# Patient Record
Sex: Female | Born: 1955
Health system: Southern US, Community
[De-identification: ages and names within clinical notes are randomized; demographics above are authoritative.]

## PROBLEM LIST (undated history)

## (undated) DIAGNOSIS — E039 Hypothyroidism, unspecified: Secondary | ICD-10-CM

## (undated) DIAGNOSIS — M1990 Unspecified osteoarthritis, unspecified site: Secondary | ICD-10-CM

## (undated) DIAGNOSIS — N6091 Unspecified benign mammary dysplasia of right breast: Principal | ICD-10-CM

## (undated) DIAGNOSIS — R112 Nausea with vomiting, unspecified: Secondary | ICD-10-CM

## (undated) DIAGNOSIS — K219 Gastro-esophageal reflux disease without esophagitis: Secondary | ICD-10-CM

## (undated) DIAGNOSIS — IMO0002 Reserved for concepts with insufficient information to code with codable children: Secondary | ICD-10-CM

## (undated) DIAGNOSIS — I10 Essential (primary) hypertension: Secondary | ICD-10-CM

## (undated) DIAGNOSIS — D649 Anemia, unspecified: Secondary | ICD-10-CM

## (undated) DIAGNOSIS — G4730 Sleep apnea, unspecified: Secondary | ICD-10-CM

## (undated) DIAGNOSIS — R519 Headache, unspecified: Secondary | ICD-10-CM

## (undated) DIAGNOSIS — K224 Dyskinesia of esophagus: Secondary | ICD-10-CM

## (undated) DIAGNOSIS — T7840XA Allergy, unspecified, initial encounter: Secondary | ICD-10-CM

## (undated) DIAGNOSIS — Z9889 Other specified postprocedural states: Secondary | ICD-10-CM

## (undated) HISTORY — PX: OTHER SURGICAL HISTORY: SHX169

## (undated) HISTORY — DX: Hypothyroidism, unspecified: E03.9

## (undated) HISTORY — DX: Anemia, unspecified: D64.9

## (undated) HISTORY — PX: KNEE ARTHROSCOPY: SHX127

## (undated) HISTORY — PX: COSMETIC SURGERY: SHX468

## (undated) HISTORY — PX: JOINT REPLACEMENT: SHX530

## (undated) HISTORY — PX: WISDOM TOOTH EXTRACTION: SHX21

## (undated) HISTORY — DX: Unspecified benign mammary dysplasia of right breast: N60.91

## (undated) HISTORY — DX: Reserved for concepts with insufficient information to code with codable children: IMO0002

## (undated) HISTORY — DX: Allergy, unspecified, initial encounter: T78.40XA

---

## 1982-12-16 HISTORY — PX: CHOLECYSTECTOMY: SHX55

## 2002-12-16 HISTORY — PX: GASTRIC BYPASS: SHX52

## 2010-04-30 ENCOUNTER — Emergency Department: Payer: Self-pay | Admitting: Emergency Medicine

## 2010-05-04 ENCOUNTER — Ambulatory Visit: Payer: Self-pay | Admitting: Orthopedic Surgery

## 2010-05-08 ENCOUNTER — Inpatient Hospital Stay: Payer: Self-pay | Admitting: Orthopedic Surgery

## 2010-06-21 ENCOUNTER — Ambulatory Visit: Payer: Self-pay | Admitting: Orthopedic Surgery

## 2011-01-03 ENCOUNTER — Ambulatory Visit: Payer: Self-pay | Admitting: Orthopedic Surgery

## 2011-01-10 ENCOUNTER — Inpatient Hospital Stay: Payer: Self-pay | Admitting: Orthopedic Surgery

## 2011-01-15 LAB — PATHOLOGY REPORT

## 2011-12-17 DIAGNOSIS — C50919 Malignant neoplasm of unspecified site of unspecified female breast: Secondary | ICD-10-CM

## 2011-12-17 HISTORY — DX: Malignant neoplasm of unspecified site of unspecified female breast: C50.919

## 2012-10-30 DIAGNOSIS — R920 Mammographic microcalcification found on diagnostic imaging of breast: Secondary | ICD-10-CM | POA: Insufficient documentation

## 2012-11-15 HISTORY — PX: MM BREAST STEREO BX*L*R/S: HXRAD495

## 2012-11-28 ENCOUNTER — Encounter: Payer: Self-pay | Admitting: General Surgery

## 2012-11-28 DIAGNOSIS — E559 Vitamin D deficiency, unspecified: Secondary | ICD-10-CM | POA: Insufficient documentation

## 2012-11-28 DIAGNOSIS — R920 Mammographic microcalcification found on diagnostic imaging of breast: Secondary | ICD-10-CM

## 2012-11-28 DIAGNOSIS — E039 Hypothyroidism, unspecified: Secondary | ICD-10-CM

## 2012-12-07 ENCOUNTER — Ambulatory Visit: Payer: Self-pay | Admitting: General Surgery

## 2012-12-07 HISTORY — PX: BREAST SURGERY: SHX581

## 2012-12-07 HISTORY — PX: BREAST BIOPSY: SHX20

## 2012-12-08 LAB — PATHOLOGY REPORT

## 2012-12-16 DIAGNOSIS — N6091 Unspecified benign mammary dysplasia of right breast: Secondary | ICD-10-CM

## 2012-12-16 HISTORY — DX: Unspecified benign mammary dysplasia of right breast: N60.91

## 2012-12-16 HISTORY — PX: BREAST MASS EXCISION: SHX1267

## 2013-01-01 ENCOUNTER — Ambulatory Visit: Payer: Self-pay | Admitting: General Surgery

## 2013-01-01 HISTORY — PX: BREAST LUMPECTOMY: SHX2

## 2013-01-04 LAB — PATHOLOGY REPORT

## 2013-06-10 ENCOUNTER — Ambulatory Visit: Payer: Self-pay | Admitting: General Surgery

## 2013-06-14 ENCOUNTER — Encounter: Payer: Self-pay | Admitting: General Surgery

## 2013-06-22 ENCOUNTER — Encounter: Payer: Self-pay | Admitting: General Surgery

## 2013-06-22 ENCOUNTER — Ambulatory Visit (INDEPENDENT_AMBULATORY_CARE_PROVIDER_SITE_OTHER): Payer: 59 | Admitting: General Surgery

## 2013-06-22 VITALS — BP 144/92 | HR 70 | Resp 14 | Ht 67.0 in | Wt 237.0 lb

## 2013-06-22 DIAGNOSIS — N6099 Unspecified benign mammary dysplasia of unspecified breast: Secondary | ICD-10-CM | POA: Insufficient documentation

## 2013-06-22 DIAGNOSIS — N6089 Other benign mammary dysplasias of unspecified breast: Secondary | ICD-10-CM

## 2013-06-22 DIAGNOSIS — R920 Mammographic microcalcification found on diagnostic imaging of breast: Secondary | ICD-10-CM

## 2013-06-22 NOTE — Patient Instructions (Addendum)
Continue self breast exams. Call office for any new breast issues or concerns. Dec with bilateral mammogram and office visit

## 2013-06-22 NOTE — Progress Notes (Signed)
Patient ID: Kathy Mcintosh, female   DOB: Sep 18, 1956, 57 y.o.   MRN: [id number]  Chief Complaint  Patient presents with  . Other    mammogram    HPI FREDRICK DRAY is a 57 y.o. female here today following up from an right breast mammogram done on 06/10/13 cat 3. Patient dose perform self breast checks and get regular mammograms. No new breast issues. Continues to take her Tamoxifen. The patient has gained 7 pounds from her last visit. 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 HPI  Past Medical History  Diagnosis Date  . Hypothyroidism     Past Surgical History  Procedure Laterality Date  . Cholecystectomy  1984  . Knee arthroscopy  Right 2011, Left 2012  . Gastric bypass  2004  . Mm breast stereo bx*l*r/s  Dec 2013  . Breast mass excision Right Jan 2014    Family History  Problem Relation Age of Onset  . Lung cancer Mother   . Cervical cancer Mother   . Cervical cancer Sister   . Breast cancer Paternal Aunt 60  . Breast cancer Sister     Social History History  Substance Use Topics  . Smoking status: Never Smoker   . Smokeless tobacco: Never Used  . Alcohol Use: Yes    Allergies  Allergen Reactions  . Sulfa Antibiotics Nausea And Vomiting    Current Outpatient Prescriptions  Medication Sig Dispense Refill  . aspirin 81 MG tablet Take 81 mg by mouth daily.      Marland Kitchen b complex vitamins tablet Take 1 tablet by mouth daily.      . cholecalciferol (VITAMIN D) 1000 UNITS tablet Take 1,000 Units by mouth daily.      Marland Kitchen levothyroxine (SYNTHROID, LEVOTHROID) 100 MCG tablet Take 100 mcg by mouth daily.      . Multiple Vitamin (MULTIVITAMIN) LIQD Take 5 mLs by mouth daily.      . tamoxifen (NOLVADEX) 20 MG tablet Take 1 tablet by mouth daily.       No current facility-administered medications for this visit.    Review of Systems Review of Systems  Constitutional: Negative.   Respiratory: Negative.   Cardiovascular:  Negative.     Blood pressure 144/92, pulse 70, resp. rate 14, height 5\' 7"  (1.702 m), weight 237 lb (107.502 kg), last menstrual period 11/28/1990.  Physical Exam Physical Exam  Constitutional: She is oriented to person, place, and time. She appears well-developed and well-nourished.  Cardiovascular: Normal rate and regular rhythm.   Pulmonary/Chest: Effort normal and breath sounds normal. Right breast exhibits no inverted nipple, no mass, no nipple discharge, no skin change and no tenderness. Left breast exhibits no inverted nipple, no mass, no nipple discharge, no skin change and no tenderness.  Lymphadenopathy:    She has no cervical adenopathy.    She has no axillary adenopathy.  Neurological: She is alert and oriented to person, place, and time.  Skin: Skin is warm and dry.  Right breast well healed incision from 8-12 o'clock around the edge of the areola. Data Reviewed Right breast mammogram dated June 10, 2012 was reviewed. Postbiopsy changes identified.  Assessment    Benign exam status post reexcision for ADH.     Plan    All in all the patient is tolerating tamoxifen well. She was advised that not all weight gain is medication related.  We'll plan for bilateral diagnostic mammograms and office visit  in 6 months.        Earline Mayotte 06/22/2013, 8:59 PM

## 2013-07-06 ENCOUNTER — Encounter: Payer: Self-pay | Admitting: General Surgery

## 2013-07-23 DIAGNOSIS — N814 Uterovaginal prolapse, unspecified: Secondary | ICD-10-CM | POA: Insufficient documentation

## 2013-09-15 HISTORY — PX: ABDOMINAL HYSTERECTOMY: SHX81

## 2013-09-21 DIAGNOSIS — I10 Essential (primary) hypertension: Secondary | ICD-10-CM | POA: Insufficient documentation

## 2013-09-21 DIAGNOSIS — E669 Obesity, unspecified: Secondary | ICD-10-CM | POA: Insufficient documentation

## 2013-09-21 DIAGNOSIS — K219 Gastro-esophageal reflux disease without esophagitis: Secondary | ICD-10-CM | POA: Insufficient documentation

## 2013-09-21 DIAGNOSIS — E039 Hypothyroidism, unspecified: Secondary | ICD-10-CM | POA: Insufficient documentation

## 2013-12-13 ENCOUNTER — Ambulatory Visit: Payer: Self-pay | Admitting: General Surgery

## 2013-12-15 ENCOUNTER — Ambulatory Visit: Payer: Self-pay | Admitting: General Surgery

## 2013-12-30 ENCOUNTER — Ambulatory Visit: Payer: Self-pay | Admitting: General Surgery

## 2014-01-10 ENCOUNTER — Other Ambulatory Visit: Payer: Self-pay | Admitting: General Surgery

## 2014-02-03 ENCOUNTER — Ambulatory Visit: Payer: Self-pay | Admitting: General Surgery

## 2014-02-16 ENCOUNTER — Ambulatory Visit: Payer: Self-pay | Admitting: General Surgery

## 2014-03-10 ENCOUNTER — Encounter: Payer: Self-pay | Admitting: *Deleted

## 2014-03-28 ENCOUNTER — Encounter: Payer: Self-pay | Admitting: General Surgery

## 2014-03-31 ENCOUNTER — Ambulatory Visit (INDEPENDENT_AMBULATORY_CARE_PROVIDER_SITE_OTHER): Payer: 59 | Admitting: General Surgery

## 2014-03-31 ENCOUNTER — Encounter: Payer: Self-pay | Admitting: General Surgery

## 2014-03-31 VITALS — BP 140/72 | HR 76 | Resp 14 | Ht 67.0 in | Wt 222.0 lb

## 2014-03-31 DIAGNOSIS — N6089 Other benign mammary dysplasias of unspecified breast: Secondary | ICD-10-CM

## 2014-03-31 DIAGNOSIS — N6099 Unspecified benign mammary dysplasia of unspecified breast: Secondary | ICD-10-CM

## 2014-03-31 NOTE — Patient Instructions (Signed)
Patient to return in one year bilateral diagnotic mammogram.  

## 2014-03-31 NOTE — Progress Notes (Addendum)
Patient ID: Kathy Mcintosh, female   DOB: Feb 04, 1956, 58 y.o.   MRN: [id number]  Chief Complaint  Patient presents with  . Follow-up    mammogram    HPI Kathy Mcintosh is a 58 y.o. female who presents for a breast evaluation. The most recent mammogram was done on 03/25/14.Patient does perform regular self breast checks and gets regular mammograms done.  The patient reports tolerating her tamoxifen without ill effect.   HPI  Past Medical History  Diagnosis Date  . Hypothyroidism     Past Surgical History  Procedure Laterality Date  . Cholecystectomy  1984  . Knee arthroscopy  Right 2011, Left 2012  . Gastric bypass  2004  . Mm breast stereo bx*l*r/s  Dec 2013  . Breast mass excision Right Jan 2014  . Abdominal hysterectomy  10/14  . 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 Negative margins.l    Family History  Problem Relation Age of Onset  . Lung cancer Mother   . Cervical cancer Mother   . Cervical cancer Sister   . Breast cancer Paternal Aunt 15  . Breast cancer Sister     Social History History  Substance Use Topics  . Smoking status: Never Smoker   . Smokeless tobacco: Never Used  . Alcohol Use: Yes    Allergies  Allergen Reactions  . Sulfa Antibiotics Nausea And Vomiting    Current Outpatient Prescriptions  Medication Sig Dispense Refill  . aspirin 81 MG tablet Take 81 mg by mouth daily.      Marland Kitchen b complex vitamins tablet Take 1 tablet by mouth daily.      . cholecalciferol (VITAMIN D) 1000 UNITS tablet Take 1,000 Units by mouth daily.      Marland Kitchen levothyroxine (SYNTHROID, LEVOTHROID) 100 MCG tablet Take 100 mcg by mouth daily.      . Multiple Vitamin (MULTIVITAMIN) LIQD Take 5 mLs by mouth daily.      Marland Kitchen oxybutynin (DITROPAN) 5 MG tablet Take 5 mg by mouth daily.      [name] (STOOL SOFTENER & LAXATIVE PO) Take 1 capsule by mouth daily.      . tamoxifen (NOLVADEX) 20 MG tablet TAKE 1 TABLET  BY MOUTH EVERY DAY  30 tablet  8   No current facility-administered medications for this visit.    Review of Systems Review of Systems  Constitutional: Negative.   Respiratory: Negative.   Cardiovascular: Negative.     Blood pressure 140/72, pulse 76, resp. rate 14, height 5\' 7"  (1.702 m), weight 222 lb (100.699 kg), last menstrual period 11/28/1990.  Physical Exam Physical Exam  Constitutional: She is oriented to person, place, and time. She appears well-developed and well-nourished.  Eyes: Conjunctivae are normal.  Neck: Neck supple.  Cardiovascular: Normal rate, regular rhythm and normal heart sounds.   Pulmonary/Chest: Effort normal and breath sounds normal. Right breast exhibits no inverted nipple, no mass, no nipple discharge, no skin change and no tenderness. Left breast exhibits no inverted nipple, no mass, no nipple discharge, no skin change and no tenderness.  Well healed scar from 8 -11 around alveolar right breast   Lymphadenopathy:    She has no cervical adenopathy.    She has no axillary adenopathy.  Neurological: She is alert and oriented to person, place, and time.  Skin: Skin is warm and dry.    Data Reviewed [facility] mammogram dated March 25, 2014 showed no interval  change. BI-RAD-2. These films were reviewed and compared to pre-biopsy films. Previously identified microcalcifications are no longer evident.  Assessment    Stable breast exam, passed biopsy showing small foci of ADH located within a noncentral papilloma.     Plan    The indication for ongoing tamoxifen therapy for chemotherapy prevention was reviewed. The patient is amenable to continuing therapy. We'll plan for follow up examination with bilateral diagnostic mammogram in one year.     PCP: Mitchel Honour Alie Moudy 04/01/2014, 5:48 AM

## 2014-04-01 ENCOUNTER — Encounter: Payer: Self-pay | Admitting: General Surgery

## 2014-08-29 LAB — TSH: TSH: 3.14 u[IU]/mL (ref 0.41–5.90)

## 2014-08-29 LAB — CBC AND DIFFERENTIAL
HEMATOCRIT: 37 % (ref 36–46)
Hemoglobin: 12.5 g/dL (ref 12.0–16.0)
NEUTROS ABS: 4 /uL
Platelets: 222 10*3/uL (ref 150–399)
WBC: 6.8 10^3/mL

## 2014-08-29 LAB — BASIC METABOLIC PANEL
BUN: 13 mg/dL (ref 4–21)
Creatinine: 0.6 mg/dL (ref 0.5–1.1)
GLUCOSE: 91 mg/dL
POTASSIUM: 4.3 mmol/L (ref 3.4–5.3)
SODIUM: 143 mmol/L (ref 137–147)

## 2014-08-29 LAB — LIPID PANEL
Cholesterol: 143 mg/dL (ref 0–200)
HDL: 55 mg/dL (ref 35–70)
LDL CALC: 71 mg/dL
LDL/HDL RATIO: 1.3
Triglycerides: 85 mg/dL (ref 40–160)

## 2014-08-29 LAB — HEPATIC FUNCTION PANEL
ALT: 10 U/L (ref 7–35)
AST: 14 U/L (ref 13–35)
Alkaline Phosphatase: 78 U/L (ref 25–125)
BILIRUBIN, TOTAL: 0.4 mg/dL

## 2014-10-17 ENCOUNTER — Encounter: Payer: Self-pay | Admitting: General Surgery

## 2014-12-13 ENCOUNTER — Other Ambulatory Visit: Payer: Self-pay | Admitting: General Surgery

## 2015-03-28 ENCOUNTER — Encounter: Payer: Self-pay | Admitting: General Surgery

## 2015-04-04 ENCOUNTER — Encounter: Payer: Self-pay | Admitting: General Surgery

## 2015-04-04 ENCOUNTER — Ambulatory Visit (INDEPENDENT_AMBULATORY_CARE_PROVIDER_SITE_OTHER): Payer: 59 | Admitting: General Surgery

## 2015-04-04 VITALS — BP 132/78 | HR 77 | Resp 13 | Ht 67.0 in | Wt 252.0 lb

## 2015-04-04 DIAGNOSIS — N6310 Unspecified lump in the right breast, unspecified quadrant: Secondary | ICD-10-CM | POA: Insufficient documentation

## 2015-04-04 DIAGNOSIS — N62 Hypertrophy of breast: Secondary | ICD-10-CM | POA: Diagnosis not present

## 2015-04-04 DIAGNOSIS — N6001 Solitary cyst of right breast: Secondary | ICD-10-CM

## 2015-04-04 DIAGNOSIS — N6009 Solitary cyst of unspecified breast: Secondary | ICD-10-CM | POA: Insufficient documentation

## 2015-04-04 DIAGNOSIS — N6099 Unspecified benign mammary dysplasia of unspecified breast: Secondary | ICD-10-CM

## 2015-04-04 NOTE — Patient Instructions (Signed)
Continue self breast exams. Call office for any new breast issues or concerns. 

## 2015-04-04 NOTE — Progress Notes (Signed)
Patient ID: Kathy Mcintosh, female   DOB: 1956-10-21, 59 y.o.   MRN: [id number]  Chief Complaint  Patient presents with  . Follow-up    mammogram     HPI Kathy Mcintosh is a 59 y.o. female who presents for a breast evaluation. The most recent mammogram was done on 03/28/15. Patient does perform regular self breast checks and gets regular mammograms done. She doesn't report any breast problems since last seen. She does report that she was bit on her right breast by her horse in July 2015. It healed up but did take a while.  The patient reports that the skin was not broken, but she had extensive ecchymosis covering the entire breast that took several weeks to resolve. The bite occurred in the upper inner quadrant.  HPI  Past Medical History  Diagnosis Date  . Hypothyroidism     Past Surgical History  Procedure Laterality Date  . Cholecystectomy  1984  . Knee arthroscopy  Right 2011, Left 2012  . Gastric bypass  2004  . Mm breast stereo bx*l*r/s  Dec 2013  . Breast mass excision Right Jan 2014  . Abdominal hysterectomy  10/14  . 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 Negative margins.l    Family History  Problem Relation Age of Onset  . Lung cancer Mother   . Cervical cancer Mother   . Cervical cancer Sister   . Breast cancer Paternal Aunt 36  . Breast cancer Sister     Social History History  Substance Use Topics  . Smoking status: Never Smoker   . Smokeless tobacco: Never Used  . Alcohol Use: Yes    Allergies  Allergen Reactions  . Sulfa Antibiotics Nausea And Vomiting    Current Outpatient Prescriptions  Medication Sig Dispense Refill  . aspirin 81 MG tablet Take 81 mg by mouth daily.    Marland Kitchen b complex vitamins tablet Take 1 tablet by mouth daily.    . cholecalciferol (VITAMIN D) 1000 UNITS tablet Take 1,000 Units by mouth daily.    Marland Kitchen levothyroxine (SYNTHROID, LEVOTHROID) 100 MCG tablet Take 100 mcg by mouth daily.     . Multiple Vitamin (MULTIVITAMIN) LIQD Take 5 mLs by mouth daily.    . nitrofurantoin, macrocrystal-monohydrate, (MACROBID) 100 MG capsule Take 50 mg by mouth daily.    Marland Kitchen oxybutynin (DITROPAN) 5 MG tablet Take 5 mg by mouth daily.    [name] (STOOL SOFTENER & LAXATIVE PO) Take 1 capsule by mouth daily.    . tamoxifen (NOLVADEX) 20 MG tablet TAKE 1 TABLET BY MOUTH EVERY DAY 30 tablet 8   No current facility-administered medications for this visit.    Review of Systems Review of Systems  Constitutional: Negative.   Respiratory: Negative.   Cardiovascular: Negative.     Blood pressure 132/78, pulse 77, resp. rate 13, height 5\' 7"  (1.702 m), weight 252 lb (114.306 kg), last menstrual period 11/28/1990.  Physical Exam Physical Exam  Constitutional: She appears well-developed and well-nourished.  Eyes: Conjunctivae are normal. No scleral icterus.  Neck: Neck supple.  Cardiovascular: Normal rate, regular rhythm and normal heart sounds.   Pulmonary/Chest: Effort normal and breath sounds normal. Right breast exhibits no inverted nipple, no mass, no nipple discharge, no skin change and no tenderness. Left breast exhibits no inverted nipple, no mass, no nipple discharge, no skin change and no tenderness.    Right breast well healed circumareolar incision from  7-11 o'clk.   Lymphadenopathy:    She has no cervical adenopathy.    She has no axillary adenopathy.    Data Reviewed Bilateral mammograms dated 03/28/2015 completed at [facility] were reviewed. No interval change. BI-RADS-2.  Repeat review after clinical exam suggested a focal density in the superior aspect of the right breast corresponding to the area of palpable thickening on exam.  Ultrasound examination of the upper inner quadrant of the right breast was completed. A multilobulated complex cystic lesion with areas of focal acoustic enhancement and shadowing were identified. This measured in maximum  diameter at 0.76 x 1.18 x 1.51 cm. BI-RADS-3.   The patient was amenable to aspiration. This was completed using 1 mL of 1% plain Xylocaine. The area completely resolved on aspiration. The fluid was discarded. The procedure was well tolerated.  Assessment    Traumatic breast cyst.  History ADH, good tolerance of tamoxifen therapy.    Plan    We will plan for a follow-up examination with bilateral diagnostic mammograms in one year.    PCP: Philemon Kingdom 04/04/2015, 8:32 PM

## 2015-04-07 ENCOUNTER — Other Ambulatory Visit: Payer: 59

## 2015-04-07 ENCOUNTER — Other Ambulatory Visit: Payer: Self-pay | Admitting: General Surgery

## 2015-04-07 DIAGNOSIS — N6001 Solitary cyst of right breast: Secondary | ICD-10-CM

## 2015-04-07 NOTE — Op Note (Signed)
PATIENT NAME:  Kathy Mcintosh, PINKNEY MR#:  [id number] DATE OF BIRTH:  [birth year]  DATE OF PROCEDURE:  01/01/2013  PREOPERATIVE DIAGNOSIS: Atypical ductal hyperplasia, right breast.   POSTOPERATIVE DIAGNOSIS: Atypical ductal hyperplasia, right breast.   OPERATIVE PROCEDURE: Wire localization and biopsy of the right breast.   OPERATING SURGEON: Robert Bellow, MD.  ANESTHESIA: General under Dr. Kayleen Memos, Marcaine 0.5% with 1:200,000 units epinephrine, 30 mL local infiltration.   ESTIMATED BLOOD LOSS: Minimal.   CLINICAL NOTE: This 59 year old woman had previously underwent a stereotactic biopsy for microcalcifications. A foci of atypical ductal hyperplasia was identified. She was admitted for wide local excision.   OPERATIVE NOTE: With the patient under adequate general anesthesia, the breast was prepped with ChloraPrep and draped. The area of concern was identified on ultrasound and transfixed with a Kopan's wire. A circumareolar incision was made after instillation of local anesthesia. The circumareolar incision was completed from the 7 to 11 o'clock position. The skin and subcutaneous tissue was divided sharply and hemostasis achieved with electrocautery. The localizing wire was identified. A 3 x 3 x 4 cm block of tissue was excised encompassing the wire and the adjacent tissue. This was orientated and the specimen radiograph confirmed the previously placed clip was included. Margins were assessed grossly by Pathology and the closest was 5 mm from the biopsy cavity. The adipose layer was approximated with interrupted 2-0          Vicryl figure-of-eight sutures in multiple layers. The skin was closed with a running 4-0 Vicryl subcuticular suture. Benzoin, Steri-Strips, Telfa, and Tegaderm dressing applied.   The patient tolerated the procedure well and was taken to the recovery room in stable condition.   ____________________________ Robert Bellow, MD jwb:jm D: 01/01/2013 21:55:29  ET T: 01/02/2013 11:54:22 ET JOB#: [id number]  cc: Robert Bellow, MD, <Dictator> Richard L. Rosanna Randy, MD Quinnley Colasurdo Amedeo Kinsman MD ELECTRONICALLY SIGNED 01/04/2013 20:58

## 2015-08-19 ENCOUNTER — Other Ambulatory Visit: Payer: Self-pay | Admitting: Family Medicine

## 2015-10-08 ENCOUNTER — Other Ambulatory Visit: Payer: Self-pay | Admitting: Orthopedic Surgery

## 2015-10-18 ENCOUNTER — Other Ambulatory Visit: Payer: Self-pay | Admitting: General Surgery

## 2015-10-25 DIAGNOSIS — G43909 Migraine, unspecified, not intractable, without status migrainosus: Secondary | ICD-10-CM | POA: Insufficient documentation

## 2015-10-25 DIAGNOSIS — N3281 Overactive bladder: Secondary | ICD-10-CM | POA: Insufficient documentation

## 2015-10-25 DIAGNOSIS — E538 Deficiency of other specified B group vitamins: Secondary | ICD-10-CM | POA: Insufficient documentation

## 2015-10-25 DIAGNOSIS — E7800 Pure hypercholesterolemia, unspecified: Secondary | ICD-10-CM | POA: Insufficient documentation

## 2015-10-25 DIAGNOSIS — E559 Vitamin D deficiency, unspecified: Secondary | ICD-10-CM | POA: Insufficient documentation

## 2015-10-25 DIAGNOSIS — R413 Other amnesia: Secondary | ICD-10-CM | POA: Insufficient documentation

## 2015-10-25 DIAGNOSIS — M1990 Unspecified osteoarthritis, unspecified site: Secondary | ICD-10-CM | POA: Insufficient documentation

## 2015-10-25 DIAGNOSIS — K219 Gastro-esophageal reflux disease without esophagitis: Secondary | ICD-10-CM | POA: Insufficient documentation

## 2015-10-25 DIAGNOSIS — IMO0002 Reserved for concepts with insufficient information to code with codable children: Secondary | ICD-10-CM | POA: Insufficient documentation

## 2015-10-25 DIAGNOSIS — G4700 Insomnia, unspecified: Secondary | ICD-10-CM | POA: Insufficient documentation

## 2015-10-25 DIAGNOSIS — I10 Essential (primary) hypertension: Secondary | ICD-10-CM | POA: Insufficient documentation

## 2015-10-30 ENCOUNTER — Ambulatory Visit (INDEPENDENT_AMBULATORY_CARE_PROVIDER_SITE_OTHER): Payer: 59 | Admitting: Family Medicine

## 2015-10-30 ENCOUNTER — Encounter: Payer: Self-pay | Admitting: Family Medicine

## 2015-10-30 VITALS — BP 122/80 | HR 68 | Temp 98.8°F | Resp 16 | Ht 66.0 in | Wt 256.0 lb

## 2015-10-30 DIAGNOSIS — R319 Hematuria, unspecified: Secondary | ICD-10-CM | POA: Diagnosis not present

## 2015-10-30 DIAGNOSIS — N3281 Overactive bladder: Secondary | ICD-10-CM

## 2015-10-30 DIAGNOSIS — Z0000 Encounter for general adult medical examination without abnormal findings: Secondary | ICD-10-CM

## 2015-10-30 DIAGNOSIS — N390 Urinary tract infection, site not specified: Secondary | ICD-10-CM | POA: Diagnosis not present

## 2015-10-30 DIAGNOSIS — Z23 Encounter for immunization: Secondary | ICD-10-CM | POA: Diagnosis not present

## 2015-10-30 LAB — POCT URINALYSIS DIPSTICK
BILIRUBIN UA: NEGATIVE
Glucose, UA: NEGATIVE
Ketones, UA: NEGATIVE
NITRITE UA: NEGATIVE
PH UA: 5
Protein, UA: NEGATIVE
SPEC GRAV UA: 1.015
Urobilinogen, UA: 0.2

## 2015-10-30 NOTE — Progress Notes (Signed)
Patient ID: Kathy Mcintosh, female   DOB: 08-Sep-1956, 59 y.o.   MRN: [id number]       Patient: Kathy Mcintosh, Female    DOB: 25-Feb-1956, 59 y.o.   MRN: [id number] Visit Date: 10/30/2015  Today's Provider: Wilhemena Durie, MD   Chief Complaint  Patient presents with  . Annual Exam   Subjective:    Annual physical exam Kathy Mcintosh is a 59 y.o. female who presents today for health maintenance and complete physical. She feels well. She reports exercising twice a week. She reports she is sleeping well.  ----------------------------------------------------------------- Mammogram- 03/28/15 EKG 01/06/12 Pap- Per pt that she had one at [facility] last year Colonoscopy- never  Review of Systems  Constitutional: Negative.   HENT: Negative.   Eyes: Negative.   Respiratory: Negative.   Cardiovascular: Negative.   Gastrointestinal: Negative.   Endocrine: Negative.   Genitourinary: Negative.   Musculoskeletal: Negative.   Skin: Negative.   Allergic/Immunologic: Negative.   Neurological: Negative.   Hematological: Negative.   Psychiatric/Behavioral: Negative.     Social History She  reports that she has never smoked. She has never used smokeless tobacco. She reports that she drinks alcohol. She reports that she does not use illicit drugs. Social History   Social History  . Marital Status: Married    Spouse Name: N/A  . Number of Children: N/A  . Years of Education: N/A   Social History Main Topics  . Smoking status: Never Smoker   . Smokeless tobacco: Never Used  . Alcohol Use: Yes  . Drug Use: No  . Sexual Activity: Not Asked   Other Topics Concern  . None   Social History Narrative    Patient Active Problem List   Diagnosis Date Noted  . Arthritis 10/25/2015  . Age-related memory disorder 10/25/2015  . Bladder cystocele 10/25/2015  . Acid reflux 10/25/2015  . Hypercholesteremia 10/25/2015  . BP (high blood pressure) 10/25/2015  . Cannot sleep 10/25/2015    . Headache, migraine 10/25/2015  . 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 B12 deficiency 10/25/2015  . Avitaminosis D 10/25/2015  . Breast mass, right 04/04/2015  . Breast cyst 04/04/2015  . Adiposity 09/21/2013  . Adult hypothyroidism 09/21/2013  . Atypical ductal hyperplasia, breast 06/22/2013  . Hypothyroidism 11/28/2012    Class: Chronic  . Vitamin D deficiency disease 11/28/2012  . Abnormal mammogram with microcalcification 10/30/2012    Past Surgical History  Procedure Laterality Date  . Cholecystectomy  1984  . Knee arthroscopy  Right 2011, Left 2012  . Gastric bypass  2004  . Mm breast stereo bx*l*r/s  Dec 2013  . Breast mass excision Right Jan 2014  . Abdominal hysterectomy  10/14  . 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 Negative margins.l    Family History  Family Status  Relation Status Death Age  . Mother Deceased   . Sister Alive   . Sister Alive   . Father Deceased 29  . Daughter Alive   . Son Alive    Her family history includes Asthma in her daughter and mother; Breast cancer in her sister; Breast cancer (age of onset: 48) in her paternal aunt; COPD in her father and mother; Cervical cancer in her mother and sister; Dementia in her mother; Heart disease in her father; Heart failure in her father; Hypertension in her mother; Lung cancer in her mother.    Allergies  Allergen Reactions  .  Sulfa Antibiotics Nausea And Vomiting    Previous Medications   ASPIRIN 81 MG TABLET    Take by mouth.   B COMPLEX VITAMINS PO    Take by mouth.   B COMPLEX VITAMINS TABLET    Take 1 tablet by mouth daily.   CHOLECALCIFEROL (VITAMIN D) 1000 UNITS TABLET    Take 1,000 Units by mouth daily.   CYANOCOBALAMIN (,VITAMIN B-12,) 1000 MCG/ML INJECTION    CYANOCOBALAMIN, 1000MCG/ML (Injection Solution) - Historical Medication  1 ml SQ once a month (1000 MCG/ML) Active   LEVOTHYROXINE (SYNTHROID, LEVOTHROID) 100 MCG TABLET     Take 100 mcg by mouth daily.   MIRABEGRON ER (MYRBETRIQ) 25 MG TB24 TABLET    Take by mouth.   MULTIPLE VITAMIN (MULTIVITAMIN) LIQD    Take 5 mLs by mouth daily.   NAPROXEN (NAPROSYN) 500 MG TABLET    Take by mouth.   NITROFURANTOIN (MACRODANTIN) 50 MG CAPSULE    TAKE ONE CAPSULE BY MOUTH EVERY DAY   NITROFURANTOIN, MACROCRYSTAL-MONOHYDRATE, (MACROBID) 100 MG CAPSULE    Take 50 mg by mouth daily.   OXYBUTYNIN (DITROPAN) 5 MG TABLET    Take 5 mg by mouth daily.   SENNOSIDES-DOCUSATE SODIUM (STOOL SOFTENER & LAXATIVE PO)    Take 1 capsule by mouth daily.   SOLIFENACIN (VESICARE) 5 MG TABLET    Take by mouth.   TAMOXIFEN (NOLVADEX) 20 MG TABLET    TAKE 1 TABLET BY MOUTH EVERY DAY    Patient Care Team: Jerrol Banana., MD as PCP - General (Family Medicine) Robert Bellow, MD (General Surgery)     Objective:   Vitals: LMP 11/28/1990   Physical Exam  Constitutional: She is oriented to person, place, and time. She appears well-developed and well-nourished.  Obese white female in no acute distress.  HENT:  Head: Normocephalic and atraumatic.  Right Ear: External ear normal.  Left Ear: External ear normal.  Nose: Nose normal.  Mouth/Throat: Oropharynx is clear and moist.  Dentures in place.  Eyes: Conjunctivae and EOM are normal. Pupils are equal, round, and reactive to light.  Neck: Neck supple.  Cardiovascular: Normal rate, regular rhythm, normal heart sounds and intact distal pulses.   Pulmonary/Chest: Effort normal.  Abdominal: Soft.  Musculoskeletal: Normal range of motion.  Neurological: She is alert and oriented to person, place, and time.  Skin: Skin is warm and dry.  Psychiatric: She has a normal mood and affect. Her behavior is normal. Judgment and thought content normal.     Depression Screen No flowsheet data found.    Assessment & Plan:     Routine Health Maintenance and Physical Exam  Exercise Activities and Dietary recommendations Goals    None       Immunization History  Administered Date(s) Administered  . Td 09/25/2004    Health Maintenance  Topic Date Due  . Hepatitis C Screening  [birth year]  . HIV Screening  02/15/1970  . PAP SMEAR  02/16/1976  . COLONOSCOPY  02/15/2005  . TETANUS/TDAP  09/25/2014  . ZOSTAVAX  02/16/2015  . INFLUENZA VACCINE  07/17/2015  . MAMMOGRAM  03/27/2017      Discussed health benefits of physical activity, and encouraged her to engage in regular exercise appropriate for her age and condition.   UTI Being treated. Overactive bladder Myrbetriq works for patient but is not covered by insurance. She has absolutely failed and got no relief from generic oxybutynin. At this time we'll try Vesicare 5 mg daily, if  this does not work we'll go to 10 mg after 3-4 weeks. She is given samples this morning. Morbid obesity/possible OSA Patient snores but has no documented apnea. --------------------------------------------------------------------

## 2015-10-31 LAB — URINE CULTURE: ORGANISM ID, BACTERIA: NO GROWTH

## 2015-11-02 ENCOUNTER — Telehealth: Payer: Self-pay | Admitting: Family Medicine

## 2015-11-02 NOTE — Telephone Encounter (Signed)
Pt stated she received a call from our office and thinks it might be about results. Pt request a nurse to return her call. Thanks TNP

## 2015-11-02 NOTE — Telephone Encounter (Signed)
Pt advised-aa 

## 2015-11-09 LAB — CBC WITH DIFFERENTIAL/PLATELET
BASOS: 0 %
Basophils Absolute: 0 10*3/uL (ref 0.0–0.2)
EOS (ABSOLUTE): 0.1 10*3/uL (ref 0.0–0.4)
EOS: 2 %
HEMOGLOBIN: 13.2 g/dL (ref 11.1–15.9)
Hematocrit: 38.4 % (ref 34.0–46.6)
IMMATURE GRANS (ABS): 0 10*3/uL (ref 0.0–0.1)
IMMATURE GRANULOCYTES: 0 %
Lymphocytes Absolute: 2.1 10*3/uL (ref 0.7–3.1)
Lymphs: 26 %
MCH: 30.7 pg (ref 26.6–33.0)
MCHC: 34.4 g/dL (ref 31.5–35.7)
MCV: 89 fL (ref 79–97)
Monocytes Absolute: 0.6 10*3/uL (ref 0.1–0.9)
Monocytes: 7 %
Neutrophils Absolute: 5.4 10*3/uL (ref 1.4–7.0)
Neutrophils: 65 %
PLATELETS: 247 10*3/uL (ref 150–379)
RBC: 4.3 x10E6/uL (ref 3.77–5.28)
RDW: 13.4 % (ref 12.3–15.4)
WBC: 8.3 10*3/uL (ref 3.4–10.8)

## 2015-11-09 LAB — COMPREHENSIVE METABOLIC PANEL
A/G RATIO: 1.5 (ref 1.1–2.5)
ALBUMIN: 3.8 g/dL (ref 3.6–4.8)
ALT: 10 IU/L (ref 0–32)
AST: 16 IU/L (ref 0–40)
Alkaline Phosphatase: 92 IU/L (ref 39–117)
BILIRUBIN TOTAL: 0.3 mg/dL (ref 0.0–1.2)
BUN / CREAT RATIO: 15 (ref 11–26)
BUN: 10 mg/dL (ref 8–27)
CO2: 24 mmol/L (ref 18–29)
CREATININE: 0.65 mg/dL (ref 0.57–1.00)
Calcium: 9.3 mg/dL (ref 8.7–10.3)
Chloride: 107 mmol/L — ABNORMAL HIGH (ref 97–106)
GFR calc Af Amer: 112 mL/min/{1.73_m2} (ref >59)
GFR calc non Af Amer: 97 mL/min/{1.73_m2} (ref >59)
Globulin, Total: 2.6 g/dL (ref 1.5–4.5)
Glucose: 94 mg/dL (ref 65–99)
POTASSIUM: 4.9 mmol/L (ref 3.5–5.2)
SODIUM: 147 mmol/L — AB (ref 136–144)
TOTAL PROTEIN: 6.4 g/dL (ref 6.0–8.5)

## 2015-11-09 LAB — LIPID PANEL WITH LDL/HDL RATIO
CHOLESTEROL TOTAL: 141 mg/dL (ref 100–199)
HDL: 52 mg/dL (ref >39)
LDL CALC: 64 mg/dL (ref 0–99)
LDl/HDL Ratio: 1.2 ratio units (ref 0.0–3.2)
Triglycerides: 124 mg/dL (ref 0–149)
VLDL CHOLESTEROL CAL: 25 mg/dL (ref 5–40)

## 2015-11-09 LAB — TSH: TSH: 4.02 u[IU]/mL (ref 0.450–4.500)

## 2015-11-10 NOTE — Progress Notes (Signed)
Advised  ED 

## 2015-11-20 ENCOUNTER — Encounter (HOSPITAL_BASED_OUTPATIENT_CLINIC_OR_DEPARTMENT_OTHER): Payer: Self-pay | Admitting: *Deleted

## 2015-11-24 ENCOUNTER — Ambulatory Visit (HOSPITAL_BASED_OUTPATIENT_CLINIC_OR_DEPARTMENT_OTHER): Payer: 59 | Admitting: Anesthesiology

## 2015-11-24 ENCOUNTER — Encounter (HOSPITAL_BASED_OUTPATIENT_CLINIC_OR_DEPARTMENT_OTHER): Payer: Self-pay | Admitting: *Deleted

## 2015-11-24 ENCOUNTER — Ambulatory Visit (HOSPITAL_BASED_OUTPATIENT_CLINIC_OR_DEPARTMENT_OTHER)
Admission: RE | Admit: 2015-11-24 | Discharge: 2015-11-24 | Disposition: A | Discharge status: Home / Self Care | Payer: 59 | Source: Ambulatory Visit | Attending: Orthopedic Surgery | Admitting: Orthopedic Surgery

## 2015-11-24 ENCOUNTER — Encounter (HOSPITAL_BASED_OUTPATIENT_CLINIC_OR_DEPARTMENT_OTHER)
Admission: RE | Disposition: A | Discharge status: Home / Self Care | Payer: Self-pay | Source: Ambulatory Visit | Attending: Orthopedic Surgery

## 2015-11-24 DIAGNOSIS — E039 Hypothyroidism, unspecified: Secondary | ICD-10-CM | POA: Insufficient documentation

## 2015-11-24 DIAGNOSIS — Z803 Family history of malignant neoplasm of breast: Secondary | ICD-10-CM | POA: Insufficient documentation

## 2015-11-24 DIAGNOSIS — Z90710 Acquired absence of both cervix and uterus: Secondary | ICD-10-CM | POA: Diagnosis not present

## 2015-11-24 DIAGNOSIS — Z79810 Long term (current) use of selective estrogen receptor modulators (SERMs): Secondary | ICD-10-CM | POA: Insufficient documentation

## 2015-11-24 DIAGNOSIS — D3610 Benign neoplasm of peripheral nerves and autonomic nervous system, unspecified: Secondary | ICD-10-CM | POA: Insufficient documentation

## 2015-11-24 DIAGNOSIS — Z7982 Long term (current) use of aspirin: Secondary | ICD-10-CM | POA: Diagnosis not present

## 2015-11-24 DIAGNOSIS — C50911 Malignant neoplasm of unspecified site of right female breast: Secondary | ICD-10-CM | POA: Diagnosis not present

## 2015-11-24 DIAGNOSIS — Z882 Allergy status to sulfonamides status: Secondary | ICD-10-CM | POA: Insufficient documentation

## 2015-11-24 DIAGNOSIS — Z9884 Bariatric surgery status: Secondary | ICD-10-CM | POA: Diagnosis not present

## 2015-11-24 DIAGNOSIS — R2232 Localized swelling, mass and lump, left upper limb: Secondary | ICD-10-CM | POA: Diagnosis present

## 2015-11-24 HISTORY — DX: Dyskinesia of esophagus: K22.4

## 2015-11-24 HISTORY — PX: MASS EXCISION: SHX2000

## 2015-11-24 SURGERY — EXCISION MASS
Anesthesia: General | Site: Finger | Laterality: Bilateral

## 2015-11-24 MED ORDER — MIDAZOLAM HCL 2 MG/2ML IJ SOLN
INTRAMUSCULAR | Status: AC
  Filled 2015-11-24: qty 2

## 2015-11-24 MED ORDER — FENTANYL CITRATE (PF) 100 MCG/2ML IJ SOLN
50.0000 ug | INTRAMUSCULAR | Status: DC | PRN
  Administered 2015-11-24: 50 ug via INTRAVENOUS
  Administered 2015-11-24: 100 ug via INTRAVENOUS

## 2015-11-24 MED ORDER — CHLORHEXIDINE GLUCONATE 4 % EX LIQD: 60.0000 mL | Freq: Once | CUTANEOUS | Status: DC

## 2015-11-24 MED ORDER — FENTANYL CITRATE (PF) 100 MCG/2ML IJ SOLN
INTRAMUSCULAR | Status: AC
  Filled 2015-11-24: qty 2

## 2015-11-24 MED ORDER — MIDAZOLAM HCL 2 MG/2ML IJ SOLN
1.0000 mg | INTRAMUSCULAR | Status: DC | PRN
  Administered 2015-11-24: 2 mg via INTRAVENOUS

## 2015-11-24 MED ORDER — PROPOFOL 10 MG/ML IV BOLUS
INTRAVENOUS | Status: AC
  Filled 2015-11-24: qty 20

## 2015-11-24 MED ORDER — OXYCODONE HCL 5 MG PO TABS
ORAL_TABLET | ORAL | Status: AC
  Filled 2015-11-24: qty 1

## 2015-11-24 MED ORDER — BUPIVACAINE HCL (PF) 0.25 % IJ SOLN
INTRAMUSCULAR | Status: DC | PRN
  Administered 2015-11-24: 7.5 mL
  Administered 2015-11-24: 10 mL

## 2015-11-24 MED ORDER — ONDANSETRON HCL 4 MG/2ML IJ SOLN
INTRAMUSCULAR | Status: DC | PRN
  Administered 2015-11-24: 4 mg via INTRAVENOUS

## 2015-11-24 MED ORDER — ONDANSETRON HCL 4 MG/2ML IJ SOLN: 4.0000 mg | Freq: Once | INTRAMUSCULAR | Status: DC | PRN

## 2015-11-24 MED ORDER — CEPHALEXIN 500 MG PO CAPS: 500.0000 mg | ORAL_CAPSULE | Freq: Four times a day (QID) | ORAL | Status: DC

## 2015-11-24 MED ORDER — DEXAMETHASONE SODIUM PHOSPHATE 10 MG/ML IJ SOLN
INTRAMUSCULAR | Status: DC | PRN
  Administered 2015-11-24: 10 mg via INTRAVENOUS

## 2015-11-24 MED ORDER — SCOPOLAMINE 1 MG/3DAYS TD PT72: 1.0000 | MEDICATED_PATCH | Freq: Once | TRANSDERMAL | Status: DC

## 2015-11-24 MED ORDER — CEFAZOLIN SODIUM-DEXTROSE 2-3 GM-% IV SOLR
INTRAVENOUS | Status: AC
  Filled 2015-11-24: qty 50

## 2015-11-24 MED ORDER — ONDANSETRON HCL 4 MG/2ML IJ SOLN
INTRAMUSCULAR | Status: AC
  Filled 2015-11-24: qty 2

## 2015-11-24 MED ORDER — OXYCODONE HCL 5 MG PO TABS: 5.0000 mg | ORAL_TABLET | Freq: Once | ORAL | Status: DC | PRN

## 2015-11-24 MED ORDER — OXYCODONE HCL 5 MG PO TABS: 10.0000 mg | ORAL_TABLET | ORAL | Status: DC | PRN

## 2015-11-24 MED ORDER — DEXAMETHASONE SODIUM PHOSPHATE 10 MG/ML IJ SOLN
INTRAMUSCULAR | Status: AC
  Filled 2015-11-24: qty 1

## 2015-11-24 MED ORDER — LIDOCAINE HCL (CARDIAC) 20 MG/ML IV SOLN
INTRAVENOUS | Status: DC | PRN
  Administered 2015-11-24: 50 mg via INTRAVENOUS

## 2015-11-24 MED ORDER — OXYCODONE HCL 5 MG/5ML PO SOLN: 5.0000 mg | Freq: Once | ORAL | Status: DC | PRN

## 2015-11-24 MED ORDER — GLYCOPYRROLATE 0.2 MG/ML IJ SOLN: 0.2000 mg | Freq: Once | INTRAMUSCULAR | Status: DC | PRN

## 2015-11-24 MED ORDER — CEFAZOLIN SODIUM-DEXTROSE 2-3 GM-% IV SOLR
2.0000 g | INTRAVENOUS | Status: AC
  Administered 2015-11-24: 2 g via INTRAVENOUS

## 2015-11-24 MED ORDER — LACTATED RINGERS IV SOLN
INTRAVENOUS | Status: DC
  Administered 2015-11-24 (×2): via INTRAVENOUS

## 2015-11-24 MED ORDER — PROPOFOL 10 MG/ML IV BOLUS
INTRAVENOUS | Status: DC | PRN
  Administered 2015-11-24: 200 mg via INTRAVENOUS

## 2015-11-24 MED ORDER — FENTANYL CITRATE (PF) 100 MCG/2ML IJ SOLN: 25.0000 ug | INTRAMUSCULAR | Status: DC | PRN

## 2015-11-24 SURGICAL SUPPLY — 53 items
BANDAGE COBAN STERILE 2 (GAUZE/BANDAGES/DRESSINGS) ×4 IMPLANT
BANDAGE ELASTIC 3 VELCRO ST LF (GAUZE/BANDAGES/DRESSINGS) IMPLANT
BLADE SURG 15 STRL LF DISP TIS (BLADE) ×2 IMPLANT
BLADE SURG 15 STRL SS (BLADE) ×2
BNDG COHESIVE 1X5 TAN STRL LF (GAUZE/BANDAGES/DRESSINGS) IMPLANT
BNDG COHESIVE 3X5 TAN STRL LF (GAUZE/BANDAGES/DRESSINGS) IMPLANT
BNDG CONFORM 3 STRL LF (GAUZE/BANDAGES/DRESSINGS) ×2 IMPLANT
BNDG GAUZE ELAST 4 BULKY (GAUZE/BANDAGES/DRESSINGS) IMPLANT
BRUSH SCRUB EZ PLAIN DRY (MISCELLANEOUS) ×2 IMPLANT
CORDS BIPOLAR (ELECTRODE) ×2 IMPLANT
COVER BACK TABLE 60X90IN (DRAPES) ×2 IMPLANT
CUFF TOURNIQUET SINGLE 18IN (TOURNIQUET CUFF) IMPLANT
DECANTER SPIKE VIAL GLASS SM (MISCELLANEOUS) IMPLANT
DRAPE EXTREMITY T 121X128X90 (DRAPE) ×2 IMPLANT
DRAPE SURG 17X23 STRL (DRAPES) ×2 IMPLANT
DRSG EMULSION OIL 3X3 NADH (GAUZE/BANDAGES/DRESSINGS) ×2 IMPLANT
GAUZE SPONGE 4X4 12PLY STRL (GAUZE/BANDAGES/DRESSINGS) IMPLANT
GLOVE BIOGEL M STRL SZ7.5 (GLOVE) IMPLANT
GLOVE SS BIOGEL STRL SZ 8 (GLOVE) ×1 IMPLANT
GLOVE SUPERSENSE BIOGEL SZ 8 (GLOVE) ×1
GOWN STRL REUS W/ TWL LRG LVL3 (GOWN DISPOSABLE) ×1 IMPLANT
GOWN STRL REUS W/ TWL XL LVL3 (GOWN DISPOSABLE) ×1 IMPLANT
GOWN STRL REUS W/TWL LRG LVL3 (GOWN DISPOSABLE) ×1
GOWN STRL REUS W/TWL XL LVL3 (GOWN DISPOSABLE) ×1
LOOP VESSEL MAXI BLUE (MISCELLANEOUS) IMPLANT
NEEDLE HYPO 22GX1.5 SAFETY (NEEDLE) IMPLANT
NEEDLE HYPO 25X1 1.5 SAFETY (NEEDLE) ×2 IMPLANT
NS IRRIG 1000ML POUR BTL (IV SOLUTION) ×2 IMPLANT
PACK BASIN DAY SURGERY FS (CUSTOM PROCEDURE TRAY) ×2 IMPLANT
PAD ALCOHOL SWAB (MISCELLANEOUS) IMPLANT
PAD CAST 3X4 CTTN HI CHSV (CAST SUPPLIES) IMPLANT
PADDING CAST ABS 3INX4YD NS (CAST SUPPLIES)
PADDING CAST ABS 4INX4YD NS (CAST SUPPLIES)
PADDING CAST ABS COTTON 3X4 (CAST SUPPLIES) IMPLANT
PADDING CAST ABS COTTON 4X4 ST (CAST SUPPLIES) IMPLANT
PADDING CAST COTTON 3X4 STRL (CAST SUPPLIES)
SHEET MEDIUM DRAPE 40X70 STRL (DRAPES) ×2 IMPLANT
SPLINT FIBERGLASS 3X35 (CAST SUPPLIES) IMPLANT
SPLINT PLASTER CAST XFAST 3X15 (CAST SUPPLIES) IMPLANT
SPLINT PLASTER CAST XFAST 4X15 (CAST SUPPLIES) IMPLANT
SPLINT PLASTER XTRA FAST SET 4 (CAST SUPPLIES)
SPLINT PLASTER XTRA FASTSET 3X (CAST SUPPLIES)
STOCKINETTE 4X48 STRL (DRAPES) ×2 IMPLANT
STOCKINETTE SYNTHETIC 3 UNSTER (CAST SUPPLIES) IMPLANT
STOCKINETTE SYNTHETIC 4 NONSTR (MISCELLANEOUS) IMPLANT
STRIP CLOSURE SKIN 1/2X4 (GAUZE/BANDAGES/DRESSINGS) IMPLANT
SUT PROLENE 4 0 PS 2 18 (SUTURE) ×4 IMPLANT
SUT PROLENE 5 0 P 3 (SUTURE) IMPLANT
SYR BULB 3OZ (MISCELLANEOUS) ×2 IMPLANT
SYR CONTROL 10ML LL (SYRINGE) ×2 IMPLANT
TOWEL OR 17X24 6PK STRL BLUE (TOWEL DISPOSABLE) ×2 IMPLANT
TOWEL OR NON WOVEN STRL DISP B (DISPOSABLE) ×2 IMPLANT
UNDERPAD 30X30 (UNDERPADS AND DIAPERS) ×2 IMPLANT

## 2015-11-24 NOTE — Discharge Instructions (Signed)
We recommend that you to take vitamin C 1000 mg a day to promote healing. °We also recommend that if you require  pain medicine that you take a stool softener to prevent constipation as most pain medicines will have constipation side effects. We recommend either Peri-Colace or Senokot and recommend that you also consider adding MiraLAX to prevent the constipation affects from pain medicine if you are required to use them. These medicines are over the counter and maybe purchased at a local pharmacy. A cup of yogurt and a probiotic can also be helpful during the recovery process as the medicines can disrupt your intestinal environment. °Keep bandage clean and dry.  Call for any problems.  No smoking.  Criteria for driving a car: you should be off your pain medicine for 7-8 hours, able to drive one handed(confident), thinking clearly and feeling able in your judgement to drive. °Continue elevation as it will decrease swelling.  If instructed by MD move your fingers within the confines of the bandage/splint.  Use ice if instructed by your MD. Call immediately for any sudden loss of feeling in your hand/arm or change in functional abilities of the extremity. ° ° °Post Anesthesia Home Care Instructions ° °Activity: °Get plenty of rest for the remainder of the day. A responsible adult should stay with you for 24 hours following the procedure.  °For the next 24 hours, DO NOT: °-Drive a car °-Operate machinery °-Drink alcoholic beverages °-Take any medication unless instructed by your physician °-Make any legal decisions or sign important papers. ° °Meals: °Start with liquid foods such as gelatin or soup. Progress to regular foods as tolerated. Avoid greasy, spicy, heavy foods. If nausea and/or vomiting occur, drink only clear liquids until the nausea and/or vomiting subsides. Call your physician if vomiting continues. ° °Special Instructions/Symptoms: °Your throat may feel dry or sore from the anesthesia or the breathing  tube placed in your throat during surgery. If this causes discomfort, gargle with warm salt water. The discomfort should disappear within 24 hours. ° °If you had a scopolamine patch placed behind your ear for the management of post- operative nausea and/or vomiting: ° °1. The medication in the patch is effective for 72 hours, after which it should be removed.  Wrap patch in a tissue and discard in the trash. Wash hands thoroughly with soap and water. °2. You may remove the patch earlier than 72 hours if you experience unpleasant side effects which may include dry mouth, dizziness or visual disturbances. °3. Avoid touching the patch. Wash your hands with soap and water after contact with the patch. °  ° °

## 2015-11-24 NOTE — Anesthesia Postprocedure Evaluation (Signed)
Anesthesia Post Note  Patient: Kathy Mcintosh  Procedure(s) Performed: Procedure(s) (LRB): LEFT RING FINGER AND RIGHT THUMB MASS EXCISION  (Bilateral)  Patient location during evaluation: PACU Anesthesia Type: General Level of consciousness: awake and awake and alert Pain management: pain level controlled Vital Signs Assessment: post-procedure vital signs reviewed and stable Respiratory status: spontaneous breathing Postop Assessment: no signs of nausea or vomiting Anesthetic complications: no    Last Vitals:  Filed Vitals:   11/24/15 1245 11/24/15 1250  BP: 146/81   Pulse: 74 74  Temp:    Resp: 17 13    Last Pain:  Filed Vitals:   11/24/15 1253  PainSc: 0-No pain                 Thamas Appleyard COKER

## 2015-11-24 NOTE — Anesthesia Procedure Notes (Signed)
Procedure Name: LMA Insertion Date/Time: 11/24/2015 11:36 AM Performed by: Maryella Shivers Pre-anesthesia Checklist: Patient identified, Emergency Drugs available, Suction available and Patient being monitored Patient Re-evaluated:Patient Re-evaluated prior to inductionOxygen Delivery Method: Circle System Utilized Preoxygenation: Pre-oxygenation with 100% oxygen Intubation Type: IV induction Ventilation: Mask ventilation without difficulty LMA: LMA inserted LMA Size: 4.0 Number of attempts: 1 Airway Equipment and Method: Bite block Placement Confirmation: positive ETCO2 Tube secured with: Tape Dental Injury: Teeth and Oropharynx as per pre-operative assessment

## 2015-11-24 NOTE — Transfer of Care (Signed)
Immediate Anesthesia Transfer of Care Note  Patient: Kathy Mcintosh  Procedure(s) Performed: Procedure(s): LEFT RING FINGER AND RIGHT THUMB MASS EXCISION  (Bilateral)  Patient Location: PACU  Anesthesia Type:General  Level of Consciousness: awake, alert  and oriented  Airway & Oxygen Therapy: Patient Spontanous Breathing and Patient connected to face mask oxygen  Post-op Assessment: Report given to RN and Post -op Vital signs reviewed and stable  Post vital signs: Reviewed and stable  Last Vitals:  Filed Vitals:   11/24/15 0926  BP: 145/74  Pulse: 72  Temp: 36.6 C  Resp: 18    Complications: No apparent anesthesia complications

## 2015-11-24 NOTE — Op Note (Signed)
[place]

## 2015-11-24 NOTE — Anesthesia Preprocedure Evaluation (Addendum)
Anesthesia Evaluation  Patient identified by MRN, date of birth, ID band Patient awake    Reviewed: Allergy & Precautions, NPO status , Patient's Chart, lab work & pertinent test results  Airway Mallampati: II  TM Distance: >3 FB Neck ROM: Full    Dental  (+) Teeth Intact, Dental Advisory Given   Pulmonary    breath sounds clear to auscultation       Cardiovascular  Rhythm:Regular Rate:Normal     Neuro/Psych    GI/Hepatic   Endo/Other    Renal/GU      Musculoskeletal   Abdominal   Peds  Hematology   Anesthesia Other Findings   Reproductive/Obstetrics                            Anesthesia Physical Anesthesia Plan  ASA: II  Anesthesia Plan: General   Post-op Pain Management:    Induction: Intravenous  Airway Management Planned: LMA  Additional Equipment:   Intra-op Plan:   Post-operative Plan:   Informed Consent: I have reviewed the patients History and Physical, chart, labs and discussed the procedure including the risks, benefits and alternatives for the proposed anesthesia with the patient or authorized representative who has indicated his/her understanding and acceptance.   Dental advisory given  Plan Discussed with: CRNA and Anesthesiologist  Anesthesia Plan Comments:         Anesthesia Quick Evaluation  

## 2015-11-24 NOTE — H&P (Signed)
Kathy Mcintosh is an 59 y.o. female.   Chief Complaint: Patient presents for left ring and right thumb mass excisions HPI: Patient presents for evaluation and treatment of the of their upper extremity predicament. The patient denies neck, back, chest or  abdominal pain. The patient notes that they have no lower extremity problems. The patients primary complaint is noted. We are planning surgical care pathway for the upper extremity.  Past Medical History  Diagnosis Date  . Hypothyroidism   . Esophageal spasm     Past Surgical History  Procedure Laterality Date  . Cholecystectomy  1984  . Knee arthroscopy  Right 2011, Left 2012  . Gastric bypass  2004  . Mm breast stereo bx*l*r/s  Dec 2013  . Breast mass excision Right Jan 2014  . Abdominal hysterectomy  10/14  . 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 Negative margins.l    Family History  Problem Relation Age of Onset  . Lung cancer Mother   . Cervical cancer Mother   . Hypertension Mother   . Asthma Mother   . COPD Mother   . Dementia Mother   . Cervical cancer Sister   . Breast cancer Paternal Aunt 21  . Breast cancer Sister   . Heart failure Father   . Heart disease Father   . COPD Father   . Asthma Daughter    Social History:  reports that she has never smoked. She has never used smokeless tobacco. She reports that she does not drink alcohol or use illicit drugs.  Allergies:  Allergies  Allergen Reactions  . Sulfa Antibiotics Nausea And Vomiting    Medications Prior to Admission  Medication Sig Dispense Refill  . aspirin 81 MG tablet Take by mouth.    . B COMPLEX VITAMINS PO Take by mouth.    . cholecalciferol (VITAMIN D) 1000 UNITS tablet Take 1,000 Units by mouth daily.    . cyanocobalamin (,VITAMIN B-12,) 1000 MCG/ML injection CYANOCOBALAMIN, 1000MCG/ML (Injection Solution) - Historical Medication  1 ml SQ once a month (1000 MCG/ML) Active    .  levothyroxine (SYNTHROID, LEVOTHROID) 100 MCG tablet Take 100 mcg by mouth daily.    . Multiple Vitamin (MULTIVITAMIN) LIQD Take 5 mLs by mouth daily.    . naproxen (NAPROSYN) 500 MG tablet Take by mouth.    . nitrofurantoin (MACRODANTIN) 50 MG capsule TAKE ONE CAPSULE BY MOUTH EVERY DAY 30 capsule 5  . omeprazole (PRILOSEC) 20 MG capsule Take 20 mg by mouth daily.    Marland Kitchen oxybutynin (DITROPAN) 5 MG tablet Take 5 mg by mouth daily.    [name] (STOOL SOFTENER & LAXATIVE PO) Take 1 capsule by mouth daily.    . solifenacin (VESICARE) 5 MG tablet Take by mouth.    . tamoxifen (NOLVADEX) 20 MG tablet TAKE 1 TABLET BY MOUTH EVERY DAY 30 tablet 8    No results found for this or any previous visit (from the past 48 hour(s)). No results found.  Review of Systems  Constitutional: Negative.   Respiratory: Negative.   Genitourinary: Negative.   Psychiatric/Behavioral: Negative.     Blood pressure 145/74, pulse 72, temperature 97.8 F (36.6 C), temperature source Oral, resp. rate 18, height 5\' 4"  (1.626 m), weight 112.719 kg (248 lb 8 oz), last menstrual period 11/28/1990, SpO2 99 %. Physical Exam The patient is alert and oriented in no acute distress. The patient complains of pain in the affected  upper extremity.  The patient is noted to have a normal HEENT exam. Lung fields show equal chest expansion and no shortness of breath. Abdomen exam is nontender without distention. Lower extremity examination does not show any fracture dislocation or blood clot symptoms. Pelvis is stable and the neck and back are stable and nontender.  Mass ring finger left hand and right thumb dorsal region  Assessment/Plan We will plan for left ring finger mass excision and right thumb mass excision.  All questions have been encouraged and answered Kathy Mcintosh III,Kathy Mcintosh M 11/24/2015, 11:21 AM

## 2015-11-27 ENCOUNTER — Encounter (HOSPITAL_BASED_OUTPATIENT_CLINIC_OR_DEPARTMENT_OTHER): Payer: Self-pay | Admitting: Orthopedic Surgery

## 2015-11-27 NOTE — Op Note (Signed)
[name], Mcintosh NO.:  [id number]  MEDICAL RECORD NO.:  [id number]  LOCATION:                               FACILITY:  [facility]  PHYSICIAN:  Satira Anis. Vallery Mcdade, M.D.DATE OF BIRTH:  December 13, 1956  DATE OF PROCEDURE:  11/24/2015 DATE OF DISCHARGE:  11/24/2015                              OPERATIVE REPORT   POSTOPERATIVE DIAGNOSES: 1. Left ring finger deep mass. 2. Right thumb deep mass.  POSTOPERATIVE DIAGNOSES: 1. Left ring finger deep mass. 2. Right thumb deep mass.  PROCEDURES: 1. Left ring finger deep mass excision greater than 1.5 cm deep in     location. 2. Right thumb mass removal, arthrotomy, synovectomy (this was a mass     removal less than 1.5 cm with associated arthrotomy, synovectomy of     the IP joint).  SURGEON:  Satira Anis. Amedeo Plenty, M.D.  ASSISTANT:  None.  COMPLICATIONS:  None.  ANESTHESIA:  General.  TOURNIQUET TIME:  Less than 15 minutes per side.  INDICATIONS:  Pleasant female, presents with above-mentioned diagnoses. I have counseled him in regard to risks and benefits of surgery.  She understands do's and don'ts, risks, benefits and desires to proceed.  OPERATIVE PROCEDURE:  Taken to procedure suite, underwent smooth induction of general anesthesia, prepped and draped in usual sterile fashion, Betadine scrub and paint after anesthesia was secured.  Time- out was observed.  Pre and postop check was complete.  Once this was performed, incision was made about the left ring finger volar midline. Dissection was carried out with facial nerve dissector and a large greater than 1.5-cm tumor likely neural in origin was removed.  This was removed in its entirety.  I very carefully protected the neurovascular bundles and following this irrigated and closed the wound with Prolene. This was sent for specimen as it was a solid greater than 1.5-cm tumor. She had a large amount of loss of her adiposity in this region due to the mass effect of  the tumor.  Nevertheless, closure went without difficulty and I feel that she will have some repopulation of her pulp.  This was dressed after block was placed and there were no complicating features.  Following this, we turned attention towards the right thumb.  Incision was made in dorsal midline.  Dissection was carried down.  Extensor apparatus split.  Following this, mass was removed.  This was a bony mass off the IP joint.  It was benign.  This was a less than 1.5-cm mass removed off the bony IP joint origin.  The patient had associated ganglion cysts here and this was removed in its entirety.  I performed arthrotomy and synovectomy, cleaning out the joint and there were no complicating features.  Following this, copious irrigation was applied followed by closure of the wound with FiberWire about the extensor apparatus of the 4-0 variety and a 4-0 Prolene skin closure. Sterile dressing was placed and 10 mL of Sensorcaine without epinephrine was placed in the thumb in the form of a block for postop analgesia.  She tolerated the procedure well.  We will see her back in a week for dressing change, 2 weeks for sutures out and range  of motion.  Do's and don'ts are discussed and all questions have been encouraged and answered.     Satira Anis. Amedeo Plenty, M.D.     [facility]  D:  11/24/2015  T:  11/25/2015  Job:  [id number]

## 2015-11-30 ENCOUNTER — Encounter (HOSPITAL_BASED_OUTPATIENT_CLINIC_OR_DEPARTMENT_OTHER): Payer: Self-pay | Admitting: Orthopedic Surgery

## 2015-12-20 ENCOUNTER — Telehealth: Payer: Self-pay | Admitting: Family Medicine

## 2016-01-13 ENCOUNTER — Other Ambulatory Visit: Payer: Self-pay | Admitting: Family Medicine

## 2016-01-15 ENCOUNTER — Telehealth: Payer: Self-pay | Admitting: Emergency Medicine

## 2016-01-15 NOTE — Telephone Encounter (Signed)
has been refilled

## 2016-01-15 NOTE — Telephone Encounter (Signed)
Pt called and wants to know if she can get a refill on her Cipro. She has recurrent UTIs (she take Macrobid daily) and is having her typical UTI symptoms. She reports that we were going to call her in a refill on the Cipro last time she was here but forgot. She wants to know if you can just call her in a refill or you want to see her. Please advise.

## 2016-01-15 NOTE — Telephone Encounter (Signed)
Pt informed

## 2016-01-18 ENCOUNTER — Encounter: Payer: Self-pay | Admitting: *Deleted

## 2016-02-02 ENCOUNTER — Encounter: Payer: Self-pay | Admitting: Family Medicine

## 2016-02-02 ENCOUNTER — Ambulatory Visit (INDEPENDENT_AMBULATORY_CARE_PROVIDER_SITE_OTHER): Payer: 59 | Admitting: Family Medicine

## 2016-02-02 VITALS — BP 118/80 | HR 74 | Temp 98.5°F | Resp 16 | Wt 261.8 lb

## 2016-02-02 DIAGNOSIS — S41112A Laceration without foreign body of left upper arm, initial encounter: Secondary | ICD-10-CM

## 2016-02-02 DIAGNOSIS — S41111A Laceration without foreign body of right upper arm, initial encounter: Secondary | ICD-10-CM

## 2016-02-02 MED ORDER — CEPHALEXIN 500 MG PO CAPS: 500.0000 mg | ORAL_CAPSULE | Freq: Two times a day (BID) | ORAL | Status: DC

## 2016-02-02 NOTE — Progress Notes (Signed)
Subjective:     Patient ID: Kathy Mcintosh, female   DOB: 29-Aug-1956, 60 y.o.   MRN: [id number]  HPI  Chief Complaint  Patient presents with  . Animal Bite    Patient comes in office today to have skin looked at, on Monday 01/29/16 patient was playing with her puppy like she normally does and was scratched on her arm. Patient reports that scratch had scabbed over but when she pulled it off today skin seemed to be raised and more irritated. Last recorded TD was 09/25/14.   States she has been putting Neosporin on the scratches.   Review of Systems     Objective:   Physical Exam  Constitutional: She appears well-developed and well-nourished. No distress.  Skin:  Multiple superficial lacerations on her bilateral forearms. No drainage noted.       Assessment:    1. Lacerations of multiple sites of left arm, initial encounter: superficial - cephALEXin (KEFLEX) 500 MG capsule; Take 1 capsule (500 mg total) by mouth 2 (two) times daily.  Dispense: 14 capsule; Refill: 0  2. Lacerations of multiple sites of right arm, initial encounter: superficial - cephALEXin (KEFLEX) 500 MG capsule; Take 1 capsule (500 mg total) by mouth 2 (two) times daily.  Dispense: 14 capsule; Refill: 0    Plan:    Continue soap and water cleansings; abx ointment.

## 2016-02-02 NOTE — Patient Instructions (Signed)
Discussed cleansing with soap and water daily; apply antibiotic ointment.

## 2016-02-03 ENCOUNTER — Other Ambulatory Visit: Payer: Self-pay | Admitting: Family Medicine

## 2016-02-14 ENCOUNTER — Ambulatory Visit (INDEPENDENT_AMBULATORY_CARE_PROVIDER_SITE_OTHER): Payer: 59 | Admitting: Family Medicine

## 2016-02-14 VITALS — BP 134/92 | HR 84 | Temp 99.0°F | Resp 18 | Wt 258.0 lb

## 2016-02-14 DIAGNOSIS — R05 Cough: Secondary | ICD-10-CM

## 2016-02-14 DIAGNOSIS — J101 Influenza due to other identified influenza virus with other respiratory manifestations: Secondary | ICD-10-CM

## 2016-02-14 DIAGNOSIS — R059 Cough, unspecified: Secondary | ICD-10-CM

## 2016-02-14 LAB — POCT INFLUENZA A/B
INFLUENZA A, POC: NEGATIVE
INFLUENZA B, POC: POSITIVE — AB

## 2016-02-14 MED ORDER — HYDROCODONE-HOMATROPINE 5-1.5 MG/5ML PO SYRP: 5.0000 mL | ORAL_SOLUTION | Freq: Three times a day (TID) | ORAL | Status: DC | PRN

## 2016-02-14 MED ORDER — OSELTAMIVIR PHOSPHATE 75 MG PO CAPS: 75.0000 mg | ORAL_CAPSULE | Freq: Two times a day (BID) | ORAL | Status: DC

## 2016-02-14 NOTE — Progress Notes (Signed)
Patient ID: Kathy Mcintosh, female   DOB: March 01, 1956, 60 y.o.   MRN: [id number]    Subjective:  HPI  Patient states she started to feel bad on Sunday 26th with fever of 101, chills and body aches. She states her grandbaby had the flu last week. Then on following Monday she felt better and took Tylenol and Advil all day long and the same thing Tuesday. She then started to develop sore throat, then dry cough, she has some chest tightness and can hear some rattling, has some runny nose now and her throat hurts sever now. She had some chills earlier and has lower back pain now. She has been taking Delsym, Robitussin. On Saturday the day before she started to feel bad she finished keflex that was given to her for laceration on her arm after a dog scratched her.  Prior to Admission medications   Medication Sig Start Date End Date Taking? Authorizing Provider  aspirin 81 MG tablet Take by mouth.   Yes Historical Provider, MD  B COMPLEX VITAMINS PO Take by mouth. 10/23/10  Yes Historical Provider, MD  cholecalciferol (VITAMIN D) 1000 UNITS tablet Take 1,000 Units by mouth daily.   Yes Historical Provider, MD  cyanocobalamin (,VITAMIN B-12,) 1000 MCG/ML injection Reported on 02/02/2016   Yes Historical Provider, MD  levothyroxine (SYNTHROID, LEVOTHROID) 100 MCG tablet Take 100 mcg by mouth daily.   Yes Historical Provider, MD  Multiple Vitamin (MULTIVITAMIN) LIQD Take 5 mLs by mouth daily.   Yes Historical Provider, MD  naproxen (NAPROSYN) 500 MG tablet TAKE 1 TABLET BY MOUTH TWICE A DAY 02/06/16  Yes Darrien Belter Maceo Pro., MD  nitrofurantoin (MACRODANTIN) 50 MG capsule TAKE ONE CAPSULE BY MOUTH EVERY DAY 08/22/15  Yes Jerrol Banana., MD  omeprazole (PRILOSEC) 20 MG capsule Take 20 mg by mouth daily.   Yes Historical Provider, MD  oxyCODONE (OXY IR/ROXICODONE) 5 MG immediate release tablet Take 2 tablets (10 mg total) by mouth every 4 (four) hours as needed for severe pain. 11/24/15  Yes Roseanne Kaufman, MD   Sennosides-Docusate Sodium (STOOL SOFTENER & LAXATIVE PO) Take 1 capsule by mouth daily.   Yes Historical Provider, MD  tamoxifen (NOLVADEX) 20 MG tablet TAKE 1 TABLET BY MOUTH EVERY DAY 10/18/15  Yes Robert Bellow, MD    Patient Active Problem List   Diagnosis Date Noted  . Arthritis 10/25/2015  . Age-related memory disorder 10/25/2015  . Bladder cystocele 10/25/2015  . Acid reflux 10/25/2015  . Hypercholesteremia 10/25/2015  . BP (high blood pressure) 10/25/2015  . Cannot sleep 10/25/2015  . Headache, migraine 10/25/2015  . 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 B12 deficiency 10/25/2015  . Avitaminosis D 10/25/2015  . Breast mass, right 04/04/2015  . Breast cyst 04/04/2015  . Adiposity 09/21/2013  . Adult hypothyroidism 09/21/2013  . Atypical ductal hyperplasia, breast 06/22/2013  . Hypothyroidism 11/28/2012    Class: Chronic  . Vitamin D deficiency disease 11/28/2012  . Abnormal mammogram with microcalcification 10/30/2012    Past Medical History  Diagnosis Date  . Hypothyroidism   . Esophageal spasm     Social History   Social History  . Marital Status: Married    Spouse Name: N/A  . Number of Children: N/A  . Years of Education: N/A   Occupational History  . Not on file.   Social History Main Topics  . Smoking status: Never Smoker   . Smokeless tobacco: Never Used  . Alcohol Use: No  . Drug  Use: No  . Sexual Activity: Not on file   Other Topics Concern  . Not on file   Social History Narrative    Allergies  Allergen Reactions  . Sulfa Antibiotics Nausea And Vomiting    Review of Systems  Constitutional: Positive for fever, chills and malaise/fatigue.  HENT: Positive for sore throat.   Respiratory: Positive for cough.   Cardiovascular: Negative.   Gastrointestinal: Negative.   Musculoskeletal: Positive for myalgias, back pain and joint pain.    Immunization History  Administered Date(s) Administered  . Td 09/25/2004   Objective:    BP 134/92 mmHg  Pulse 84  Temp(Src) 99 F (37.2 C)  Resp 18  Wt 258 lb (117.028 kg)  SpO2 97%  LMP 11/28/1990  Physical Exam  Constitutional: She is oriented to person, place, and time and well-developed, well-nourished, and in no distress.  HENT:  Head: Normocephalic and atraumatic.  Right Ear: External ear normal.  Left Ear: External ear normal.  Mouth/Throat: Oropharynx is clear and moist.  Eyes: Conjunctivae are normal.  Neck: Normal range of motion. Neck supple.  Cardiovascular: Normal rate, regular rhythm, normal heart sounds and intact distal pulses.   No murmur heard. Pulmonary/Chest: Effort normal and breath sounds normal. No respiratory distress. She has no wheezes.  Abdominal: Soft.  Neurological: She is alert and oriented to person, place, and time.  Skin: Skin is warm and dry.  Psychiatric: Mood, memory, affect and judgment normal.    Lab Results  Component Value Date   WBC 8.3 11/08/2015   HGB 12.5 08/29/2014   HCT 38.4 11/08/2015   PLT 247 11/08/2015   GLUCOSE 94 11/08/2015   CHOL 141 11/08/2015   TRIG 124 11/08/2015   HDL 52 11/08/2015   LDLCALC 64 11/08/2015   TSH 4.020 11/08/2015    CMP     Component Value Date/Time   NA 147* 11/08/2015 0814   K 4.9 11/08/2015 0814   CL 107* 11/08/2015 0814   CO2 24 11/08/2015 0814   GLUCOSE 94 11/08/2015 0814   BUN 10 11/08/2015 0814   CREATININE 0.65 11/08/2015 0814   CREATININE 0.6 08/29/2014   CALCIUM 9.3 11/08/2015 0814   PROT 6.4 11/08/2015 0814   ALBUMIN 3.8 11/08/2015 0814   AST 16 11/08/2015 0814   ALT 10 11/08/2015 0814   ALKPHOS 92 11/08/2015 0814   BILITOT 0.3 11/08/2015 0814   GFRNONAA 97 11/08/2015 0814   GFRAA 112 11/08/2015 0814    Assessment and Plan :  1. Influenza B Positive influenza B. Treat with Tamiflu. Follow as needed. - POCT Influenza A/B - oseltamivir (TAMIFLU) 75 MG capsule; Take 1 capsule (75 mg total) by mouth 2 (two) times daily.  Dispense: 10 capsule; Refill:  0  2. Cough Will provide Hycodan syrup. Advised patient this can last for a while. Patient to let us know if symptoms persist or get worse. - HYDROcodone-homatropine (HYCODAN) 5-1.5 MG/5ML syrup; Take 5 mLs by mouth every 8 (eight) hours as needed for cough.  Dispense: 120 mL; Refill: 0  I have done the exam and reviewed the above chart and it is accurate to the best of my knowledge.  Patient was seen and examined by Dr. Eulas Post and note was scribed by Theressa Millard, RMA.   Miguel Aschoff MD [facility] 02/14/2016 10:53 AM

## 2016-03-22 ENCOUNTER — Ambulatory Visit (INDEPENDENT_AMBULATORY_CARE_PROVIDER_SITE_OTHER): Payer: 59 | Admitting: Family Medicine

## 2016-03-22 VITALS — BP 122/80 | HR 78 | Temp 98.0°F | Resp 18 | Wt 248.0 lb

## 2016-03-22 DIAGNOSIS — R1013 Epigastric pain: Secondary | ICD-10-CM | POA: Diagnosis not present

## 2016-03-22 MED ORDER — SUCRALFATE 1 GM/10ML PO SUSP: 1.0000 g | Freq: Three times a day (TID) | ORAL | Status: DC

## 2016-03-22 NOTE — Patient Instructions (Signed)
We will call you with the lab work. 

## 2016-03-22 NOTE — Progress Notes (Signed)
Subjective:     Patient ID: Kathy Mcintosh, female   DOB: [birth year], 60 y.o.   MRN: [id number]  HPI  Chief Complaint  Patient presents with  . Abdominal Pain  States after she recovered from the flu in March she developed stomach pain after eating. Reports she had been taking ibuprofen and Aleve during her period of illness. She subsequently has had a poor appetite with 10# weight loss since her prior visit March 1. No vomiting or change in her bowel pattern. She has never had a screening colonoscopy. Hx of Roux- en- Y gastric bypass. She had been taking omeprazole daily but switched to Zantac 150 mg.twice daily which seem to relieve her pain better.    Review of Systems     Objective:   Physical Exam  Constitutional: She appears well-developed and well-nourished. No distress.  Pulmonary/Chest: Breath sounds normal.  Abdominal: Bowel sounds are normal. There is tenderness (mild epigastric). There is no guarding.       Assessment:    1. Epigastric pain - Lipase - CBC with Differential/Platelet - H. pylori antibody, IgG - HELICOBACTER PYLORI  ANTIBODY, IGM - sucralfate (CARAFATE) 1 GM/10ML suspension; Take 10 mLs (1 g total) by mouth 4 (four) times daily -  with meals and at bedtime.  Dispense: 420 mL; Refill: 0    Plan:    Samples of Dexilant 60 mg. #15. If labs ok and symptoms improved consider 4 week course on PPI. If no change will refer to G.I. She is to stay off nsaid's.

## 2016-03-26 LAB — CBC WITH DIFFERENTIAL/PLATELET
BASOS ABS: 0 10*3/uL (ref 0.0–0.2)
Basos: 0 %
EOS (ABSOLUTE): 0.1 10*3/uL (ref 0.0–0.4)
Eos: 2 %
HEMOGLOBIN: 12.6 g/dL (ref 11.1–15.9)
Hematocrit: 37.2 % (ref 34.0–46.6)
IMMATURE GRANS (ABS): 0 10*3/uL (ref 0.0–0.1)
IMMATURE GRANULOCYTES: 0 %
LYMPHS: 30 %
Lymphocytes Absolute: 2.4 10*3/uL (ref 0.7–3.1)
MCH: 30.4 pg (ref 26.6–33.0)
MCHC: 33.9 g/dL (ref 31.5–35.7)
MCV: 90 fL (ref 79–97)
MONOCYTES: 9 %
Monocytes Absolute: 0.8 10*3/uL (ref 0.1–0.9)
NEUTROS ABS: 4.8 10*3/uL (ref 1.4–7.0)
NEUTROS PCT: 59 %
Platelets: 245 10*3/uL (ref 150–379)
RBC: 4.14 x10E6/uL (ref 3.77–5.28)
RDW: 14.3 % (ref 12.3–15.4)
WBC: 8.1 10*3/uL (ref 3.4–10.8)

## 2016-03-26 LAB — H. PYLORI ANTIBODY, IGG

## 2016-03-26 LAB — LIPASE: LIPASE: 21 U/L (ref 0–59)

## 2016-03-26 LAB — HELICOBACTER PYLORI  ANTIBODY, IGM

## 2016-03-27 ENCOUNTER — Telehealth: Payer: Self-pay

## 2016-03-27 NOTE — Telephone Encounter (Signed)
Attempted to contact patient. No answer and unable to leave a message due to voicemail being full.  

## 2016-03-27 NOTE — Telephone Encounter (Signed)
-----   Message from Carmon Ginsberg, [place] sent at 03/26/2016  [id number] PM EDT ----- Helicobacter antibodies are negative. Are the Dexilant and Carafate helping?

## 2016-03-27 NOTE — Telephone Encounter (Signed)
Patient has been advised she states that the [place] and Carfate are helping. KW

## 2016-04-01 ENCOUNTER — Encounter: Payer: Self-pay | Admitting: General Surgery

## 2016-04-03 ENCOUNTER — Other Ambulatory Visit: Payer: Self-pay | Admitting: Family Medicine

## 2016-04-03 ENCOUNTER — Ambulatory Visit: Payer: Self-pay | Admitting: General Surgery

## 2016-04-18 ENCOUNTER — Ambulatory Visit: Payer: Self-pay | Admitting: General Surgery

## 2016-04-29 ENCOUNTER — Ambulatory Visit: Payer: 59 | Admitting: Family Medicine

## 2016-05-22 ENCOUNTER — Encounter: Payer: Self-pay | Admitting: *Deleted

## 2016-06-15 ENCOUNTER — Other Ambulatory Visit: Payer: Self-pay | Admitting: Family Medicine

## 2016-07-23 NOTE — Telephone Encounter (Signed)
error 

## 2016-07-29 ENCOUNTER — Other Ambulatory Visit: Payer: Self-pay | Admitting: Family Medicine

## 2016-08-31 ENCOUNTER — Ambulatory Visit (INDEPENDENT_AMBULATORY_CARE_PROVIDER_SITE_OTHER): Payer: 59 | Admitting: Family Medicine

## 2016-08-31 ENCOUNTER — Encounter: Payer: Self-pay | Admitting: Family Medicine

## 2016-08-31 VITALS — BP 122/90 | HR 64 | Temp 98.7°F | Resp 16 | Wt 244.6 lb

## 2016-08-31 DIAGNOSIS — N3090 Cystitis, unspecified without hematuria: Secondary | ICD-10-CM | POA: Diagnosis not present

## 2016-08-31 LAB — POCT URINALYSIS DIPSTICK
Bilirubin, UA: NEGATIVE
Glucose, UA: NEGATIVE
KETONES UA: NEGATIVE
NITRITE UA: NEGATIVE
PH UA: 5
PROTEIN UA: NEGATIVE
Spec Grav, UA: <=1.005
UROBILINOGEN UA: 0.2

## 2016-08-31 MED ORDER — NITROFURANTOIN MONOHYD MACRO 100 MG PO CAPS: 100.0000 mg | ORAL_CAPSULE | Freq: Two times a day (BID) | ORAL | 0 refills | Status: DC

## 2016-08-31 MED ORDER — DOXYCYCLINE HYCLATE 100 MG PO TABS: 100.0000 mg | ORAL_TABLET | Freq: Two times a day (BID) | ORAL | 0 refills | Status: DC

## 2016-08-31 NOTE — Patient Instructions (Addendum)
We will call you with the culture results. Resume Carafate.

## 2016-08-31 NOTE — Progress Notes (Signed)
Subjective:     Patient ID: Kathy Mcintosh, female   DOB: 1956/09/10, 60 y.o.   MRN: [id number]  HPI  Chief Complaint  Patient presents with  . Urinary Tract Infection    Patient comes in office today with concerns of urinary tract infection symptoms for the past 6 days. Patient complains of the following symptoms: nausea, decreased appetite, chills, incontinence, frequency, LUQ pain described as a dull ache and headache.   States she was on cephalexin recently for a tooth abscess and on Macrobid prophylaxis. States abdominal pain resolved two days ago after taking a Percocet. Remains on omeprazole.   Review of Systems     Objective:   Physical Exam  Constitutional: She appears well-developed and well-nourished. No distress.  Abdominal: Soft. There is tenderness (mild epigastric). There is no guarding.  Genitourinary:  Genitourinary Comments: No CVA tenderness       Assessment:    1. Cystitis - Urine culture - POCT urinalysis dipstick - doxycycline (VIBRA-TABS) 100 MG tablet; Take 1 tablet (100 mg total) by mouth 2 (two) times daily.  Dispense: 14 tablet; Refill: 0    Plan:    Further f/u pending urine culture. Resume Carafate.

## 2016-09-02 ENCOUNTER — Telehealth: Payer: Self-pay

## 2016-09-02 LAB — URINE CULTURE: Organism ID, Bacteria: NO GROWTH

## 2016-09-02 NOTE — Telephone Encounter (Signed)
Advised pt of lab results. Pt verbally acknowledges understanding. Emily Drozdowski, CMA   

## 2016-09-02 NOTE — Telephone Encounter (Signed)
-----   Message from Carmon Ginsberg, [place] sent at 09/02/2016  7:32 AM EDT ----- No growth on culture. Are symptoms improving?

## 2016-10-20 ENCOUNTER — Other Ambulatory Visit: Payer: Self-pay | Admitting: General Surgery

## 2016-12-02 ENCOUNTER — Other Ambulatory Visit: Payer: Self-pay | Admitting: Family Medicine

## 2016-12-19 ENCOUNTER — Ambulatory Visit: Payer: Self-pay | Admitting: General Surgery

## 2017-01-06 ENCOUNTER — Ambulatory Visit (INDEPENDENT_AMBULATORY_CARE_PROVIDER_SITE_OTHER): Payer: 59 | Admitting: General Surgery

## 2017-01-06 ENCOUNTER — Encounter: Payer: Self-pay | Admitting: General Surgery

## 2017-01-06 VITALS — BP 130/80 | HR 78 | Resp 12 | Ht 66.5 in | Wt 233.0 lb

## 2017-01-06 DIAGNOSIS — Z1211 Encounter for screening for malignant neoplasm of colon: Secondary | ICD-10-CM | POA: Diagnosis not present

## 2017-01-06 DIAGNOSIS — N6091 Unspecified benign mammary dysplasia of right breast: Secondary | ICD-10-CM

## 2017-01-06 DIAGNOSIS — R1013 Epigastric pain: Secondary | ICD-10-CM

## 2017-01-06 MED ORDER — POLYETHYLENE GLYCOL 3350 17 GM/SCOOP PO POWD: ORAL | 0 refills | Status: DC

## 2017-01-06 NOTE — Patient Instructions (Signed)
Esophagogastroduodenoscopy Introduction Esophagogastroduodenoscopy (EGD) is a procedure to examine the lining of the esophagus, stomach, and first part of the small intestine (duodenum). This procedure is done to check for problems such as inflammation, bleeding, ulcers, or growths. During this procedure, a long, flexible, lighted tube with a camera attached (endoscope) is inserted down the throat. Tell a health care provider about:  Any allergies you have.  All medicines you are taking, including vitamins, herbs, eye drops, creams, and over-the-counter medicines.  Any problems you or family members have had with anesthetic medicines.  Any blood disorders you have.  Any surgeries you have had.  Any medical conditions you have.  Whether you are pregnant or may be pregnant. What are the risks? Generally, this is a safe procedure. However, problems may occur, including:  Infection.  Bleeding.  A tear (perforation) in the esophagus, stomach, or duodenum.  Trouble breathing.  Excessive sweating.  Spasms of the larynx.  A slowed heartbeat.  Low blood pressure. What happens before the procedure?  Follow instructions from your health care provider about eating or drinking restrictions.  Ask your health care provider about:  Changing or stopping your regular medicines. This is especially important if you are taking diabetes medicines or blood thinners.  Taking medicines such as aspirin and ibuprofen. These medicines can thin your blood. Do not take these medicines before your procedure if your health care provider instructs you not to.  Plan to have someone take you home after the procedure.  If you wear dentures, be ready to remove them before the procedure. What happens during the procedure?  To reduce your risk of infection, your health care team will wash or sanitize their hands.  An IV tube will be put in a vein in your hand or arm. You will get medicines and fluids  through this tube.  You will be given one or more of the following:  A medicine to help you relax (sedative).  A medicine to numb the area (local anesthetic). This medicine may be sprayed into your throat. It will make you feel more comfortable and keep you from gagging or coughing during the procedure.  A medicine for pain.  A mouth guard may be placed in your mouth to protect your teeth and to keep you from biting on the endoscope.  You will be asked to lie on your left side.  The endoscope will be lowered down your throat into your esophagus, stomach, and duodenum.  Air will be put into the endoscope. This will help your health care provider see better.  The lining of your esophagus, stomach, and duodenum will be examined.  Your health care provider may:  Take a tissue sample so it can be looked at in a lab (biopsy).  Remove growths.  Remove objects (foreign bodies) that are stuck.  Treat any bleeding with medicines or other devices that stop tissue from bleeding.  Widen (dilate) or stretch narrowed areas of your esophagus and stomach.  The endoscope will be taken out. The procedure may vary among health care providers and hospitals. What happens after the procedure?  Your blood pressure, heart rate, breathing rate, and blood oxygen level will be monitored often until the medicines you were given have worn off.  Do not eat or drink anything until the numbing medicine has worn off and your gag reflex has returned. This information is not intended to replace advice given to you by your health care provider. Make sure you discuss any questions you  have with your health care provider. Document Released: 04/04/2005 Document Revised: 05/09/2016 Document Reviewed: 10/26/2015  2017 Elsevier  Colonoscopy, Adult A colonoscopy is an exam to look at the large intestine. It is done to check for problems, such as:  Lumps (tumors).  Growths (polyps).  Swelling  (inflammation).  Bleeding. What happens before the procedure? Eating and drinking Follow instructions from your doctor about eating and drinking. These instructions may include:  A few days before the procedure - follow a low-fiber diet.  Avoid nuts.  Avoid seeds.  Avoid dried fruit.  Avoid raw fruits.  Avoid vegetables.  1-3 days before the procedure - follow a clear liquid diet. Avoid liquids that have red or purple dye. Drink only clear liquids, such as:  Clear broth or bouillon.  Black coffee or tea.  Clear juice.  Clear soft drinks or sports drinks.  [place].  Popsicles.  On the day of the procedure - do not eat or drink anything during the 2 hours before the procedure. Bowel prep If you were prescribed an oral bowel prep:  Take it as told by your doctor. Starting the day before your procedure, you will need to drink a lot of liquid. The liquid will cause you to poop (have bowel movements) until your poop is almost clear or light green.  If your skin or butt gets irritated from diarrhea, you may:  Wipe the area with wipes that have medicine in them, such as adult wet wipes with aloe and vitamin E.  Put something on your skin that soothes the area, such as petroleum jelly.  If you throw up (vomit) while drinking the bowel prep, take a break for up to 60 minutes. Then begin the bowel prep again. If you keep throwing up and you cannot take the bowel prep without throwing up, call your doctor. General instructions  Ask your doctor about changing or stopping your normal medicines. This is important if you take diabetes medicines or blood thinners.  Plan to have someone take you home from the hospital or clinic. What happens during the procedure?  An IV tube may be put into one of your veins.  You will be given medicine to help you relax (sedative).  To reduce your risk of infection:  Your doctors will wash their hands.  Your anal area will be washed  with soap.  You will be asked to lie on your side with your knees bent.  Your doctor will get a long, thin, flexible tube ready. The tube will have a camera and a light on the end.  The tube will be put into your anus.  The tube will be gently put into your large intestine.  Air will be delivered into your large intestine to keep it open. You may feel some pressure or cramping.  The camera will be used to take photos.  A small tissue sample may be removed from your body to be looked at under a microscope (biopsy). If any possible problems are found, the tissue will be sent to a lab for testing.  If small growths are found, your doctor may remove them and have them checked for cancer.  The tube that was put into your anus will be slowly removed. The procedure may vary among doctors and hospitals. What happens after the procedure?  Your doctor will check on you often until the medicines you were given have worn off.  Do not drive for 24 hours after the procedure.  You may  have a small amount of blood in your poop.  You may pass gas.  You may have mild cramps or bloating in your belly (abdomen).  It is up to you to get the results of your procedure. Ask your doctor, or the department performing the procedure, when your results will be ready. This information is not intended to replace advice given to you by your health care provider. Make sure you discuss any questions you have with your health care provider. Document Released: 01/04/2011 Document Revised: 08/08/2016 Document Reviewed: 02/13/2016 Elsevier Interactive Patient Education  2017 [employer].

## 2017-01-06 NOTE — Progress Notes (Signed)
Patient ID: Kathy Mcintosh, female   DOB: 1956-05-13, 61 y.o.   MRN: [id number]  Chief Complaint  Patient presents with  . Follow-up    mammogram    HPI Kathy Mcintosh is a 61 y.o. female who presents for a breast evaluation. The most recent mammogram was done on 03/29/16. Patient does perform regular self breast checks and gets regular mammograms done. She reports some mild soreness in the right breast.  She does report problems with nausea and stomach pain.  This states back over year when she had what sounds like the flu and she was treating her fever with alternating Tylenol as well as Advil. She had previously been instructed not to make use of anti-inflammatories because of her gastric bypass. Since that time she's had episodes of early satiety, postprandial discomfort as well as some burning sensation. Minimal reflux symptoms.  Mild weight loss over the past year which she attributes to her postprandial discomfort.The patient reports she can need about a half a bowl of oatmeal for feeling full. 12 years out from a bypass this is probably a reasonable pouch expansion.     HPI  Past Medical History:  Diagnosis Date  . Esophageal spasm   . Hypothyroidism     Past Surgical History:  Procedure Laterality Date  . ABDOMINAL HYSTERECTOMY  10/14  . BREAST MASS EXCISION Right Jan 2014  . 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 Negative margins.l  . CHOLECYSTECTOMY  1984  . GASTRIC BYPASS  2004  . KNEE ARTHROSCOPY  Right 2011, Left 2012  . MASS EXCISION Bilateral 11/24/2015   Procedure: LEFT RING FINGER AND RIGHT THUMB MASS EXCISION ;  Surgeon: Roseanne Kaufman, MD;  Location: [facility];  Service: Orthopedics;  Laterality: Bilateral;  . MM BREAST STEREO BX*L*R/S  Dec 2013    Family History  Problem Relation Age of Onset  . Lung cancer Mother   . Cervical cancer Mother   . Hypertension Mother   . Asthma Mother   . COPD  Mother   . Dementia Mother   . Cervical cancer Sister   . Breast cancer Sister   . Heart failure Father   . Heart disease Father   . COPD Father   . Asthma Daughter   . Breast cancer Paternal Aunt 31    Social History Social History  Substance Use Topics  . Smoking status: Never Smoker  . Smokeless tobacco: Never Used  . Alcohol use No    Allergies  Allergen Reactions  . Sulfa Antibiotics Nausea And Vomiting    Current Outpatient Prescriptions  Medication Sig Dispense Refill  . aspirin 81 MG tablet Take by mouth.    . B COMPLEX VITAMINS PO Take by mouth.    . cholecalciferol (VITAMIN D) 1000 UNITS tablet Take 1,000 Units by mouth daily.    Marland Kitchen levothyroxine (SYNTHROID, LEVOTHROID) 100 MCG tablet TAKE 1 TABLET BY MOUTH DAILY 30 tablet 11  . Multiple Vitamin (MULTIVITAMIN) LIQD Take 5 mLs by mouth daily.    . naproxen (NAPROSYN) 500 MG tablet TAKE 1 TABLET BY MOUTH TWICE A DAY 60 tablet 3  . nitrofurantoin (MACRODANTIN) 50 MG capsule TAKE ONE CAPSULE BY MOUTH EVERY DAY 30 capsule 5  . omeprazole (PRILOSEC) 20 MG capsule Take 20 mg by mouth daily. Reported on 03/22/2016    . ranitidine (ZANTAC) 150 MG tablet Take 150 mg by mouth 2 (two) times daily.    Marland Kitchen  Sennosides-Docusate Sodium (STOOL SOFTENER & LAXATIVE PO) Take 1 capsule by mouth daily.    . sucralfate (CARAFATE) 1 GM/10ML suspension Take 10 mLs (1 g total) by mouth 4 (four) times daily -  with meals and at bedtime. 420 mL 0  . tamoxifen (NOLVADEX) 20 MG tablet TAKE 1 TABLET BY MOUTH EVERY DAY 30 tablet 8  . vitamin B-12 (CYANOCOBALAMIN) 1000 MCG tablet Take 1,000 mcg by mouth daily.    . polyethylene glycol powder (GLYCOLAX/MIRALAX) powder 255 grams one bottle for colonoscopy prep 255 g 0   No current facility-administered medications for this visit.     Review of Systems Review of Systems  Constitutional: Negative.   Respiratory: Negative.   Cardiovascular: Negative.     Blood pressure 130/80, pulse 78, resp. rate  12, height 5' 6.5" (1.689 m), weight 233 lb (105.7 kg), last menstrual period 11/28/1990.  Physical Exam Physical Exam  Constitutional: She is oriented to person, place, and time. She appears well-developed and well-nourished.  Eyes: Conjunctivae are normal. No scleral icterus.  Neck: Neck supple.  Cardiovascular: Normal rate, regular rhythm and normal heart sounds.   Pulmonary/Chest: Effort normal and breath sounds normal. Right breast exhibits no inverted nipple, no mass, no nipple discharge, no skin change and no tenderness. Left breast exhibits no inverted nipple, no mass, no nipple discharge, no skin change and no tenderness.  Abdominal: Soft. Normal appearance and bowel sounds are normal. There is no hepatosplenomegaly. There is no tenderness. No hernia.    Lymphadenopathy:    She has no cervical adenopathy.    She has no axillary adenopathy.  Neurological: She is alert and oriented to person, place, and time.  Skin: Skin is warm and dry.  Psychiatric: She has a normal mood and affect.    Data Reviewed 03/29/2016 bilateral mammograms reviewed.. No interval change.  BI-RADS-2.  H. Pylori testing in April 2017 was negative.Lipase normal.  CBC from April 2017:Hemoglobin 12.6 with an MCV of 90, white blood cell count 8100. Normal differential.  Assessment    Benign breast exam.  Postprandial pain status post nonsteroidal exposure,  Questionable marginal ulcer.  No past history of colonoscopy.    Plan    We'll arrange for screening mammograms in one year regards to the microscopic foci of DCIS identified at the time of her 2013 biopsy    Colonoscopy and upper endoscopy with possible biopsy/polypectomy prn: Information regarding the procedure, including its potential risks and complications (including but not limited to perforation of the bowel, which may require emergency surgery to repair, and bleeding) was verbally given to the patient. Educational information regarding  lower intestinal endoscopy was given to the patient. Written instructions for how to complete the bowel prep using Miralax were provided. The importance of drinking ample fluids to avoid dehydration as a result of the prep emphasized.  Patient has been scheduled for an upper and lower endoscopy on 01-15-17 at [facility]. It is okay for patient to continue 81 mg aspirin.   This has been scribed by Lesly Rubenstein LPN    Robert Bellow 01/06/2017, 8:17 PM

## 2017-01-15 ENCOUNTER — Ambulatory Visit
Admission: RE | Admit: 2017-01-15 | Discharge: 2017-01-15 | Disposition: A | Discharge status: Home / Self Care | Payer: 59 | Source: Ambulatory Visit | Attending: General Surgery | Admitting: General Surgery

## 2017-01-15 ENCOUNTER — Encounter
Admission: RE | Disposition: A | Discharge status: Home / Self Care | Payer: Self-pay | Source: Ambulatory Visit | Attending: General Surgery

## 2017-01-15 ENCOUNTER — Ambulatory Visit: Payer: 59 | Admitting: Anesthesiology

## 2017-01-15 ENCOUNTER — Encounter: Payer: Self-pay | Admitting: *Deleted

## 2017-01-15 DIAGNOSIS — K2950 Unspecified chronic gastritis without bleeding: Secondary | ICD-10-CM | POA: Diagnosis not present

## 2017-01-15 DIAGNOSIS — K257 Chronic gastric ulcer without hemorrhage or perforation: Secondary | ICD-10-CM | POA: Insufficient documentation

## 2017-01-15 DIAGNOSIS — K5730 Diverticulosis of large intestine without perforation or abscess without bleeding: Secondary | ICD-10-CM | POA: Diagnosis not present

## 2017-01-15 DIAGNOSIS — Z6841 Body Mass Index (BMI) 40.0 and over, adult: Secondary | ICD-10-CM | POA: Insufficient documentation

## 2017-01-15 DIAGNOSIS — I10 Essential (primary) hypertension: Secondary | ICD-10-CM | POA: Insufficient documentation

## 2017-01-15 DIAGNOSIS — R1013 Epigastric pain: Secondary | ICD-10-CM | POA: Diagnosis not present

## 2017-01-15 DIAGNOSIS — K317 Polyp of stomach and duodenum: Secondary | ICD-10-CM | POA: Diagnosis not present

## 2017-01-15 DIAGNOSIS — Z791 Long term (current) use of non-steroidal anti-inflammatories (NSAID): Secondary | ICD-10-CM | POA: Diagnosis not present

## 2017-01-15 DIAGNOSIS — Z9884 Bariatric surgery status: Secondary | ICD-10-CM | POA: Insufficient documentation

## 2017-01-15 DIAGNOSIS — K219 Gastro-esophageal reflux disease without esophagitis: Secondary | ICD-10-CM | POA: Diagnosis not present

## 2017-01-15 DIAGNOSIS — Z79899 Other long term (current) drug therapy: Secondary | ICD-10-CM | POA: Insufficient documentation

## 2017-01-15 DIAGNOSIS — K922 Gastrointestinal hemorrhage, unspecified: Secondary | ICD-10-CM | POA: Diagnosis not present

## 2017-01-15 DIAGNOSIS — E039 Hypothyroidism, unspecified: Secondary | ICD-10-CM | POA: Insufficient documentation

## 2017-01-15 DIAGNOSIS — Q439 Congenital malformation of intestine, unspecified: Secondary | ICD-10-CM | POA: Diagnosis not present

## 2017-01-15 DIAGNOSIS — Z7982 Long term (current) use of aspirin: Secondary | ICD-10-CM | POA: Diagnosis not present

## 2017-01-15 DIAGNOSIS — Z1211 Encounter for screening for malignant neoplasm of colon: Secondary | ICD-10-CM | POA: Diagnosis not present

## 2017-01-15 DIAGNOSIS — K259 Gastric ulcer, unspecified as acute or chronic, without hemorrhage or perforation: Secondary | ICD-10-CM | POA: Diagnosis not present

## 2017-01-15 DIAGNOSIS — Z79810 Long term (current) use of selective estrogen receptor modulators (SERMs): Secondary | ICD-10-CM | POA: Diagnosis not present

## 2017-01-15 HISTORY — PX: ESOPHAGOGASTRODUODENOSCOPY (EGD) WITH PROPOFOL: SHX5813

## 2017-01-15 HISTORY — PX: COLONOSCOPY WITH PROPOFOL: SHX5780

## 2017-01-15 SURGERY — COLONOSCOPY WITH PROPOFOL
Anesthesia: General

## 2017-01-15 MED ORDER — FENTANYL CITRATE (PF) 100 MCG/2ML IJ SOLN
INTRAMUSCULAR | Status: DC | PRN
  Administered 2017-01-15: 100 ug via INTRAVENOUS

## 2017-01-15 MED ORDER — LIDOCAINE HCL (PF) 2 % IJ SOLN
INTRAMUSCULAR | Status: AC
  Filled 2017-01-15: qty 2

## 2017-01-15 MED ORDER — SODIUM CHLORIDE 0.9 % IV SOLN
INTRAVENOUS | Status: DC
  Administered 2017-01-15: 1000 mL via INTRAVENOUS

## 2017-01-15 MED ORDER — LIDOCAINE HCL (CARDIAC) 20 MG/ML IV SOLN
INTRAVENOUS | Status: DC | PRN
  Administered 2017-01-15: 2 mL via INTRAVENOUS

## 2017-01-15 MED ORDER — FENTANYL CITRATE (PF) 100 MCG/2ML IJ SOLN
INTRAMUSCULAR | Status: AC
  Filled 2017-01-15: qty 2

## 2017-01-15 MED ORDER — MIDAZOLAM HCL 2 MG/2ML IJ SOLN
INTRAMUSCULAR | Status: DC | PRN
  Administered 2017-01-15: 2 mg via INTRAVENOUS

## 2017-01-15 MED ORDER — MIDAZOLAM HCL 2 MG/2ML IJ SOLN
INTRAMUSCULAR | Status: AC
  Filled 2017-01-15: qty 2

## 2017-01-15 MED ORDER — PROPOFOL 500 MG/50ML IV EMUL
INTRAVENOUS | Status: DC | PRN
  Administered 2017-01-15: 120 ug/kg/min via INTRAVENOUS

## 2017-01-15 MED ORDER — PROPOFOL 10 MG/ML IV BOLUS
INTRAVENOUS | Status: DC | PRN
  Administered 2017-01-15: 30 mg via INTRAVENOUS
  Administered 2017-01-15: 20 mg via INTRAVENOUS

## 2017-01-15 MED ORDER — PROPOFOL 10 MG/ML IV BOLUS
INTRAVENOUS | Status: AC
  Filled 2017-01-15: qty 20

## 2017-01-15 NOTE — Anesthesia Post-op Follow-up Note (Signed)
Anesthesia QCDR form completed.        

## 2017-01-15 NOTE — Anesthesia Preprocedure Evaluation (Addendum)
Anesthesia Evaluation  Patient identified by MRN, date of birth, ID band Patient awake    Reviewed: Allergy & Precautions, NPO status , Patient's Chart, lab work & pertinent test results  Airway Mallampati: II       Dental  (+) Upper Dentures, Lower Dentures   Pulmonary neg pulmonary ROS,     + decreased breath sounds      Cardiovascular Exercise Tolerance: Good hypertension,  Rhythm:Regular     Neuro/Psych  Headaches, Anxiety    GI/Hepatic Neg liver ROS, GERD  Medicated,  Endo/Other  Hypothyroidism Morbid obesity  Renal/GU negative Renal ROS     Musculoskeletal   Abdominal (+) + obese,   Peds negative pediatric ROS (+)  Hematology   Anesthesia Other Findings   Reproductive/Obstetrics                           Anesthesia Physical Anesthesia Plan  ASA: III  Anesthesia Plan: General   Post-op Pain Management:    Induction: Intravenous  Airway Management Planned: Natural Airway and Nasal Cannula  Additional Equipment:   Intra-op Plan:   Post-operative Plan:   Informed Consent: I have reviewed the patients History and Physical, chart, labs and discussed the procedure including the risks, benefits and alternatives for the proposed anesthesia with the patient or authorized representative who has indicated his/her understanding and acceptance.     Plan Discussed with: Surgeon  Anesthesia Plan Comments:        Anesthesia Quick Evaluation

## 2017-01-15 NOTE — Op Note (Signed)
[facility] Gastroenterology Patient Name: Kathy Mcintosh Procedure Date: 01/15/2017 11:05 AM MRN: [id number] Account #: [id number] Date of Birth: Jan 11, 1956 Admit Type: Outpatient Age: 61 Room: [facility] ENDO ROOM 1 Gender: Female Note Status: Finalized Procedure:            Upper GI endoscopy Indications:          Epigastric abdominal pain, Gastrointestinal bleeding of                        unknown origin Providers:            Robert Bellow, MD Referring MD:         Janine Ores. Rosanna Randy, MD (Referring MD) Medicines:            Monitored Anesthesia Care Complications:        No immediate complications. Procedure:            Pre-Anesthesia Assessment:                       - Prior to the procedure, a History and Physical was                        performed, and patient medications, allergies and                        sensitivities were reviewed. The patient's tolerance of                        previous anesthesia was reviewed.                       - The risks and benefits of the procedure and the                        sedation options and risks were discussed with the                        patient. All questions were answered and informed                        consent was obtained.                       After obtaining informed consent, the endoscope was                        passed under direct vision. Throughout the procedure,                        the patient's blood pressure, pulse, and oxygen                        saturations were monitored continuously. The Endoscope                        was introduced through the mouth, and advanced to the                        efferent jejunal loop. The upper GI endoscopy was  accomplished without difficulty. The patient tolerated                        the procedure well. Findings:      The esophagus was normal.      Multiple 5 mm sessile polyps with no bleeding and no stigmata of recent        bleeding were found at the anastomosis. Biopsies were taken with a cold       forceps for histology.      One non-bleeding cratered gastric ulcer with no stigmata of bleeding was       found at the anastomosis. The lesion was 8 mm in largest dimension.       Biopsies were taken with a cold forceps for histology.      The examined jejunum was normal. Impression:           - Normal esophagus.                       - Multiple gastric polyps. Biopsied.                       - Non-bleeding gastric ulcer with no stigmata of                        bleeding. Biopsied.                       - Normal examined jejunum. Recommendation:       - Perform a colonoscopy today. Procedure Code(s):    --- Professional ---                       [phone], Esophagogastroduodenoscopy, flexible, transoral;                        with biopsy, single or multiple Diagnosis Code(s):    --- Professional ---                       K31.7, Polyp of stomach and duodenum                       K25.9, Gastric ulcer, unspecified as acute or chronic,                        without hemorrhage or perforation                       R10.13, Epigastric pain                       K92.2, Gastrointestinal hemorrhage, unspecified CPT copyright 2016 American Medical Association. All rights reserved. The codes documented in this report are preliminary and upon coder review may  be revised to meet current compliance requirements. Robert Bellow, MD 01/15/2017 11:25:13 AM This report has been signed electronically. Number of Addenda: 0 Note Initiated On: 01/15/2017 11:05 AM      [facility]

## 2017-01-15 NOTE — Op Note (Signed)
[facility] Gastroenterology Patient Name: Kathy Mcintosh Procedure Date: 01/15/2017 11:04 AM MRN: [id number] Account #: [id number] Date of Birth: Aug 12, 1956 Admit Type: Outpatient Age: 61 Room: [facility] ENDO ROOM 1 Gender: Female Note Status: Finalized Procedure:            Colonoscopy Indications:          Screening for colorectal malignant neoplasm Providers:            Robert Bellow, MD Referring MD:         Janine Ores. Rosanna Randy, MD (Referring MD) Medicines:            Monitored Anesthesia Care Complications:        No immediate complications. Procedure:            Pre-Anesthesia Assessment:                       - Prior to the procedure, a History and Physical was                        performed, and patient medications, allergies and                        sensitivities were reviewed. The patient's tolerance of                        previous anesthesia was reviewed.                       - The risks and benefits of the procedure and the                        sedation options and risks were discussed with the                        patient. All questions were answered and informed                        consent was obtained.                       After obtaining informed consent, the colonoscope was                        passed under direct vision. Throughout the procedure,                        the patient's blood pressure, pulse, and oxygen                        saturations were monitored continuously. The                        Colonoscope was introduced through the anus and                        advanced to the the cecum, identified by appendiceal                        orifice and ileocecal valve. The colonoscopy was  somewhat difficult due to a tortuous colon. The patient                        tolerated the procedure well. The quality of the bowel                        preparation was good. Findings:      A few  medium-mouthed diverticula were found in the sigmoid colon.      The retroflexed view of the distal rectum and anal verge was normal and       showed no anal or rectal abnormalities. Impression:           - Diverticulosis in the sigmoid colon.                       - The distal rectum and anal verge are normal on                        retroflexion view.                       - No specimens collected. Recommendation:       - Repeat colonoscopy in 10 years for screening purposes. Procedure Code(s):    --- Professional ---                       [id number], Colonoscopy, flexible; diagnostic, including                        collection of specimen(s) by brushing or washing, when                        performed (separate procedure) Diagnosis Code(s):    --- Professional ---                       Z12.11, Encounter for screening for malignant neoplasm                        of colon                       K57.30, Diverticulosis of large intestine without                        perforation or abscess without bleeding CPT copyright 2016 American Medical Association. All rights reserved. The codes documented in this report are preliminary and upon coder review may  be revised to meet current compliance requirements. Robert Bellow, MD 01/15/2017 11:51:44 AM This report has been signed electronically. Number of Addenda: 0 Note Initiated On: 01/15/2017 11:04 AM Scope Withdrawal Time: 0 hours 9 minutes 45 seconds  Total Procedure Duration: 0 hours 23 minutes 52 seconds       [facility]

## 2017-01-15 NOTE — Transfer of Care (Signed)
Immediate Anesthesia Transfer of Care Note  Patient: Kathy Mcintosh  Procedure(s) Performed: Procedure(s): COLONOSCOPY WITH PROPOFOL (N/A) ESOPHAGOGASTRODUODENOSCOPY (EGD) WITH PROPOFOL (N/A)  Patient Location: PACU  Anesthesia Type:General  Level of Consciousness: awake  Airway & Oxygen Therapy: Patient Spontanous Breathing and Patient connected to nasal cannula oxygen  Post-op Assessment: Report given to RN  Post vital signs: Reviewed  Last Vitals:  Vitals:   01/15/17 1034 01/15/17 1036  BP:  (!) 151/95  Pulse: 84   Resp: 16   Temp: 36.7 C     Last Pain:  Vitals:   01/15/17 1034  TempSrc: Tympanic         Complications: No apparent anesthesia complications

## 2017-01-15 NOTE — Anesthesia Postprocedure Evaluation (Signed)
Anesthesia Post Note  Patient: Kathy Mcintosh  Procedure(s) Performed: Procedure(s) (LRB): COLONOSCOPY WITH PROPOFOL (N/A) ESOPHAGOGASTRODUODENOSCOPY (EGD) WITH PROPOFOL (N/A)  Patient location during evaluation: PACU Anesthesia Type: General Level of consciousness: awake Pain management: pain level controlled Vital Signs Assessment: post-procedure vital signs reviewed and stable Respiratory status: nonlabored ventilation Cardiovascular status: stable Anesthetic complications: no     Last Vitals:  Vitals:   01/15/17 1218 01/15/17 1228  BP: (!) 162/89 (!) 159/78  Pulse: 67 65  Resp: 11 19  Temp:      Last Pain:  Vitals:   01/15/17 1034  TempSrc: Tympanic                 VAN STAVEREN,Nikkita Adeyemi

## 2017-01-15 NOTE — H&P (Signed)
Kathy Mcintosh [id number] [birth year]     HPI: 61 year old woman with history of esophageal spasm managed with omeprazole, candidate for screening colonoscopy.  Prescriptions Prior to Admission  Medication Sig Dispense Refill Last Dose  . aspirin 81 MG tablet Take by mouth.   Taking  . B COMPLEX VITAMINS PO Take by mouth.   Taking  . cholecalciferol (VITAMIN D) 1000 UNITS tablet Take 1,000 Units by mouth daily.   Taking  . levothyroxine (SYNTHROID, LEVOTHROID) 100 MCG tablet TAKE 1 TABLET BY MOUTH DAILY 30 tablet 11 Taking  . Multiple Vitamin (MULTIVITAMIN) LIQD Take 5 mLs by mouth daily.   Taking  . naproxen (NAPROSYN) 500 MG tablet TAKE 1 TABLET BY MOUTH TWICE A DAY 60 tablet 3 Taking  . nitrofurantoin (MACRODANTIN) 50 MG capsule TAKE ONE CAPSULE BY MOUTH EVERY DAY 30 capsule 5 Taking  . omeprazole (PRILOSEC) 20 MG capsule Take 20 mg by mouth daily. Reported on 03/22/2016   Taking  . polyethylene glycol powder (GLYCOLAX/MIRALAX) powder 255 grams one bottle for colonoscopy prep 255 g 0   . ranitidine (ZANTAC) 150 MG tablet Take 150 mg by mouth 2 (two) times daily.   Taking  . Sennosides-Docusate Sodium (STOOL SOFTENER & LAXATIVE PO) Take 1 capsule by mouth daily.   Taking  . sucralfate (CARAFATE) 1 GM/10ML suspension Take 10 mLs (1 g total) by mouth 4 (four) times daily -  with meals and at bedtime. 420 mL 0 Taking  . tamoxifen (NOLVADEX) 20 MG tablet TAKE 1 TABLET BY MOUTH EVERY DAY 30 tablet 8 Taking  . vitamin B-12 (CYANOCOBALAMIN) 1000 MCG tablet Take 1,000 mcg by mouth daily.   Taking   Allergies  Allergen Reactions  . Sulfa Antibiotics Nausea And Vomiting   Past Medical History:  Diagnosis Date  . Esophageal spasm   . Hypothyroidism    Past Surgical History:  Procedure Laterality Date  . ABDOMINAL HYSTERECTOMY  10/14  . BREAST MASS EXCISION Right Jan 2014  . 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 Negative margins.l  .  CHOLECYSTECTOMY  1984  . GASTRIC BYPASS  2004  . KNEE ARTHROSCOPY  Right 2011, Left 2012  . MASS EXCISION Bilateral 11/24/2015   Procedure: LEFT RING FINGER AND RIGHT THUMB MASS EXCISION ;  Surgeon: Roseanne Kaufman, MD;  Location: [place];  Service: Orthopedics;  Laterality: Bilateral;  . MM BREAST STEREO BX*L*R/S  Dec 2013  . pannulectomy     Social History   Social History  . Marital status: Married    Spouse name: N/A  . Number of children: N/A  . Years of education: N/A   Occupational History  . Not on file.   Social History Main Topics  . Smoking status: Never Smoker  . Smokeless tobacco: Never Used  . Alcohol use No  . Drug use: No  . Sexual activity: Not on file   Other Topics Concern  . Not on file   Social History Narrative  . No narrative on file   Social History   Social History Narrative  . No narrative on file     ROS: Negative.     PE: HEENT: Negative. Lungs: Clear. Cardio: RR.  Assessment/Plan:  Proceed with planned upper and lower endoscopy.    Robert Bellow 01/15/2017

## 2017-01-16 ENCOUNTER — Encounter: Payer: Self-pay | Admitting: General Surgery

## 2017-01-16 ENCOUNTER — Telehealth: Payer: Self-pay

## 2017-01-16 LAB — SURGICAL PATHOLOGY

## 2017-01-16 NOTE — Telephone Encounter (Signed)
-----   Message from Robert Bellow, MD sent at 01/16/2017  1:15 PM EST -----  Please notify the patient that the biopsies were benign. No evidence of infection. She should use her Carafate 4 times a day to see if she can get the ulcerated area to heal. I would do this for 1 month, and then decreased to an as needed basis ----- Message ----- From: Interface, Lab In Three Zero One Sent: 01/16/2017  10:38 AM To: Robert Bellow, MD

## 2017-01-16 NOTE — Telephone Encounter (Signed)
Notified patient as instructed, patient pleased. Discussed follow-up appointments, patient agrees  

## 2017-01-30 ENCOUNTER — Other Ambulatory Visit: Payer: Self-pay

## 2017-01-30 ENCOUNTER — Telehealth: Payer: Self-pay

## 2017-01-30 DIAGNOSIS — N6091 Unspecified benign mammary dysplasia of right breast: Secondary | ICD-10-CM

## 2017-01-30 NOTE — Telephone Encounter (Signed)
Message left for the patient to call back to let us know where she wants to get her diagnostic mammogram done at.

## 2017-01-31 ENCOUNTER — Encounter: Payer: Self-pay | Admitting: Family Medicine

## 2017-01-31 ENCOUNTER — Ambulatory Visit (INDEPENDENT_AMBULATORY_CARE_PROVIDER_SITE_OTHER): Payer: 59 | Admitting: Family Medicine

## 2017-01-31 VITALS — BP 124/88 | HR 76 | Temp 98.5°F | Resp 16 | Wt 233.0 lb

## 2017-01-31 DIAGNOSIS — K259 Gastric ulcer, unspecified as acute or chronic, without hemorrhage or perforation: Secondary | ICD-10-CM

## 2017-01-31 DIAGNOSIS — K219 Gastro-esophageal reflux disease without esophagitis: Secondary | ICD-10-CM | POA: Diagnosis not present

## 2017-01-31 MED ORDER — PANTOPRAZOLE SODIUM 40 MG PO TBEC: 40.0000 mg | DELAYED_RELEASE_TABLET | Freq: Every day | ORAL | 3 refills | Status: DC

## 2017-01-31 NOTE — Progress Notes (Signed)
Subjective:     Patient ID: Kathy Mcintosh, female   DOB: 1956-12-15, 61 y.o.   MRN: [id number]  HPI  Chief Complaint  Patient presents with  . Nausea    And Vomiting. Pt has noticed this since having an ulcer that was discovered in April 2017. Pt is taking Omeprazole 20 mg BID and Ranitidine 150 mg BID. Pt is also taking Carafate. Pt believes the sx are coming from reflux, which she believes is secondary to ulcer. Pt also c/o decreased appetite and abdominal pain. Pt denies constipation/diarrhea.  Recent EGD and colonoscopy per Dr. Bary Castilla with gastric polyps and and non-bleeding gastric ulcer. No H.Bacter or dysplasia/malignancy. Reports continues nausea with meals and will have episodes of emesis with certain food especially meats. Currently taking omeprazole and ranitidine once daily. Accompanied by her husband today.   Review of Systems     Objective:   Physical Exam  Constitutional: She appears well-developed and well-nourished.  Abdominal: Soft. There is tenderness (mild epigastric). There is no guarding.       Assessment:    1. Gastroesophageal reflux disease without esophagitis - pantoprazole (PROTONIX) 40 MG tablet; Take 1 tablet (40 mg total) by mouth daily.  Dispense: 30 tablet; Refill: 3  2. Gastric ulcer without hemorrhage or perforation, unspecified chronicity - pantoprazole (PROTONIX) 40 MG tablet; Take 1 tablet (40 mg total) by mouth daily.  Dispense: 30 tablet; Refill: 3   Plan:    Discussed continuing ranitidine in the evening and Carafate 4 x day. Phone f/u in 4 weeks. Consider G.I. Referral. If improved will continue treatment for 8 weeks.

## 2017-01-31 NOTE — Patient Instructions (Signed)
Stop omeprazole and start pantoprazole. Continue Zantac at night and resume carafate before meals and at bedtime. Phone f/u in 4 weeks.

## 2017-03-12 ENCOUNTER — Encounter: Payer: 59 | Admitting: Family Medicine

## 2017-03-27 ENCOUNTER — Encounter: Payer: 59 | Admitting: Physician Assistant

## 2017-03-31 ENCOUNTER — Other Ambulatory Visit: Payer: 59

## 2017-04-02 ENCOUNTER — Telehealth: Payer: Self-pay | Admitting: *Deleted

## 2017-04-02 NOTE — Telephone Encounter (Signed)
Patient aware appointment 4/23 has been cancelled, she will call back to r/s after mammogram is completed.

## 2017-04-07 ENCOUNTER — Ambulatory Visit: Payer: Self-pay | Admitting: General Surgery

## 2017-04-10 ENCOUNTER — Ambulatory Visit
Admission: RE | Admit: 2017-04-10 | Discharge: 2017-04-10 | Disposition: A | Discharge status: Home / Self Care | Payer: 59 | Source: Ambulatory Visit | Attending: General Surgery | Admitting: General Surgery

## 2017-04-10 ENCOUNTER — Ambulatory Visit (INDEPENDENT_AMBULATORY_CARE_PROVIDER_SITE_OTHER): Payer: 59 | Admitting: Family Medicine

## 2017-04-10 ENCOUNTER — Encounter: Payer: Self-pay | Admitting: Family Medicine

## 2017-04-10 VITALS — BP 118/78 | HR 88 | Temp 98.1°F | Resp 12 | Ht 65.5 in | Wt 233.0 lb

## 2017-04-10 DIAGNOSIS — J301 Allergic rhinitis due to pollen: Secondary | ICD-10-CM

## 2017-04-10 DIAGNOSIS — N6091 Unspecified benign mammary dysplasia of right breast: Secondary | ICD-10-CM

## 2017-04-10 DIAGNOSIS — E7800 Pure hypercholesterolemia, unspecified: Secondary | ICD-10-CM | POA: Diagnosis not present

## 2017-04-10 DIAGNOSIS — Z9889 Other specified postprocedural states: Secondary | ICD-10-CM | POA: Diagnosis not present

## 2017-04-10 DIAGNOSIS — Z23 Encounter for immunization: Secondary | ICD-10-CM

## 2017-04-10 DIAGNOSIS — Z0000 Encounter for general adult medical examination without abnormal findings: Secondary | ICD-10-CM | POA: Diagnosis not present

## 2017-04-10 DIAGNOSIS — E039 Hypothyroidism, unspecified: Secondary | ICD-10-CM

## 2017-04-10 DIAGNOSIS — N814 Uterovaginal prolapse, unspecified: Secondary | ICD-10-CM

## 2017-04-10 DIAGNOSIS — R1013 Epigastric pain: Secondary | ICD-10-CM | POA: Diagnosis not present

## 2017-04-10 DIAGNOSIS — K219 Gastro-esophageal reflux disease without esophagitis: Secondary | ICD-10-CM

## 2017-04-10 MED ORDER — LORATADINE 10 MG PO TABS: 10.0000 mg | ORAL_TABLET | Freq: Every day | ORAL | 11 refills | Status: DC

## 2017-04-10 MED ORDER — FLUTICASONE PROPIONATE 50 MCG/ACT NA SUSP: 2.0000 | Freq: Every day | NASAL | 12 refills | Status: DC

## 2017-04-10 MED ORDER — METOCLOPRAMIDE HCL 10 MG PO TABS: 10.0000 mg | ORAL_TABLET | Freq: Three times a day (TID) | ORAL | 11 refills | Status: DC

## 2017-04-10 NOTE — Progress Notes (Signed)
Patient: Kathy Mcintosh, Female    DOB: [birth year], 61 y.o.   MRN: [id number] Visit Date: 04/10/2017  Today's Provider: Wilhemena Durie, MD   Chief Complaint  Patient presents with  . Annual Exam   Subjective:  Kathy Mcintosh is a 61 y.o. female who presents today for health maintenance and complete physical. She feels fairly well. She reports exercising not at this time. She reports she is sleeping well. Immunization History  Administered Date(s) Administered  . Td 09/25/2004   Wt Readings from Last 3 Encounters:  04/10/17 233 lb (105.7 kg)  01/31/17 233 lb (105.7 kg)  01/15/17 227 lb (103 kg)   Last Colonoscopy 01/15/17 diverticulosis, otherwise normal-repeat 2028  Endoscopy 01/15/17 chronic gastritis, mild chronic active inflammation, fibrinopurulent debris  Mammogram 03/29/16 normal  Pap smear  Review of Systems  Constitutional: Negative.   HENT: Positive for postnasal drip and sneezing.   Eyes: Negative.   Respiratory: Negative.   Cardiovascular: Positive for leg swelling.  Gastrointestinal: Negative.   Endocrine: Negative.   Genitourinary: Negative.   Musculoskeletal: Negative.   Skin: Negative.   Allergic/Immunologic: Positive for environmental allergies.  Neurological: Negative.   Hematological: Negative.   Psychiatric/Behavioral: Negative.     Social History   Social History  . Marital status: Married    Spouse name: N/A  . Number of children: N/A  . Years of education: N/A   Occupational History  . Not on file.   Social History Main Topics  . Smoking status: Never Smoker  . Smokeless tobacco: Never Used  . Alcohol use No  . Drug use: No  . Sexual activity: Not on file   Other Topics Concern  . Not on file   Social History Narrative  . No narrative on file    Patient Active Problem List   Diagnosis Date Noted  . Encounter for screening colonoscopy 01/06/2017  . Epigastric pain 01/06/2017  . Arthritis 10/25/2015  . Age-related memory  disorder 10/25/2015  . Bladder cystocele 10/25/2015  . Acid reflux 10/25/2015  . Hypercholesteremia 10/25/2015  . BP (high blood pressure) 10/25/2015  . Cannot sleep 10/25/2015  . Headache, migraine 10/25/2015  . 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 B12 deficiency 10/25/2015  . Avitaminosis D 10/25/2015  . Adiposity 09/21/2013  . Adult hypothyroidism 09/21/2013  . Atypical ductal hyperplasia, breast 06/22/2013  . Hypothyroidism 11/28/2012    Class: Chronic  . Vitamin D deficiency disease 11/28/2012    Past Surgical History:  Procedure Laterality Date  . ABDOMINAL HYSTERECTOMY  10/14  . BREAST MASS EXCISION Right Jan 2014  . 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 Negative margins.l  . CHOLECYSTECTOMY  1984  . COLONOSCOPY WITH PROPOFOL N/A 01/15/2017   Procedure: COLONOSCOPY WITH PROPOFOL;  Surgeon: Robert Bellow, MD;  Location: [facility] ENDOSCOPY;  Service: Endoscopy;  Laterality: N/A;  . ESOPHAGOGASTRODUODENOSCOPY (EGD) WITH PROPOFOL N/A 01/15/2017   Procedure: ESOPHAGOGASTRODUODENOSCOPY (EGD) WITH PROPOFOL;  Surgeon: Robert Bellow, MD;  Location: [facility] ENDOSCOPY;  Service: Endoscopy;  Laterality: N/A;  . GASTRIC BYPASS  2004  . KNEE ARTHROSCOPY  Right 2011, Left 2012  . MASS EXCISION Bilateral 11/24/2015   Procedure: LEFT RING FINGER AND RIGHT THUMB MASS EXCISION ;  Surgeon: Roseanne Kaufman, MD;  Location: [place];  Service: Orthopedics;  Laterality: Bilateral;  . MM BREAST STEREO BX*L*R/S  Dec 2013  . pannulectomy      Her family history includes Asthma  in her daughter and mother; Breast cancer in her sister; Breast cancer (age of onset: 6) in her paternal aunt; COPD in her father and mother; Cervical cancer in her mother and sister; Dementia in her mother; Heart disease in her father; Heart failure in her father; Hypertension in her mother; Lung cancer in her mother.     Outpatient Encounter Prescriptions  as of 04/10/2017  Medication Sig Note  . aspirin 81 MG tablet Take by mouth. 10/25/2015: Received from: [facility]  . B COMPLEX VITAMINS PO Take by mouth. 10/25/2015: Received from: [facility]  . cholecalciferol (VITAMIN D) 1000 UNITS tablet Take 1,000 Units by mouth daily.   Marland Kitchen levothyroxine (SYNTHROID, LEVOTHROID) 100 MCG tablet TAKE 1 TABLET BY MOUTH DAILY   . Multiple Vitamin (MULTIVITAMIN) LIQD Take 5 mLs by mouth daily.   . naproxen (NAPROSYN) 500 MG tablet TAKE 1 TABLET BY MOUTH TWICE A DAY   . nitrofurantoin (MACRODANTIN) 50 MG capsule TAKE ONE CAPSULE BY MOUTH EVERY DAY   . omeprazole (PRILOSEC) 20 MG capsule Take 20 mg by mouth daily. Reported on 03/22/2016   . pantoprazole (PROTONIX) 40 MG tablet Take 1 tablet (40 mg total) by mouth daily.   . ranitidine (ZANTAC) 150 MG tablet Take 150 mg by mouth 2 (two) times daily.   [name] (STOOL SOFTENER & LAXATIVE PO) Take 1 capsule by mouth daily.   . sucralfate (CARAFATE) 1 GM/10ML suspension Take 10 mLs (1 g total) by mouth 4 (four) times daily -  with meals and at bedtime.   . tamoxifen (NOLVADEX) 20 MG tablet TAKE 1 TABLET BY MOUTH EVERY DAY   . vitamin B-12 (CYANOCOBALAMIN) 1000 MCG tablet Take 1,000 mcg by mouth daily.    No facility-administered encounter medications on file as of 04/10/2017.     Patient Care Team: Jerrol Banana., MD as PCP - General (Family Medicine) Robert Bellow, MD (General Surgery)      Objective:   Vitals:  Vitals:   04/10/17 0839  BP: 118/78  Pulse: 88  Resp: 12  Temp: 98.1 F (36.7 C)  Weight: 233 lb (105.7 kg)  Height: 5' 5.5" (1.664 m)    Physical Exam  Constitutional: She is oriented to person, place, and time. She appears well-developed and well-nourished.  Pleasant obese WF NAD.  HENT:  Head: Normocephalic and atraumatic.  Right Ear: External ear normal.  Left Ear: External ear normal.  Mouth/Throat: Oropharynx is clear  and moist.  Dentures in place.  Eyes: Conjunctivae are normal. Pupils are equal, round, and reactive to light.  Neck: Normal range of motion. Neck supple.  Cardiovascular: Normal rate, regular rhythm, normal heart sounds and intact distal pulses.   No murmur heard. Pulmonary/Chest: Effort normal and breath sounds normal. No respiratory distress. She has no wheezes.  Abdominal: She exhibits no distension. There is no tenderness.  Musculoskeletal: She exhibits no edema or tenderness.  Neurological: She is alert and oriented to person, place, and time. No cranial nerve deficit. Coordination normal.  Skin: No rash noted. No erythema.  Psychiatric: She has a normal mood and affect. Her behavior is normal. Judgment and thought content normal.     Depression Screen PHQ 2/9 Scores 04/10/2017  PHQ - 2 Score 0  PHQ- 9 Score 0    Assessment & Plan:    1. Annual physical exam Patient see Dr Widner-urogynecology. Check labs today. - CBC w/Diff/Platelet - Comprehensive metabolic panel - TSH  2. Gastroesophageal reflux  disease without esophagitis Stable.  3. Adult hypothyroidism - TSH  4. Uterovaginal prolapse  5. Hypercholesteremia - Comprehensive metabolic panel - Lipid Panel With LDL/HDL Ratio  6. Need for Tdap vaccination Tdap administered today.  7. Epigastric pain Check labs. Start Reglan - H. pylori antibody, IgG - Lipase - metoCLOPramide (REGLAN) 10 MG tablet; Take 1 tablet (10 mg total) by mouth 4 (four) times daily -  before meals and at bedtime.  Dispense: 90 tablet; Refill: 11  8. Seasonal allergic rhinitis due to pollen Try Claritin and Flonase spray. Follow as needed. - loratadine (CLARITIN) 10 MG tablet; Take 1 tablet (10 mg total) by mouth daily.  Dispense: 30 tablet; Refill: 11 - fluticasone (FLONASE) 50 MCG/ACT nasal spray; Place 2 sprays into both nostrils daily.  Dispense: 16 g; Refill: 12 9.S/p gastric bypass--Roux-N-Y/s/p cholecystectomy.  HPI, Exam and  A&P transcribed by Theressa Millard, RMA under direction and in the presence of Miguel Aschoff, MD.  Discussed health benefits of physical activity, and encouraged her to engage in regular exercise appropriate for her age and condition.  I have done the exam and reviewed the chart and it is accurate to the best of my knowledge. Development worker, community has been used and  any errors in dictation or transcription are unintentional. Miguel Aschoff M.D. [facility]

## 2017-04-11 LAB — COMPREHENSIVE METABOLIC PANEL
ALBUMIN: 4 g/dL (ref 3.6–4.8)
ALT: 14 IU/L (ref 0–32)
AST: 18 IU/L (ref 0–40)
Albumin/Globulin Ratio: 1.5 (ref 1.2–2.2)
Alkaline Phosphatase: 93 IU/L (ref 39–117)
BUN / CREAT RATIO: 17 (ref 12–28)
BUN: 10 mg/dL (ref 8–27)
Bilirubin Total: 0.4 mg/dL (ref 0.0–1.2)
CALCIUM: 9 mg/dL (ref 8.7–10.3)
CO2: 24 mmol/L (ref 18–29)
Chloride: 103 mmol/L (ref 96–106)
Creatinine, Ser: 0.6 mg/dL (ref 0.57–1.00)
GFR, EST AFRICAN AMERICAN: 113 mL/min/{1.73_m2} (ref >59)
GFR, EST NON AFRICAN AMERICAN: 98 mL/min/{1.73_m2} (ref >59)
Globulin, Total: 2.6 g/dL (ref 1.5–4.5)
Glucose: 90 mg/dL (ref 65–99)
Potassium: 4.6 mmol/L (ref 3.5–5.2)
Sodium: 142 mmol/L (ref 134–144)
TOTAL PROTEIN: 6.6 g/dL (ref 6.0–8.5)

## 2017-04-11 LAB — CBC WITH DIFFERENTIAL/PLATELET
BASOS ABS: 0.1 10*3/uL (ref 0.0–0.2)
BASOS: 1 %
EOS (ABSOLUTE): 0.1 10*3/uL (ref 0.0–0.4)
Eos: 2 %
Hematocrit: 37.2 % (ref 34.0–46.6)
Hemoglobin: 12.4 g/dL (ref 11.1–15.9)
Immature Grans (Abs): 0 10*3/uL (ref 0.0–0.1)
Immature Granulocytes: 0 %
LYMPHS ABS: 2.1 10*3/uL (ref 0.7–3.1)
Lymphs: 29 %
MCH: 29.9 pg (ref 26.6–33.0)
MCHC: 33.3 g/dL (ref 31.5–35.7)
MCV: 90 fL (ref 79–97)
MONOS ABS: 0.7 10*3/uL (ref 0.1–0.9)
Monocytes: 10 %
Neutrophils Absolute: 4.2 10*3/uL (ref 1.4–7.0)
Neutrophils: 58 %
PLATELETS: 255 10*3/uL (ref 150–379)
RBC: 4.15 x10E6/uL (ref 3.77–5.28)
RDW: 14.4 % (ref 12.3–15.4)
WBC: 7.2 10*3/uL (ref 3.4–10.8)

## 2017-04-11 LAB — LIPID PANEL WITH LDL/HDL RATIO
Cholesterol, Total: 157 mg/dL (ref 100–199)
HDL: 59 mg/dL (ref >39)
LDL Calculated: 76 mg/dL (ref 0–99)
LDL/HDL RATIO: 1.3 ratio (ref 0.0–3.2)
Triglycerides: 109 mg/dL (ref 0–149)
VLDL CHOLESTEROL CAL: 22 mg/dL (ref 5–40)

## 2017-04-11 LAB — TSH: TSH: 2.87 u[IU]/mL (ref 0.450–4.500)

## 2017-05-01 ENCOUNTER — Ambulatory Visit: Payer: Self-pay | Admitting: General Surgery

## 2017-05-07 ENCOUNTER — Encounter: Payer: Self-pay | Admitting: General Surgery

## 2017-05-07 ENCOUNTER — Ambulatory Visit (INDEPENDENT_AMBULATORY_CARE_PROVIDER_SITE_OTHER): Payer: 59 | Admitting: General Surgery

## 2017-05-07 VITALS — BP 132/80 | HR 72 | Resp 14 | Ht 67.0 in | Wt 236.0 lb

## 2017-05-07 DIAGNOSIS — N6091 Unspecified benign mammary dysplasia of right breast: Secondary | ICD-10-CM | POA: Diagnosis not present

## 2017-05-07 NOTE — Progress Notes (Signed)
Patient ID: Kathy Mcintosh, female   DOB: 1956/03/31, 61 y.o.   MRN: [id number]  Chief Complaint  Patient presents with  . Follow-up    mammogram    HPI Kathy Mcintosh is a 61 y.o. female.  who presents for a breast evaluation. The most recent mammogram was done on 04-10-17.  Patient does perform regular self breast checks and gets regular mammograms done.   No new breast issues. She states she doesn't feel good, sinus trouble. She is an Therapist, sports with Heart Failure patients.  HPI  Past Medical History:  Diagnosis Date  . Atypical ductal hyperplasia of right breast 2014  . Esophageal spasm   . Hypothyroidism     Past Surgical History:  Procedure Laterality Date  . ABDOMINAL HYSTERECTOMY  10/14  . BREAST BIOPSY Right 12/07/2012   stereo biopsy - positive  . BREAST LUMPECTOMY Right 01/01/2013   ADH -   . BREAST MASS EXCISION Right Jan 2014  . 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 Negative margins.l  . CHOLECYSTECTOMY  1984  . COLONOSCOPY WITH PROPOFOL N/A 01/15/2017   Procedure: COLONOSCOPY WITH PROPOFOL;  Surgeon: Robert Bellow, MD;  Location: [facility] ENDOSCOPY;  Service: Endoscopy;  Laterality: N/A;  . ESOPHAGOGASTRODUODENOSCOPY (EGD) WITH PROPOFOL N/A 01/15/2017   Procedure: ESOPHAGOGASTRODUODENOSCOPY (EGD) WITH PROPOFOL;  Surgeon: Robert Bellow, MD;  Location: [facility] ENDOSCOPY;  Service: Endoscopy;  Laterality: N/A;  . GASTRIC BYPASS  2004  . KNEE ARTHROSCOPY  Right 2011, Left 2012  . MASS EXCISION Bilateral 11/24/2015   Procedure: LEFT RING FINGER AND RIGHT THUMB MASS EXCISION ;  Surgeon: Roseanne Kaufman, MD;  Location: [place];  Service: Orthopedics;  Laterality: Bilateral;  . MM BREAST STEREO BX*L*R/S  Dec 2013  . pannulectomy      Family History  Problem Relation Age of Onset  . Lung cancer Mother   . Cervical cancer Mother   . Hypertension Mother   . Asthma Mother   . COPD Mother   . Dementia Mother    . Cervical cancer Sister   . Breast cancer Sister   . Heart failure Father   . Heart disease Father   . COPD Father   . Asthma Daughter   . Breast cancer Paternal Aunt 14    Social History Social History  Substance Use Topics  . Smoking status: Never Smoker  . Smokeless tobacco: Never Used  . Alcohol use No    Allergies  Allergen Reactions  . Sulfa Antibiotics Nausea And Vomiting    Current Outpatient Prescriptions  Medication Sig Dispense Refill  . aspirin 81 MG tablet Take by mouth.    . B COMPLEX VITAMINS PO Take by mouth.    . cholecalciferol (VITAMIN D) 1000 UNITS tablet Take 1,000 Units by mouth daily.    . fluticasone (FLONASE) 50 MCG/ACT nasal spray Place 2 sprays into both nostrils daily. 16 g 12  . levothyroxine (SYNTHROID, LEVOTHROID) 100 MCG tablet TAKE 1 TABLET BY MOUTH DAILY 30 tablet 11  . loratadine (CLARITIN) 10 MG tablet Take 1 tablet (10 mg total) by mouth daily. 30 tablet 11  . metoCLOPramide (REGLAN) 10 MG tablet Take 1 tablet (10 mg total) by mouth 4 (four) times daily -  before meals and at bedtime. 90 tablet 11  . Multiple Vitamin (MULTIVITAMIN) LIQD Take 5 mLs by mouth daily.    . naproxen (NAPROSYN) 500 MG tablet TAKE 1 TABLET BY MOUTH  TWICE A DAY 60 tablet 3  . nitrofurantoin (MACRODANTIN) 50 MG capsule TAKE ONE CAPSULE BY MOUTH EVERY DAY 30 capsule 5  . pantoprazole (PROTONIX) 40 MG tablet Take 1 tablet (40 mg total) by mouth daily. 30 tablet 3  . ranitidine (ZANTAC) 150 MG tablet Take 150 mg by mouth 2 (two) times daily.    . sucralfate (CARAFATE) 1 GM/10ML suspension Take 10 mLs (1 g total) by mouth 4 (four) times daily -  with meals and at bedtime. 420 mL 0  . tamoxifen (NOLVADEX) 20 MG tablet TAKE 1 TABLET BY MOUTH EVERY DAY 30 tablet 8  . vitamin B-12 (CYANOCOBALAMIN) 1000 MCG tablet Take 1,000 mcg by mouth daily.     No current facility-administered medications for this visit.     Review of Systems Review of Systems  Constitutional:  Negative.   Respiratory: Negative.   Cardiovascular: Negative.     Blood pressure 132/80, pulse 72, resp. rate 14, height 5\' 7"  (1.702 m), weight 236 lb (107 kg), last menstrual period 11/28/1990.  Physical Exam Physical Exam  Constitutional: She is oriented to person, place, and time. She appears well-developed and well-nourished.  HENT:  Mouth/Throat: Oropharynx is clear and moist.  Eyes: Conjunctivae are normal. No scleral icterus.  Neck: Neck supple.  Cardiovascular: Normal rate, regular rhythm and normal heart sounds.   Pulmonary/Chest: Effort normal and breath sounds normal. Right breast exhibits no inverted nipple, no mass, no nipple discharge, no skin change and no tenderness. Left breast exhibits no inverted nipple, no mass, no nipple discharge, no skin change and no tenderness.    Heat rash below breast.  Lymphadenopathy:    She has no cervical adenopathy.    She has no axillary adenopathy.  Neurological: She is alert and oriented to person, place, and time.  Skin: Skin is warm and dry.  1 cm sebaceous cyst left occipital scalp;p   Psychiatric: Her behavior is normal.    Data Reviewed Bilateral mammograms dated 04/10/2017 were reviewed. BI-RADS-2.  Assessment    Stable breast exam.  Good tolerance of antiestrogen therapy.  Somnolence with recently instituted Reglan therapy.      Plan    The patient has avoided nonsteroidals as requested a stone her recent upper endoscopy in January of this year.  She's been encouraged to make use of a half dose of Reglan prior to meals and to admit the evening dose.    Recommend Gold Bond powder to heat rash area. Reglan 5 mg  before meals and omit Hs dose. The patient has been asked to return to the office in one year with a bilateral diagnostic mammogram. The patient is aware to call back for any questions or concerns.   HPI, Physical Exam, Assessment and Plan have been scribed under the direction and in the presence  of Robert Bellow, MD.  Karie Fetch, RN  I have completed the exam and reviewed the above documentation for accuracy and completeness.  I agree with the above.  Haematologist has been used and any errors in dictation or transcription are unintentional.  Hervey Ard, M.D., F.A.C.S.   Robert Bellow 05/08/2017, 7:09 AM

## 2017-05-07 NOTE — Patient Instructions (Addendum)
  The patient has been asked to return to the office in one year with a bilateral diagnostic mammogram. The patient is aware to call back for any questions or concerns. \Recommend Gold Bond powder to heat rash area. Reglan 5 mg  before meals and omit Hs dose

## 2017-05-08 ENCOUNTER — Ambulatory Visit: Payer: 59 | Admitting: Family Medicine

## 2017-05-27 ENCOUNTER — Encounter: Payer: Self-pay | Admitting: Family Medicine

## 2017-05-27 ENCOUNTER — Ambulatory Visit (INDEPENDENT_AMBULATORY_CARE_PROVIDER_SITE_OTHER): Payer: 59 | Admitting: Family Medicine

## 2017-05-27 VITALS — BP 112/64 | HR 80 | Resp 16 | Wt 239.0 lb

## 2017-05-27 DIAGNOSIS — R110 Nausea: Secondary | ICD-10-CM | POA: Diagnosis not present

## 2017-05-27 MED ORDER — ONDANSETRON HCL 8 MG PO TABS: 8.0000 mg | ORAL_TABLET | Freq: Three times a day (TID) | ORAL | 0 refills | Status: AC | PRN

## 2017-05-27 NOTE — Patient Instructions (Signed)
Stop all red meat and pork.

## 2017-05-27 NOTE — Progress Notes (Signed)
Patient: Kathy Mcintosh Female    DOB: [birth year]   61 y.o.   MRN: [id number] Visit Date: 05/27/2017  Today's Provider: Wilhemena Durie, MD   Chief Complaint  Patient presents with  . Abdominal Pain    follow up    Subjective:    HPI Patient comes in today for a follow up. She was seen in the office about 4 weeks ago c/o epigastric pain. She was started on Reglan, and she reports that she only took the medication for about 4-5 days. She reports that the medication made her very tired and sleepy. She still has nausea and vomiting that happens mainly after she eats. The last time that she vomited was about 2 days ago. Her labs on 04/10/17 was WNL.  Pt s/p Roux nY procedure /gastric bypass.    Allergies  Allergen Reactions  . Sulfa Antibiotics Nausea And Vomiting     Current Outpatient Prescriptions:  .  aspirin 81 MG tablet, Take by mouth., Disp: , Rfl:  .  B COMPLEX VITAMINS PO, Take by mouth., Disp: , Rfl:  .  cholecalciferol (VITAMIN D) 1000 UNITS tablet, Take 1,000 Units by mouth daily., Disp: , Rfl:  .  fluticasone (FLONASE) 50 MCG/ACT nasal spray, Place 2 sprays into both nostrils daily., Disp: 16 g, Rfl: 12 .  levothyroxine (SYNTHROID, LEVOTHROID) 100 MCG tablet, TAKE 1 TABLET BY MOUTH DAILY, Disp: 30 tablet, Rfl: 11 .  Multiple Vitamin (MULTIVITAMIN) LIQD, Take 5 mLs by mouth daily., Disp: , Rfl:  .  naproxen (NAPROSYN) 500 MG tablet, TAKE 1 TABLET BY MOUTH TWICE A DAY, Disp: 60 tablet, Rfl: 3 .  nitrofurantoin (MACRODANTIN) 50 MG capsule, TAKE ONE CAPSULE BY MOUTH EVERY DAY, Disp: 30 capsule, Rfl: 5 .  pantoprazole (PROTONIX) 40 MG tablet, Take 1 tablet (40 mg total) by mouth daily., Disp: 30 tablet, Rfl: 3 .  ranitidine (ZANTAC) 150 MG tablet, Take 150 mg by mouth 2 (two) times daily., Disp: , Rfl:  .  sucralfate (CARAFATE) 1 GM/10ML suspension, Take 10 mLs (1 g total) by mouth 4 (four) times daily -  with meals and at bedtime., Disp: 420 mL, Rfl: 0 .  tamoxifen  (NOLVADEX) 20 MG tablet, TAKE 1 TABLET BY MOUTH EVERY DAY, Disp: 30 tablet, Rfl: 8 .  vitamin B-12 (CYANOCOBALAMIN) 1000 MCG tablet, Take 1,000 mcg by mouth daily., Disp: , Rfl:  .  loratadine (CLARITIN) 10 MG tablet, Take 1 tablet (10 mg total) by mouth daily., Disp: 30 tablet, Rfl: 11 .  metoCLOPramide (REGLAN) 10 MG tablet, Take 1 tablet (10 mg total) by mouth 4 (four) times daily -  before meals and at bedtime. (Patient not taking: Reported on 05/27/2017), Disp: 90 tablet, Rfl: 11  Review of Systems  Constitutional: Positive for appetite change and fatigue.  Eyes: Negative.   Respiratory: Negative.   Cardiovascular: Negative.   Gastrointestinal: Positive for abdominal pain, nausea and vomiting.  Endocrine: Negative.   Genitourinary: Negative.   Musculoskeletal: Negative.   Allergic/Immunologic: Negative.   Neurological: Negative for headaches.  Psychiatric/Behavioral: Negative.     Social History  Substance Use Topics  . Smoking status: Never Smoker  . Smokeless tobacco: Never Used  . Alcohol use No   Objective:   BP 112/64 (BP Location: Right Arm, Patient Position: Sitting, Cuff Size: Large)   Pulse 80   Resp 16   Wt 239 lb (108.4 kg)   LMP 11/28/1990   SpO2 96%   BMI  37.43 kg/m  Vitals:   05/27/17 1531  BP: 112/64  Pulse: 80  Resp: 16  SpO2: 96%  Weight: 239 lb (108.4 kg)     Physical Exam  Constitutional: She is oriented to person, place, and time. She appears well-developed and well-nourished.  Obese pt who looks good.  HENT:  Head: Normocephalic and atraumatic.  Eyes: Conjunctivae are normal. No scleral icterus.  Neck: No thyromegaly present.  Cardiovascular: Normal rate, regular rhythm and normal heart sounds.   Pulmonary/Chest: Effort normal and breath sounds normal.  Abdominal: Soft. Bowel sounds are normal.  Neurological: She is alert and oriented to person, place, and time.  Skin: Skin is warm and dry.  Psychiatric: She has a normal mood and  affect. Her behavior is normal. Judgment and thought content normal.        Assessment & Plan:     1. Nausea Stop Naproxen/NSAID. - ondansetron (ZOFRAN) 8 MG tablet; Take 1 tablet (8 mg total) by mouth every 8 (eight) hours as needed for nausea or vomiting.  Dispense: 60 tablet; Refill: 0 2.Epigastric Pain May need GI referral.  3.Obesity     I have done the exam and reviewed the above chart and it is accurate to the best of my knowledge. Development worker, community has been used in this note in any air is in the dictation or transcription are unintentional.  Wilhemena Durie, MD  [place]

## 2017-06-07 ENCOUNTER — Other Ambulatory Visit: Payer: Self-pay | Admitting: Family Medicine

## 2017-06-07 DIAGNOSIS — K219 Gastro-esophageal reflux disease without esophagitis: Secondary | ICD-10-CM

## 2017-06-07 DIAGNOSIS — K259 Gastric ulcer, unspecified as acute or chronic, without hemorrhage or perforation: Secondary | ICD-10-CM

## 2017-06-10 ENCOUNTER — Ambulatory Visit (INDEPENDENT_AMBULATORY_CARE_PROVIDER_SITE_OTHER): Payer: 59 | Admitting: Family Medicine

## 2017-06-10 VITALS — BP 118/84 | HR 62 | Temp 98.2°F | Resp 14 | Wt 241.0 lb

## 2017-06-10 DIAGNOSIS — R110 Nausea: Secondary | ICD-10-CM | POA: Diagnosis not present

## 2017-06-10 DIAGNOSIS — R1013 Epigastric pain: Secondary | ICD-10-CM | POA: Diagnosis not present

## 2017-06-10 NOTE — Progress Notes (Signed)
Kathy Mcintosh  MRN: [id number] DOB: 15-Mar-1956  Subjective:  HPI  Patient is here to follow up from last office visit on 05/27/17 for nauseas and epigastric pain. Patient states she stopped naproxen as directed. She did not have to take Zofran for nausea, has not had episode of nausea except 1 time after eating chicken, has not been eating any other meats. No more pain like she had. She also would like to discuss an issue with her left eye. Friday June 22nd she noticed at lunch that she had a blood vessel burst. Did not have any sensation when this happened. The following day eye felt tender. No discharge noted.  Patient Active Problem List   Diagnosis Date Noted  . Encounter for screening colonoscopy 01/06/2017  . Epigastric pain 01/06/2017  . Arthritis 10/25/2015  . Age-related memory disorder 10/25/2015  . Bladder cystocele 10/25/2015  . Acid reflux 10/25/2015  . Hypercholesteremia 10/25/2015  . BP (high blood pressure) 10/25/2015  . Cannot sleep 10/25/2015  . Headache, migraine 10/25/2015  . 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 B12 deficiency 10/25/2015  . Avitaminosis D 10/25/2015  . Adiposity 09/21/2013  . Adult hypothyroidism 09/21/2013  . Uterovaginal prolapse 07/23/2013  . Atypical ductal hyperplasia, breast 06/22/2013  . Hypothyroidism 11/28/2012    Class: Chronic  . Vitamin D deficiency disease 11/28/2012    Past Medical History:  Diagnosis Date  . Atypical ductal hyperplasia of right breast 2014  . Esophageal spasm   . Hypothyroidism     Social History   Social History  . Marital status: Married    Spouse name: N/A  . Number of children: N/A  . Years of education: N/A   Occupational History  . Not on file.   Social History Main Topics  . Smoking status: Never Smoker  . Smokeless tobacco: Never Used  . Alcohol use No  . Drug use: No  . Sexual activity: Not on file   Other Topics Concern  . Not on file   Social History Narrative  . No  narrative on file    Outpatient Encounter Prescriptions as of 06/10/2017  Medication Sig Note  . aspirin 81 MG tablet Take by mouth. 10/25/2015: Received from: [facility]  . B COMPLEX VITAMINS PO Take by mouth. 10/25/2015: Received from: [facility]  . cholecalciferol (VITAMIN D) 1000 UNITS tablet Take 1,000 Units by mouth daily.   . fluticasone (FLONASE) 50 MCG/ACT nasal spray Place 2 sprays into both nostrils daily.   Marland Kitchen levothyroxine (SYNTHROID, LEVOTHROID) 100 MCG tablet TAKE 1 TABLET BY MOUTH DAILY   . loratadine (CLARITIN) 10 MG tablet Take 1 tablet (10 mg total) by mouth daily.   . metoCLOPramide (REGLAN) 10 MG tablet Take 1 tablet (10 mg total) by mouth 4 (four) times daily -  before meals and at bedtime.   . Multiple Vitamin (MULTIVITAMIN) LIQD Take 5 mLs by mouth daily.   . nitrofurantoin (MACRODANTIN) 50 MG capsule TAKE ONE CAPSULE BY MOUTH EVERY DAY   . ondansetron (ZOFRAN) 8 MG tablet Take 1 tablet (8 mg total) by mouth every 8 (eight) hours as needed for nausea or vomiting.   . pantoprazole (PROTONIX) 40 MG tablet TAKE 1 TABLET (40 MG TOTAL) BY MOUTH DAILY.   . ranitidine (ZANTAC) 150 MG tablet Take 150 mg by mouth 2 (two) times daily.   . tamoxifen (NOLVADEX) 20 MG tablet TAKE 1 TABLET BY MOUTH EVERY DAY   . vitamin B-12 (CYANOCOBALAMIN) 1000 MCG tablet  Take 1,000 mcg by mouth daily.   . [DISCONTINUED] naproxen (NAPROSYN) 500 MG tablet TAKE 1 TABLET BY MOUTH TWICE A DAY   . [DISCONTINUED] sucralfate (CARAFATE) 1 GM/10ML suspension Take 10 mLs (1 g total) by mouth 4 (four) times daily -  with meals and at bedtime.    No facility-administered encounter medications on file as of 06/10/2017.     Allergies  Allergen Reactions  . Sulfa Antibiotics Nausea And Vomiting    Review of Systems  Constitutional: Negative.   Eyes: Positive for redness.  Respiratory: Negative.   Cardiovascular: Negative.   Gastrointestinal: Negative.   Genitourinary:  Negative.   Skin: Negative.   Neurological: Negative.   Endo/Heme/Allergies: Negative.   Psychiatric/Behavioral: Negative.     Objective:  BP 118/84   Pulse 62   Temp 98.2 F (36.8 C)   Resp 14   Wt 241 lb (109.3 kg)   LMP 11/28/1990   BMI 37.75 kg/m   Physical Exam  Constitutional: She is oriented to person, place, and time and well-developed, well-nourished, and in no distress.  HENT:  Head: Normocephalic.  Eyes: No scleral icterus.  The entire lateral half of the left eye has  conjunctival hemorrhages  Neck: No thyromegaly present.  Cardiovascular: Normal rate, regular rhythm and normal heart sounds.   Pulmonary/Chest: Effort normal and breath sounds normal.  Abdominal: Soft.  Neurological: She is alert and oriented to person, place, and time. Gait normal. GCS score is 15.  Skin: Skin is warm and dry.  Psychiatric: Mood, memory, affect and judgment normal.    Assessment and Plan :  1. Nausea Better. Will get labs. Follow. Pending results.May need referral back to bariatric surgeon if necessary. - Alpha-Gal Panel  2. Epigastric pain Resolved at this time. 3. Conjunctival hemorrhage No visual disturbance. No pain. Use hot compresses and refer to ophthalmology if she worsens. 4. Status post Roux-en-Y for gastric bypass HPI, Exam and A&P transcribed by Theressa Millard, RMA under direction and in the presence of Miguel Aschoff, MD. I have done the exam and reviewed the chart and it is accurate to the best of my knowledge. Development worker, community has been used and  any errors in dictation or transcription are unintentional. Miguel Aschoff M.D. [facility]

## 2017-06-14 LAB — ALPHA-GAL PANEL
Alpha Gal IgE*: 0.23 kU/L (ref <0.35)
Beef (Bos spp) IgE: 0.1 kU/L (ref <0.35)
LAMB CLASS INTERPRETATION: 0
PORK CLASS INTERPRETATION: 0
Pork (Sus spp) IgE: <0.1 kU/L (ref <0.35)

## 2017-06-19 NOTE — Progress Notes (Signed)
Advised  ED 

## 2017-06-24 ENCOUNTER — Telehealth: Payer: Self-pay

## 2017-06-24 DIAGNOSIS — Z6837 Body mass index (BMI) 37.0-37.9, adult: Secondary | ICD-10-CM

## 2017-06-24 NOTE — Telephone Encounter (Signed)
Order placed. Please schedule-aa

## 2017-06-24 NOTE — Telephone Encounter (Signed)
-----   Message from Jerrol Banana., MD sent at 06/23/2017  1:14 PM EDT ----- [name] in [place].

## 2017-07-31 ENCOUNTER — Other Ambulatory Visit: Payer: Self-pay

## 2017-07-31 MED ORDER — NITROFURANTOIN MACROCRYSTAL 50 MG PO CAPS: 50.0000 mg | ORAL_CAPSULE | Freq: Every day | ORAL | 5 refills | Status: DC

## 2017-07-31 NOTE — Telephone Encounter (Signed)
[facility] [place]

## 2017-08-01 ENCOUNTER — Other Ambulatory Visit: Payer: Self-pay | Admitting: Family Medicine

## 2017-08-11 ENCOUNTER — Encounter: Payer: Self-pay | Admitting: Family Medicine

## 2017-08-11 ENCOUNTER — Ambulatory Visit (INDEPENDENT_AMBULATORY_CARE_PROVIDER_SITE_OTHER): Payer: 59 | Admitting: Family Medicine

## 2017-08-11 VITALS — BP 118/68 | HR 82 | Temp 98.4°F | Resp 16 | Wt 248.0 lb

## 2017-08-11 DIAGNOSIS — R110 Nausea: Secondary | ICD-10-CM | POA: Diagnosis not present

## 2017-08-11 DIAGNOSIS — R1013 Epigastric pain: Secondary | ICD-10-CM

## 2017-08-11 DIAGNOSIS — K219 Gastro-esophageal reflux disease without esophagitis: Secondary | ICD-10-CM

## 2017-08-11 DIAGNOSIS — Z9884 Bariatric surgery status: Secondary | ICD-10-CM | POA: Diagnosis not present

## 2017-08-11 NOTE — Progress Notes (Signed)
Patient: Kathy Mcintosh Female    DOB: 1956/01/22   61 y.o.   MRN: [id number] Visit Date: 08/11/2017  Today's Provider: Wilhemena Durie, MD   Chief Complaint  Patient presents with  . Follow-up   Subjective:    HPI Pt is here today for a follow up of her nausea and epigastric pain. She reports that it is better but she is still having a hard time eating. She is "grazing" and only can eat "junk". She is taking her pantoprazole daily and zantac PRN. She thinks she does need to see her surgeon.       Allergies  Allergen Reactions  . Sulfa Antibiotics Nausea And Vomiting     Current Outpatient Prescriptions:  .  aspirin 81 MG tablet, Take by mouth., Disp: , Rfl:  .  B COMPLEX VITAMINS PO, Take by mouth., Disp: , Rfl:  .  cholecalciferol (VITAMIN D) 1000 UNITS tablet, Take 1,000 Units by mouth daily., Disp: , Rfl:  .  fluticasone (FLONASE) 50 MCG/ACT nasal spray, Place 2 sprays into both nostrils daily., Disp: 16 g, Rfl: 12 .  levothyroxine (SYNTHROID, LEVOTHROID) 100 MCG tablet, TAKE 1 TABLET BY MOUTH DAILY, Disp: 30 tablet, Rfl: 12 .  loratadine (CLARITIN) 10 MG tablet, Take 1 tablet (10 mg total) by mouth daily., Disp: 30 tablet, Rfl: 11 .  Multiple Vitamin (MULTIVITAMIN) LIQD, Take 5 mLs by mouth daily., Disp: , Rfl:  .  nitrofurantoin (MACRODANTIN) 50 MG capsule, Take 1 capsule (50 mg total) by mouth daily., Disp: 30 capsule, Rfl: 5 .  ondansetron (ZOFRAN) 8 MG tablet, Take 1 tablet (8 mg total) by mouth every 8 (eight) hours as needed for nausea or vomiting., Disp: 60 tablet, Rfl: 0 .  pantoprazole (PROTONIX) 40 MG tablet, TAKE 1 TABLET (40 MG TOTAL) BY MOUTH DAILY., Disp: 30 tablet, Rfl: 3 .  ranitidine (ZANTAC) 150 MG tablet, Take 150 mg by mouth 2 (two) times daily., Disp: , Rfl:  .  tamoxifen (NOLVADEX) 20 MG tablet, TAKE 1 TABLET BY MOUTH EVERY DAY, Disp: 30 tablet, Rfl: 8 .  vitamin B-12 (CYANOCOBALAMIN) 1000 MCG tablet, Take 1,000 mcg by mouth daily., Disp: ,  Rfl:  .  metoCLOPramide (REGLAN) 10 MG tablet, Take 1 tablet (10 mg total) by mouth 4 (four) times daily -  before meals and at bedtime. (Patient not taking: Reported on 08/11/2017), Disp: 90 tablet, Rfl: 11  Review of Systems  Constitutional: Negative.   HENT: Negative.   Eyes: Negative.   Respiratory: Negative.   Cardiovascular: Negative.   Gastrointestinal: Positive for abdominal pain, nausea and vomiting.  Endocrine: Negative.   Genitourinary: Negative.   Musculoskeletal: Negative.   Skin: Negative.   Allergic/Immunologic: Negative.   Neurological: Negative.   Hematological: Negative.   Psychiatric/Behavioral: Negative.     Social History  Substance Use Topics  . Smoking status: Never Smoker  . Smokeless tobacco: Never Used  . Alcohol use No   Objective:   BP 118/68 (BP Location: Left Arm, Patient Position: Sitting, Cuff Size: Large)   Pulse 82   Temp 98.4 F (36.9 C) (Oral)   Resp 16   Wt 248 lb (112.5 kg)   LMP 11/28/1990   BMI 38.84 kg/m  Vitals:   08/11/17 1615  BP: 118/68  Pulse: 82  Resp: 16  Temp: 98.4 F (36.9 C)  TempSrc: Oral  Weight: 248 lb (112.5 kg)     Physical Exam  Constitutional: She is oriented to  person, place, and time. She appears well-developed and well-nourished.  Eyes: Pupils are equal, round, and reactive to light. Conjunctivae and EOM are normal.  Neck: Normal range of motion. Neck supple.  Cardiovascular: Normal rate, regular rhythm, normal heart sounds and intact distal pulses.   Pulmonary/Chest: Effort normal and breath sounds normal.  Abdominal: Soft. Bowel sounds are normal. There is no tenderness.  Musculoskeletal: Normal range of motion.  Neurological: She is alert and oriented to person, place, and time. She has normal reflexes.  Skin: Skin is warm and dry.  Psychiatric: She has a normal mood and affect. Her behavior is normal. Judgment and thought content normal.        Assessment & Plan:     1. Epigastric  pain Uncertain etiology in pt who has had Roux-n-Y procedure. - Ambulatory referral to General Surgery  2. Nausea  - Ambulatory referral to General Surgery  3. Gastroesophageal reflux disease without esophagitis  - Ambulatory referral to General Surgery  4. Status post gastric bypass for obesity Will get to gastric bypass surgeon.  - Ambulatory referral to General Surgery      HPI, Exam, and A&P Transcribed under the direction and in the presence of Richard L. Cranford Mon, MD  Electronically Signed: Katina Dung, CMA I have done the exam and reviewed the above chart and it is accurate to the best of my knowledge. Development worker, community has been used in this note in any air is in the dictation or transcription are unintentional.  Wilhemena Durie, MD  [place]

## 2017-08-21 ENCOUNTER — Telehealth: Payer: Self-pay | Admitting: Family Medicine

## 2017-08-21 NOTE — Telephone Encounter (Signed)
FYI--A referral to Dr Hassell Done with [facility] was made in July.They tried to contact pt several times and was unable to contact pt.I also have tried several times to contact pt and not able to reach pt until 08/20/17 because her voice mail was full.Pt was advised to contact their office to schedule appointment.As of today 08/21/17 appointment has not been made

## 2017-08-28 ENCOUNTER — Telehealth: Payer: Self-pay | Admitting: Family Medicine

## 2017-08-28 MED ORDER — PREDNISONE 10 MG PO TABS: ORAL_TABLET | ORAL | 0 refills | Status: DC

## 2017-08-28 NOTE — Telephone Encounter (Signed)
10mg --6 day taper. 

## 2017-08-28 NOTE — Telephone Encounter (Signed)
Please review-Anuoluwapo Mefferd V Jaaron Oleson, RMA  

## 2017-08-28 NOTE — Telephone Encounter (Signed)
Rx sent to pharmacy. Patient was notified.  

## 2017-08-28 NOTE — Telephone Encounter (Signed)
Pt called wanting to know if you can call in prednisone for her back.  She is having a lot of pain.  Taking the muscle relaxers at night,  Her call back is [phone]  Thanks teri

## 2017-10-02 ENCOUNTER — Ambulatory Visit (INDEPENDENT_AMBULATORY_CARE_PROVIDER_SITE_OTHER): Payer: 59 | Admitting: Family Medicine

## 2017-10-02 ENCOUNTER — Ambulatory Visit: Payer: Self-pay

## 2017-10-02 ENCOUNTER — Encounter: Payer: Self-pay | Admitting: Family Medicine

## 2017-10-02 ENCOUNTER — Ambulatory Visit
Admission: RE | Admit: 2017-10-02 | Discharge: 2017-10-02 | Disposition: A | Discharge status: Home / Self Care | Payer: 59 | Source: Ambulatory Visit | Attending: Family Medicine | Admitting: Family Medicine

## 2017-10-02 VITALS — BP 132/90 | HR 78 | Temp 98.4°F | Resp 16 | Wt 249.0 lb

## 2017-10-02 DIAGNOSIS — M461 Sacroiliitis, not elsewhere classified: Secondary | ICD-10-CM

## 2017-10-02 DIAGNOSIS — M5134 Other intervertebral disc degeneration, thoracic region: Secondary | ICD-10-CM | POA: Insufficient documentation

## 2017-10-02 DIAGNOSIS — M533 Sacrococcygeal disorders, not elsewhere classified: Secondary | ICD-10-CM | POA: Insufficient documentation

## 2017-10-02 DIAGNOSIS — M5136 Other intervertebral disc degeneration, lumbar region: Secondary | ICD-10-CM | POA: Diagnosis not present

## 2017-10-02 MED ORDER — CYCLOBENZAPRINE HCL 5 MG PO TABS: ORAL_TABLET | ORAL | 5 refills | Status: DC

## 2017-10-02 MED ORDER — PREDNISONE 20 MG PO TABS: ORAL_TABLET | ORAL | 0 refills | Status: DC

## 2017-10-02 NOTE — Patient Instructions (Signed)
We will call you with the x-ray reports.

## 2017-10-02 NOTE — Progress Notes (Signed)
Subjective:     Patient ID: Kathy Mcintosh, female   DOB: 1956-06-12, 61 y.o.   MRN: [id number]  HPI  Chief Complaint  Patient presents with  . Back Pain    Patient comes into office today with complaints of lower back pain for the past 3 weeks. Patient states that she had called office  two weeks ago and was prescribed prednisone and cyclobenzaprine for muscle ache and pain, patient states that pain has remained persistent. Patient has been taking otc Ibuprofen and Tylenol with little to no relief.   States her back hurts without radiation  after prolonged sitting at work. She will take breaks every 1.5 hours and has an ergonomic chair. Called in brief course of prednisone 9/13 with transient relief of sx for 1.5 weeks. She also uses cyclobenzaprine at night which also helps. No hx of injury or prior surgery   Review of Systems     Objective:   Physical Exam  Constitutional: She appears well-developed and well-nourished. No distress.  Musculoskeletal:  Muscle strength in lower extremities 5/5. SLR's to 90 degrees without back pain or radiation of back pain. Localizes to her left SI area.       Assessment:    1. Sacroiliitis (HCC) - cyclobenzaprine (FLEXERIL) 5 MG tablet; One pill daily for back spasms  Dispense: 30 tablet; Refill: 5 - predniSONE (DELTASONE) 20 MG tablet; Taper as follows: 3 pills for 4 days, two pills for 4 days, one pill for four days  Dispense: 24 tablet; Refill: 0 - DG Lumbar Spine Complete; Future - DG Sacrum/Coccyx; Future    Plan:    Further f/u pending x-ray report. Consider orthopedic referral.

## 2017-10-03 ENCOUNTER — Other Ambulatory Visit: Payer: Self-pay | Admitting: Family Medicine

## 2017-10-03 DIAGNOSIS — M545 Low back pain: Principal | ICD-10-CM

## 2017-10-03 DIAGNOSIS — G8929 Other chronic pain: Secondary | ICD-10-CM

## 2017-10-16 ENCOUNTER — Other Ambulatory Visit: Payer: Self-pay | Admitting: Family Medicine

## 2017-10-16 ENCOUNTER — Other Ambulatory Visit: Payer: Self-pay | Admitting: General Surgery

## 2017-10-16 DIAGNOSIS — K259 Gastric ulcer, unspecified as acute or chronic, without hemorrhage or perforation: Secondary | ICD-10-CM

## 2017-10-16 DIAGNOSIS — K219 Gastro-esophageal reflux disease without esophagitis: Secondary | ICD-10-CM

## 2017-10-23 ENCOUNTER — Other Ambulatory Visit: Payer: Self-pay | Admitting: Surgery

## 2017-10-23 DIAGNOSIS — K289 Gastrojejunal ulcer, unspecified as acute or chronic, without hemorrhage or perforation: Secondary | ICD-10-CM

## 2017-10-24 ENCOUNTER — Ambulatory Visit: Payer: 59

## 2017-10-29 ENCOUNTER — Ambulatory Visit
Admission: RE | Admit: 2017-10-29 | Discharge: 2017-10-29 | Disposition: A | Discharge status: Home / Self Care | Payer: 59 | Source: Ambulatory Visit | Attending: Surgery | Admitting: Surgery

## 2017-10-29 DIAGNOSIS — K289 Gastrojejunal ulcer, unspecified as acute or chronic, without hemorrhage or perforation: Secondary | ICD-10-CM | POA: Insufficient documentation

## 2017-10-29 DIAGNOSIS — Z9884 Bariatric surgery status: Secondary | ICD-10-CM | POA: Diagnosis present

## 2017-10-30 ENCOUNTER — Other Ambulatory Visit: Payer: Self-pay | Admitting: Orthopedic Surgery

## 2017-10-30 DIAGNOSIS — M545 Low back pain, unspecified: Secondary | ICD-10-CM

## 2017-10-30 DIAGNOSIS — M5440 Lumbago with sciatica, unspecified side: Principal | ICD-10-CM

## 2017-11-07 ENCOUNTER — Ambulatory Visit
Admission: RE | Admit: 2017-11-07 | Discharge: 2017-11-07 | Disposition: A | Discharge status: Home / Self Care | Payer: 59 | Source: Ambulatory Visit | Attending: Orthopedic Surgery | Admitting: Orthopedic Surgery

## 2017-11-07 DIAGNOSIS — M48061 Spinal stenosis, lumbar region without neurogenic claudication: Secondary | ICD-10-CM | POA: Diagnosis not present

## 2017-11-07 DIAGNOSIS — M5126 Other intervertebral disc displacement, lumbar region: Secondary | ICD-10-CM | POA: Diagnosis not present

## 2017-11-07 DIAGNOSIS — M4317 Spondylolisthesis, lumbosacral region: Secondary | ICD-10-CM | POA: Diagnosis present

## 2017-11-07 DIAGNOSIS — M5440 Lumbago with sciatica, unspecified side: Secondary | ICD-10-CM

## 2017-11-07 DIAGNOSIS — M545 Low back pain, unspecified: Secondary | ICD-10-CM

## 2017-11-14 DIAGNOSIS — M48062 Spinal stenosis, lumbar region with neurogenic claudication: Secondary | ICD-10-CM | POA: Insufficient documentation

## 2017-12-15 ENCOUNTER — Other Ambulatory Visit: Payer: Self-pay | Admitting: Family Medicine

## 2017-12-15 ENCOUNTER — Ambulatory Visit (INDEPENDENT_AMBULATORY_CARE_PROVIDER_SITE_OTHER): Payer: 59 | Admitting: Family Medicine

## 2017-12-15 ENCOUNTER — Encounter: Payer: Self-pay | Admitting: Family Medicine

## 2017-12-15 VITALS — BP 128/82 | HR 80 | Temp 98.5°F | Resp 16 | Ht 67.0 in | Wt 252.0 lb

## 2017-12-15 DIAGNOSIS — J069 Acute upper respiratory infection, unspecified: Secondary | ICD-10-CM | POA: Diagnosis not present

## 2017-12-15 NOTE — Progress Notes (Signed)
Subjective:     Patient ID: Kathy Mcintosh, female   DOB: 1956-01-05, 61 y.o.   MRN: [id number] Chief Complaint  Patient presents with  . Cough    Patient c/o cough and congestion X 2 days. She also presents with laryngitis that she feels is worsening. She has been using Robutussin with mild relief. She has also had chills and sweats.    HPI States she developed hoarseness then productive cough. Minimal sinus congestion or fever.  Review of Systems     Objective:   Physical Exam  Constitutional: She appears well-developed and well-nourished. No distress.  Ears: T.M's intact without inflammation Throat:moderate tonsillar enlargement with mild erythema without exudate Neck: no cervical adenopathy Lungs: clear     Assessment:    1. Viral upper respiratory tract infection    Plan:    Discussed use of Mucinex D for congestion, Delsym for cough, and Benadryl for postnasal drainage. Call regarding further symptoms or fever.

## 2017-12-15 NOTE — Patient Instructions (Signed)
Discussed use of Mucinex D for congestion, Delsym for cough, and Benadryl for postnasal drainage 

## 2018-02-08 ENCOUNTER — Other Ambulatory Visit: Payer: Self-pay | Admitting: Family Medicine

## 2018-02-13 ENCOUNTER — Other Ambulatory Visit: Payer: Self-pay | Admitting: Family Medicine

## 2018-02-13 DIAGNOSIS — K259 Gastric ulcer, unspecified as acute or chronic, without hemorrhage or perforation: Secondary | ICD-10-CM

## 2018-02-13 DIAGNOSIS — K219 Gastro-esophageal reflux disease without esophagitis: Secondary | ICD-10-CM

## 2018-03-02 ENCOUNTER — Encounter: Payer: Self-pay | Admitting: Family Medicine

## 2018-03-02 ENCOUNTER — Ambulatory Visit (INDEPENDENT_AMBULATORY_CARE_PROVIDER_SITE_OTHER): Payer: 59 | Admitting: Family Medicine

## 2018-03-02 VITALS — BP 124/86 | HR 85 | Temp 98.6°F | Resp 16 | Wt 237.0 lb

## 2018-03-02 DIAGNOSIS — B349 Viral infection, unspecified: Secondary | ICD-10-CM | POA: Diagnosis not present

## 2018-03-02 MED ORDER — HYDROCOD POLST-CPM POLST ER 10-8 MG/5ML PO SUER: 5.0000 mL | Freq: Two times a day (BID) | ORAL | 0 refills | Status: DC | PRN

## 2018-03-02 NOTE — Patient Instructions (Addendum)
Stop muscle relaxant. Continue an expectorant like Mucinex. Call me if you are not improving over the course of the week.

## 2018-03-02 NOTE — Progress Notes (Signed)
Subjective:     Patient ID: Kathy Mcintosh, female   DOB: 1956-06-27, 62 y.o.   MRN: [id number] Chief Complaint  Patient presents with  . Cough    Patient comes into office with complaints of cough and chest congestion for 7 days. Patient reports that she has busted her blood veseel in her eye twice do to cough. Patient reports body aches, decreased appetite and weakness in her legs. Patient has tried otc Sudafed PE, Robitussin and Mucinex D.    HPI States she had a transient scratch throat then the cough which is minimally productive. Reports she never got the sinus congestion. Review of Systems     Objective:   Physical Exam  Constitutional: She appears well-developed and well-nourished. No distress.  Ears: T.M's intact without inflammation Throat: no tonsillar enlargement or exudate Neck: no cervical adenopathy Lungs: Coarse inspiratory sounds in posterior fields.     Assessment:    1. Acute viral syndrome - chlorpheniramine-HYDROcodone (TUSSIONEX PENNKINETIC ER) 10-8 MG/5ML SUER; Take 5 mLs by mouth every 12 (twelve) hours as needed for cough.  Dispense: 60 mL; Refill: 0    Plan:    Continue an expectorant. Stop the muscle relaxant. Call if not improving over the course of the week.

## 2018-03-04 ENCOUNTER — Ambulatory Visit: Payer: 59 | Admitting: Family Medicine

## 2018-03-06 ENCOUNTER — Other Ambulatory Visit: Payer: Self-pay

## 2018-03-06 DIAGNOSIS — N6091 Unspecified benign mammary dysplasia of right breast: Secondary | ICD-10-CM

## 2018-03-10 ENCOUNTER — Telehealth: Payer: Self-pay | Admitting: Family Medicine

## 2018-03-10 ENCOUNTER — Other Ambulatory Visit: Payer: Self-pay | Admitting: Family Medicine

## 2018-03-10 MED ORDER — AMOXICILLIN-POT CLAVULANATE 875-125 MG PO TABS: 1.0000 | ORAL_TABLET | Freq: Two times a day (BID) | ORAL | 0 refills | Status: DC

## 2018-03-10 NOTE — Telephone Encounter (Signed)
I have sent in Augmentin.

## 2018-03-10 NOTE — Telephone Encounter (Signed)
Last visit 03/02/18 for Viral URI. Please advise. KW

## 2018-03-10 NOTE — Telephone Encounter (Signed)
Pt states she was in last Monday for sinus congestion.  She is worse.  Green colored, thick congestion , headache.  She wants to know if she could get an antibiotic  [facility]  Her call back is [phone]  Thanks teri

## 2018-04-06 ENCOUNTER — Other Ambulatory Visit: Payer: Self-pay | Admitting: Family Medicine

## 2018-04-06 DIAGNOSIS — M461 Sacroiliitis, not elsewhere classified: Secondary | ICD-10-CM

## 2018-04-14 ENCOUNTER — Other Ambulatory Visit: Payer: 59

## 2018-04-14 ENCOUNTER — Other Ambulatory Visit: Payer: Self-pay | Admitting: Family Medicine

## 2018-04-14 DIAGNOSIS — J301 Allergic rhinitis due to pollen: Secondary | ICD-10-CM

## 2018-04-20 ENCOUNTER — Telehealth: Payer: Self-pay | Admitting: *Deleted

## 2018-04-20 NOTE — Telephone Encounter (Signed)
Left message for patient to call the office back. She had an appointment with Dr.Byrnett on 04/21/18 but rescheduled her mammogram to 05/08/18, patient needs to be rescheduled after her mammogram.

## 2018-04-21 ENCOUNTER — Ambulatory Visit: Payer: Self-pay | Admitting: General Surgery

## 2018-05-08 ENCOUNTER — Other Ambulatory Visit: Payer: 59

## 2018-05-25 ENCOUNTER — Other Ambulatory Visit: Payer: 59

## 2018-05-27 ENCOUNTER — Encounter: Payer: Self-pay | Admitting: *Deleted

## 2018-07-27 ENCOUNTER — Other Ambulatory Visit: Payer: Self-pay | Admitting: Family Medicine

## 2018-07-27 ENCOUNTER — Telehealth: Payer: Self-pay | Admitting: Family Medicine

## 2018-07-27 NOTE — Telephone Encounter (Signed)
Pt would like a refill on sucralfate  [facility]  CB#  [phone]  Thanks Con Memos

## 2018-07-27 NOTE — Telephone Encounter (Signed)
This medication was d/c 06/10/17, please review and advise. KW

## 2018-07-27 NOTE — Telephone Encounter (Signed)
See refill request.

## 2018-07-30 MED ORDER — SUCRALFATE 1 GM/10ML PO SUSP: 1.0000 g | Freq: Three times a day (TID) | ORAL | 0 refills | Status: DC

## 2018-07-30 NOTE — Telephone Encounter (Signed)
Ok--5 rf. thx

## 2018-08-22 ENCOUNTER — Other Ambulatory Visit: Payer: Self-pay | Admitting: Family Medicine

## 2018-08-22 DIAGNOSIS — K219 Gastro-esophageal reflux disease without esophagitis: Secondary | ICD-10-CM

## 2018-08-22 DIAGNOSIS — K259 Gastric ulcer, unspecified as acute or chronic, without hemorrhage or perforation: Secondary | ICD-10-CM

## 2018-08-23 ENCOUNTER — Other Ambulatory Visit: Payer: Self-pay | Admitting: Family Medicine

## 2018-08-28 ENCOUNTER — Ambulatory Visit
Admission: RE | Admit: 2018-08-28 | Discharge: 2018-08-28 | Disposition: A | Discharge status: Home / Self Care | Payer: 59 | Source: Ambulatory Visit | Attending: Family Medicine | Admitting: Family Medicine

## 2018-08-28 ENCOUNTER — Ambulatory Visit (INDEPENDENT_AMBULATORY_CARE_PROVIDER_SITE_OTHER): Payer: 59 | Admitting: Family Medicine

## 2018-08-28 ENCOUNTER — Other Ambulatory Visit: Payer: Self-pay | Admitting: Family Medicine

## 2018-08-28 ENCOUNTER — Encounter: Payer: Self-pay | Admitting: Family Medicine

## 2018-08-28 VITALS — BP 124/74 | HR 76 | Temp 99.1°F | Resp 16 | Wt 248.6 lb

## 2018-08-28 DIAGNOSIS — S92502A Displaced unspecified fracture of left lesser toe(s), initial encounter for closed fracture: Secondary | ICD-10-CM

## 2018-08-28 DIAGNOSIS — S92532A Displaced fracture of distal phalanx of left lesser toe(s), initial encounter for closed fracture: Secondary | ICD-10-CM | POA: Insufficient documentation

## 2018-08-28 DIAGNOSIS — S97112A Crushing injury of left great toe, initial encounter: Secondary | ICD-10-CM

## 2018-08-28 DIAGNOSIS — S97122A Crushing injury of left lesser toe(s), initial encounter: Secondary | ICD-10-CM

## 2018-08-28 DIAGNOSIS — W5519XA Other contact with horse, initial encounter: Secondary | ICD-10-CM | POA: Diagnosis not present

## 2018-08-28 MED ORDER — CELECOXIB 200 MG PO CAPS: 200.0000 mg | ORAL_CAPSULE | Freq: Every day | ORAL | 1 refills | Status: DC

## 2018-08-28 NOTE — Progress Notes (Signed)
  Subjective:     Patient ID: Kathy Mcintosh, female   DOB: [birth year], 62 y.o.   MRN: [id number] Chief Complaint  Patient presents with  . 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 Patient reports bruising and bleeding on her great toe and second   HPI She has iced and elevated her toes. Reports swelling and moderate pain.  Review of Systems     Objective:   Physical Exam  Constitutional: She appears well-developed and well-nourished. No distress.  Musculoskeletal:  Mild swelling/erythema of left great and second toe. Great toe without deformity but second toe appears more angulated than the contralateral side.       Assessment:    1. Crushing injury of left great toe, initial encounter: refilled Celebrex - DG Toe Great Left; Future  2. Crushing injury of lesser toe of left foot, initial encounter - DG Toe 2nd Left; Future    Plan:    Continue elevation and icing pending x-ray report.

## 2018-08-28 NOTE — Patient Instructions (Addendum)
Continue icing and antiinflammatories. We will call you with the x-ray report.

## 2018-09-23 ENCOUNTER — Other Ambulatory Visit: Payer: Self-pay | Admitting: Family Medicine

## 2018-10-13 ENCOUNTER — Telehealth: Payer: Self-pay

## 2018-10-13 NOTE — Telephone Encounter (Signed)
Spoke with patient about rescheduling her mammogram and follow up visit with Dr Bary Castilla that was due in May. She states that she is going out of town next week but will work on it when she gets back.

## 2018-10-22 ENCOUNTER — Other Ambulatory Visit: Payer: Self-pay | Admitting: Family Medicine

## 2018-11-15 ENCOUNTER — Other Ambulatory Visit: Payer: Self-pay | Admitting: Family Medicine

## 2018-12-24 ENCOUNTER — Encounter: Payer: Self-pay | Admitting: Family Medicine

## 2018-12-24 ENCOUNTER — Ambulatory Visit (INDEPENDENT_AMBULATORY_CARE_PROVIDER_SITE_OTHER): Payer: 59 | Admitting: Family Medicine

## 2018-12-24 ENCOUNTER — Ambulatory Visit
Admission: RE | Admit: 2018-12-24 | Discharge: 2018-12-24 | Disposition: A | Discharge status: Home / Self Care | Payer: 59 | Source: Ambulatory Visit | Attending: Family Medicine | Admitting: Family Medicine

## 2018-12-24 ENCOUNTER — Ambulatory Visit
Admission: RE | Admit: 2018-12-24 | Discharge: 2018-12-24 | Disposition: A | Discharge status: Home / Self Care | Payer: 59 | Attending: Family Medicine | Admitting: Family Medicine

## 2018-12-24 VITALS — BP 126/82 | HR 74 | Temp 98.8°F | Resp 16 | Wt 241.0 lb

## 2018-12-24 DIAGNOSIS — Z23 Encounter for immunization: Secondary | ICD-10-CM | POA: Diagnosis not present

## 2018-12-24 DIAGNOSIS — E039 Hypothyroidism, unspecified: Secondary | ICD-10-CM | POA: Diagnosis not present

## 2018-12-24 DIAGNOSIS — E7800 Pure hypercholesterolemia, unspecified: Secondary | ICD-10-CM

## 2018-12-24 DIAGNOSIS — S9782XA Crushing injury of left foot, initial encounter: Secondary | ICD-10-CM

## 2018-12-24 DIAGNOSIS — M79671 Pain in right foot: Secondary | ICD-10-CM | POA: Diagnosis present

## 2018-12-24 DIAGNOSIS — Z0000 Encounter for general adult medical examination without abnormal findings: Secondary | ICD-10-CM

## 2018-12-24 DIAGNOSIS — K219 Gastro-esophageal reflux disease without esophagitis: Secondary | ICD-10-CM | POA: Diagnosis not present

## 2018-12-24 MED ORDER — SUCRALFATE 1 G PO TABS: 1.0000 g | ORAL_TABLET | Freq: Four times a day (QID) | ORAL | 11 refills | Status: DC

## 2018-12-24 NOTE — Progress Notes (Signed)
Patient: Kathy Mcintosh, Female    DOB: 06-20-1956, 63 y.o.   MRN: [id number] Visit Date: 12/24/2018  Today's Provider: Wilhemena Durie, MD   Chief Complaint  Patient presents with  . Annual Exam   Subjective:    Annual physical exam Kathy Mcintosh is a 63 y.o. female who presents today for health maintenance and complete physical. She feels well. She reports exercising not regularly. She reports she is sleeping well.  Mammogram-  03/29/2016. F/B Dr. Bary Castilla. Normal. Colonoscopy- 01/15/2017. Diverticulosis. Repeat in 10 years. Pap- F/B [facility]. Reports that she has this scheduled at the end of the month.      Review of Systems  Constitutional: Negative.   HENT: Negative.   Eyes: Negative.   Respiratory: Negative.   Cardiovascular: Negative.   Gastrointestinal: Negative.        Heart burn.  Endocrine: Negative.   Genitourinary: Negative.   Musculoskeletal: Positive for arthralgias, back pain and joint swelling.       Chronic low back pain and right foot pain.  Skin: Negative.   Allergic/Immunologic: Negative.   Neurological: Negative.   Hematological: Negative.   Psychiatric/Behavioral: Negative.     Social History      She  reports that she has never smoked. She has never used smokeless tobacco. She reports that she does not drink alcohol or use drugs.       Social History   Socioeconomic History  . Marital status: Married    Spouse name: Not on file  . Number of children: Not on file  . Years of education: Not on file  . Highest education level: Not on file  Occupational History  . Not on file  Social Needs  . Financial resource strain: Not on file  . Food insecurity:    Worry: Not on file    Inability: Not on file  . Transportation needs:    Medical: Not on file    Non-medical: Not on file  Tobacco Use  . Smoking status: Never Smoker  . Smokeless tobacco: Never Used  Substance and Sexual Activity  . Alcohol use: No  . Drug use: No  .  Sexual activity: Not on file  Lifestyle  . Physical activity:    Days per week: Not on file    Minutes per session: Not on file  . Stress: Not on file  Relationships  . Social connections:    Talks on phone: Not on file    Gets together: Not on file    Attends religious service: Not on file    Active member of club or organization: Not on file    Attends meetings of clubs or organizations: Not on file    Relationship status: Not on file  Other Topics Concern  . Not on file  Social History Narrative  . Not on file    Past Medical History:  Diagnosis Date  . Atypical ductal hyperplasia of right breast 2014  . Esophageal spasm   . Hypothyroidism      Patient Active Problem List   Diagnosis Date Noted  . Encounter for screening colonoscopy 01/06/2017  . Epigastric pain 01/06/2017  . Arthritis 10/25/2015  . Age-related memory disorder 10/25/2015  . Bladder cystocele 10/25/2015  . Acid reflux 10/25/2015  . Hypercholesteremia 10/25/2015  . BP (high blood pressure) 10/25/2015  . Cannot sleep 10/25/2015  . Headache, migraine 10/25/2015  . 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 B12 deficiency 10/25/2015  .  Avitaminosis D 10/25/2015  . Adiposity 09/21/2013  . Adult hypothyroidism 09/21/2013  . Uterovaginal prolapse 07/23/2013  . Atypical ductal hyperplasia, breast 06/22/2013  . Hypothyroidism 11/28/2012    Class: Chronic  . Vitamin D deficiency disease 11/28/2012    Past Surgical History:  Procedure Laterality Date  . ABDOMINAL HYSTERECTOMY  10/14  . BREAST BIOPSY Right 12/07/2012   stereo biopsy - positive  . BREAST LUMPECTOMY Right 01/01/2013   ADH -   . BREAST MASS EXCISION Right Jan 2014  . 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 Negative margins.l  . CHOLECYSTECTOMY  1984  . COLONOSCOPY WITH PROPOFOL N/A 01/15/2017   Procedure: COLONOSCOPY WITH PROPOFOL;  Surgeon: Robert Bellow, MD;  Location: [facility]  ENDOSCOPY;  Service: Endoscopy;  Laterality: N/A;  . ESOPHAGOGASTRODUODENOSCOPY (EGD) WITH PROPOFOL N/A 01/15/2017   Procedure: ESOPHAGOGASTRODUODENOSCOPY (EGD) WITH PROPOFOL;  Surgeon: Robert Bellow, MD;  Location: [facility] ENDOSCOPY;  Service: Endoscopy;  Laterality: N/A;  . GASTRIC BYPASS  2004  . KNEE ARTHROSCOPY  Right 2011, Left 2012  . MASS EXCISION Bilateral 11/24/2015   Procedure: LEFT RING FINGER AND RIGHT THUMB MASS EXCISION ;  Surgeon: Roseanne Kaufman, MD;  Location: [facility];  Service: Orthopedics;  Laterality: Bilateral;  . MM BREAST STEREO BX*L*R/S  Dec 2013  . pannulectomy      Family History        Family Status  Relation Name Status  . Mother  Deceased  . Sister  Alive  . Sister  Alive  . Father  Deceased at age 73  . Daughter  Alive  . Son  Alive  . Ethlyn Daniels  (Not Specified)        Her family history includes Asthma in her daughter and mother; Breast cancer in her sister; Breast cancer (age of onset: 28) in her paternal aunt; COPD in her father and mother; Cervical cancer in her mother and sister; Dementia in her mother; Heart disease in her father; Heart failure in her father; Hypertension in her mother; Lung cancer in her mother.      Allergies  Allergen Reactions  . Sulfa Antibiotics Nausea And Vomiting     Current Outpatient Medications:  .  B COMPLEX VITAMINS PO, Take by mouth., Disp: , Rfl:  .  celecoxib (CELEBREX) 200 MG capsule, TAKE 1 CAPSULE BY MOUTH EVERY DAY, Disp: 30 capsule, Rfl: 1 .  cholecalciferol (VITAMIN D) 1000 UNITS tablet, Take 1,000 Units by mouth daily., Disp: , Rfl:  .  cyclobenzaprine (FLEXERIL) 5 MG tablet, ONE PILL DAILY FOR BACK SPASMS, Disp: 30 tablet, Rfl: 5 .  gabapentin (NEURONTIN) 100 MG capsule, Take 100 mg by mouth 2 (two) times daily., Disp: , Rfl:  .  levothyroxine (SYNTHROID, LEVOTHROID) 100 MCG tablet, TAKE 1 TABLET BY MOUTH EVERY DAY, Disp: 30 tablet, Rfl: 5 .  loratadine (CLARITIN) 10 MG tablet, TAKE 1  TABLET (10 MG TOTAL) BY MOUTH DAILY., Disp: 30 tablet, Rfl: 11 .  Multiple Vitamin (MULTIVITAMIN) LIQD, Take 5 mLs by mouth daily., Disp: , Rfl:  .  nitrofurantoin (MACRODANTIN) 50 MG capsule, TAKE 1 CAPSULE (50 MG TOTAL) BY MOUTH DAILY., Disp: 30 capsule, Rfl: 2 .  ondansetron (ZOFRAN) 8 MG tablet, Take 1 tablet (8 mg total) by mouth every 8 (eight) hours as needed for nausea or vomiting., Disp: 60 tablet, Rfl: 0 .  pantoprazole (PROTONIX) 40 MG tablet, TAKE 1 TABLET (40 MG TOTAL) BY MOUTH DAILY.,  Disp: 90 tablet, Rfl: 3 .  ranitidine (ZANTAC) 150 MG tablet, Take 150 mg by mouth 2 (two) times daily., Disp: , Rfl:  .  sucralfate (CARAFATE) 1 GM/10ML suspension, Take 10 mLs (1 g total) by mouth 4 (four) times daily -  with meals and at bedtime., Disp: 420 mL, Rfl: 0 .  vitamin B-12 (CYANOCOBALAMIN) 1000 MCG tablet, Take 1,000 mcg by mouth daily., Disp: , Rfl:  .  chlorpheniramine-HYDROcodone (TUSSIONEX PENNKINETIC ER) 10-8 MG/5ML SUER, Take 5 mLs by mouth every 12 (twelve) hours as needed for cough. (Patient not taking: Reported on 12/24/2018), Disp: 60 mL, Rfl: 0   Patient Care Team: Jerrol Banana., MD as PCP - General (Family Medicine) Bary Castilla, Forest Gleason, MD (General Surgery)      Objective:   Vitals: BP 126/82 (BP Location: Left Arm, Patient Position: Sitting, Cuff Size: Large)   Pulse 74   Temp 98.8 F (37.1 C)   Resp 16   Wt 241 lb (109.3 kg)   LMP 11/28/1990   SpO2 98%   BMI 37.75 kg/m    Vitals:   12/24/18 0913  BP: 126/82  Pulse: 74  Resp: 16  Temp: 98.8 F (37.1 C)  SpO2: 98%  Weight: 241 lb (109.3 kg)     Physical Exam Constitutional:      Appearance: She is well-developed.  HENT:     Head: Normocephalic and atraumatic.     Right Ear: External ear normal.     Left Ear: External ear normal.     Nose: Nose normal.  Eyes:     General: No scleral icterus.    Conjunctiva/sclera: Conjunctivae normal.     Pupils: Pupils are equal, round, and reactive to  light.  Neck:     Musculoskeletal: Normal range of motion and neck supple.  Cardiovascular:     Rate and Rhythm: Normal rate and regular rhythm.     Heart sounds: Normal heart sounds.  Pulmonary:     Effort: Pulmonary effort is normal.     Breath sounds: Normal breath sounds.  Abdominal:     General: Bowel sounds are normal.     Palpations: Abdomen is soft.     Tenderness: There is no abdominal tenderness.  Musculoskeletal: Normal range of motion.     Comments: Mild tenderness over right 4th and 5th metatarsal pad.  Skin:    General: Skin is warm and dry.  Neurological:     General: No focal deficit present.     Mental Status: She is alert and oriented to person, place, and time. Mental status is at baseline.     Deep Tendon Reflexes: Reflexes are normal and symmetric.  Psychiatric:        Mood and Affect: Mood normal.        Behavior: Behavior normal.        Thought Content: Thought content normal.        Judgment: Judgment normal.      Depression Screen PHQ 2/9 Scores 12/24/2018 04/10/2017  PHQ - 2 Score 0 0  PHQ- 9 Score - 0      Assessment & Plan:     Routine Health Maintenance and Physical Exam  Exercise Activities and Dietary recommendations Goals   None     Immunization History  Administered Date(s) Administered  . Td 09/25/2004  . Tdap 04/10/2017    Health Maintenance  Topic Date Due  . HIV Screening  02/15/1970  . PAP SMEAR-Modifier  08/03/2011  . INFLUENZA  VACCINE  07/16/2018  . MAMMOGRAM  04/11/2019  . COLONOSCOPY  01/15/2027  . TETANUS/TDAP  04/11/2027  . Hepatitis C Screening  Completed    Gyn per Gynecology. Discussed health benefits of physical activity, and encouraged her to engage in regular exercise appropriate for her age and condition.  1. Annual physical exam Breast and pelvic exam per GYN.  2. Crushing injury of left foot, initial encounter  - DG Foot Complete Left; Future - Ambulatory referral to Podiatry  3. Foot pain,  right To podiatry. - DG Foot Complete Right; Future - Ambulatory referral to Podiatry  4. Gastroesophageal reflux disease without esophagitis May need GI follow-up. - sucralfate (CARAFATE) 1 g tablet; Take 1 tablet (1 g total) by mouth 4 (four) times daily.  Dispense: 120 tablet; Refill: 11  5. Needs flu shot  6.Morbid Obesity - Flu Vaccine QUAD 6+ mos PF IM (Fluarix Quad PF)  7. Adult hypothyroidism  - CBC with Differential/Platelet - TSH  8. Hypercholesteremia  - Comprehensive metabolic panel - Lipid panel    I have done the exam and reviewed the above chart and it is accurate to the best of my knowledge. Development worker, community has been used in this note in any air is in the dictation or transcription are unintentional.  Wilhemena Durie, MD  [place]

## 2018-12-25 LAB — COMPREHENSIVE METABOLIC PANEL
A/G RATIO: 1.6 (ref 1.2–2.2)
ALK PHOS: 171 IU/L — AB (ref 39–117)
ALT: 10 IU/L (ref 0–32)
AST: 15 IU/L (ref 0–40)
Albumin: 4.1 g/dL (ref 3.6–4.8)
BILIRUBIN TOTAL: 0.3 mg/dL (ref 0.0–1.2)
BUN/Creatinine Ratio: 16 (ref 12–28)
BUN: 10 mg/dL (ref 8–27)
CHLORIDE: 105 mmol/L (ref 96–106)
CO2: 22 mmol/L (ref 20–29)
Calcium: 9.6 mg/dL (ref 8.7–10.3)
Creatinine, Ser: 0.64 mg/dL (ref 0.57–1.00)
GFR calc Af Amer: 110 mL/min/{1.73_m2} (ref >59)
GFR calc non Af Amer: 95 mL/min/{1.73_m2} (ref >59)
GLUCOSE: 95 mg/dL (ref 65–99)
Globulin, Total: 2.6 g/dL (ref 1.5–4.5)
Potassium: 5.2 mmol/L (ref 3.5–5.2)
Sodium: 143 mmol/L (ref 134–144)
TOTAL PROTEIN: 6.7 g/dL (ref 6.0–8.5)

## 2018-12-25 LAB — CBC WITH DIFFERENTIAL/PLATELET
BASOS ABS: 0.1 10*3/uL (ref 0.0–0.2)
BASOS: 1 %
EOS (ABSOLUTE): 0.1 10*3/uL (ref 0.0–0.4)
Eos: 2 %
Hematocrit: 34.4 % (ref 34.0–46.6)
Hemoglobin: 10.9 g/dL — ABNORMAL LOW (ref 11.1–15.9)
IMMATURE GRANS (ABS): 0 10*3/uL (ref 0.0–0.1)
Immature Granulocytes: 0 %
LYMPHS: 24 %
Lymphocytes Absolute: 2.2 10*3/uL (ref 0.7–3.1)
MCH: 24.9 pg — ABNORMAL LOW (ref 26.6–33.0)
MCHC: 31.7 g/dL (ref 31.5–35.7)
MCV: 79 fL (ref 79–97)
MONOS ABS: 0.7 10*3/uL (ref 0.1–0.9)
Monocytes: 8 %
Neutrophils Absolute: 6.1 10*3/uL (ref 1.4–7.0)
Neutrophils: 65 %
PLATELETS: 322 10*3/uL (ref 150–450)
RBC: 4.37 x10E6/uL (ref 3.77–5.28)
RDW: 15.5 % — AB (ref 11.7–15.4)
WBC: 9.2 10*3/uL (ref 3.4–10.8)

## 2018-12-25 LAB — LIPID PANEL
CHOLESTEROL TOTAL: 169 mg/dL (ref 100–199)
Chol/HDL Ratio: 3.3 ratio (ref 0.0–4.4)
HDL: 52 mg/dL (ref >39)
LDL Calculated: 88 mg/dL (ref 0–99)
TRIGLYCERIDES: 145 mg/dL (ref 0–149)
VLDL Cholesterol Cal: 29 mg/dL (ref 5–40)

## 2018-12-25 LAB — TSH: TSH: 3.2 u[IU]/mL (ref 0.450–4.500)

## 2018-12-28 ENCOUNTER — Telehealth: Payer: Self-pay

## 2018-12-28 NOTE — Telephone Encounter (Signed)
FYI

## 2018-12-28 NOTE — Telephone Encounter (Signed)
Patient reports she has an appt with Dr. Cleda Mccreedy at [facility] today at 2:30 pm. Patient wants to make sure that DR. Rosanna Randy releases her x-ray result. sd

## 2018-12-28 NOTE — Telephone Encounter (Signed)
-----   Message from Jerrol Banana., MD sent at 12/28/2018 11:08 AM EST ----- Arthritis of this foot

## 2018-12-28 NOTE — Telephone Encounter (Signed)
-----   Message from Jerrol Banana., MD sent at 12/28/2018 11:08 AM EST ----- Nondisplaced fracture and arthritis noted.  Follow-up with podiatry as patient has appointment

## 2018-12-28 NOTE — Telephone Encounter (Signed)
Tried calling patient, no answer. VMailbox full. Will try again later.

## 2018-12-29 ENCOUNTER — Telehealth: Payer: Self-pay

## 2018-12-29 MED ORDER — GABAPENTIN 100 MG PO CAPS: 100.0000 mg | ORAL_CAPSULE | Freq: Two times a day (BID) | ORAL | 12 refills | Status: DC

## 2018-12-29 NOTE — Telephone Encounter (Signed)
-----   Message from Jerrol Banana., MD sent at 12/29/2018  8:10 AM EST ----- Labs stable.  Borderline anemic.  Repeat CBC on next visit.

## 2018-12-29 NOTE — Telephone Encounter (Signed)
Sent medication into the pharmacy.

## 2018-12-29 NOTE — Telephone Encounter (Signed)
1 year refils.

## 2018-12-29 NOTE — Telephone Encounter (Signed)
Patient was advised. Patient would like to know if you could please send in her Gabapentin 100 mg capsule to the pharmacy for her.

## 2018-12-30 NOTE — Telephone Encounter (Signed)
Advised patient of results.  

## 2019-01-19 ENCOUNTER — Other Ambulatory Visit: Payer: Self-pay | Admitting: Family Medicine

## 2019-01-19 DIAGNOSIS — J301 Allergic rhinitis due to pollen: Secondary | ICD-10-CM

## 2019-02-17 ENCOUNTER — Other Ambulatory Visit: Payer: Self-pay | Admitting: Family Medicine

## 2019-03-25 ENCOUNTER — Ambulatory Visit (INDEPENDENT_AMBULATORY_CARE_PROVIDER_SITE_OTHER): Payer: 59 | Admitting: Family Medicine

## 2019-03-25 ENCOUNTER — Other Ambulatory Visit: Payer: Self-pay

## 2019-03-25 ENCOUNTER — Encounter: Payer: Self-pay | Admitting: Family Medicine

## 2019-03-25 ENCOUNTER — Ambulatory Visit: Payer: 59 | Admitting: Family Medicine

## 2019-03-25 VITALS — BP 154/86 | HR 83 | Temp 98.1°F | Ht 67.0 in | Wt 248.2 lb

## 2019-03-25 DIAGNOSIS — E039 Hypothyroidism, unspecified: Secondary | ICD-10-CM

## 2019-03-25 DIAGNOSIS — N3090 Cystitis, unspecified without hematuria: Secondary | ICD-10-CM | POA: Diagnosis not present

## 2019-03-25 DIAGNOSIS — N3281 Overactive bladder: Secondary | ICD-10-CM | POA: Diagnosis not present

## 2019-03-25 DIAGNOSIS — E6609 Other obesity due to excess calories: Secondary | ICD-10-CM

## 2019-03-25 DIAGNOSIS — Z6838 Body mass index (BMI) 38.0-38.9, adult: Secondary | ICD-10-CM

## 2019-03-25 MED ORDER — CIPROFLOXACIN HCL 500 MG PO TABS: ORAL_TABLET | ORAL | 1 refills | Status: DC

## 2019-03-25 MED ORDER — OXYBUTYNIN CHLORIDE ER 10 MG PO TB24: 10.0000 mg | ORAL_TABLET | Freq: Every day | ORAL | 11 refills | Status: DC

## 2019-03-25 NOTE — Progress Notes (Signed)
Patient: Kathy Mcintosh Female    DOB: [birth year]   63 y.o.   MRN: [id number] Visit Date: 03/25/2019  Today's Provider: Wilhemena Durie, MD   Chief Complaint  Patient presents with  . Urinary Tract Infection    possible.  lower back pain since monday 03/22/19.  not able to urinate much   Subjective:     HPI 6 years ago patient had surgery by Dr. Sharlett Iles at [facility] for cystocele.  She has had recurrent UTI in urine symptoms since then but easily has had urgency, enuresis, dysuria.  No fever chills or flank pain. Allergies  Allergen Reactions  . Sulfa Antibiotics Nausea And Vomiting     Current Outpatient Medications:  .  B COMPLEX VITAMINS PO, Take by mouth., Disp: , Rfl:  .  cholecalciferol (VITAMIN D) 1000 UNITS tablet, Take 1,000 Units by mouth daily., Disp: , Rfl:  .  cyclobenzaprine (FLEXERIL) 5 MG tablet, ONE PILL DAILY FOR BACK SPASMS, Disp: 30 tablet, Rfl: 5 .  fluticasone (FLONASE) 50 MCG/ACT nasal spray, PLACE 2 SPRAYS INTO BOTH NOSTRILS DAILY., Disp: 16 g, Rfl: 12 .  gabapentin (NEURONTIN) 100 MG capsule, Take 1 capsule (100 mg total) by mouth 2 (two) times daily., Disp: 60 capsule, Rfl: 12 .  levothyroxine (SYNTHROID, LEVOTHROID) 100 MCG tablet, TAKE 1 TABLET BY MOUTH EVERY DAY, Disp: 30 tablet, Rfl: 5 .  loratadine (CLARITIN) 10 MG tablet, TAKE 1 TABLET (10 MG TOTAL) BY MOUTH DAILY., Disp: 30 tablet, Rfl: 11 .  Multiple Vitamin (MULTIVITAMIN) LIQD, Take 5 mLs by mouth daily., Disp: , Rfl:  .  nitrofurantoin (MACRODANTIN) 50 MG capsule, TAKE 1 CAPSULE (50 MG TOTAL) BY MOUTH DAILY., Disp: 30 capsule, Rfl: 2 .  ondansetron (ZOFRAN) 8 MG tablet, Take 1 tablet (8 mg total) by mouth every 8 (eight) hours as needed for nausea or vomiting., Disp: 60 tablet, Rfl: 0 .  pantoprazole (PROTONIX) 40 MG tablet, TAKE 1 TABLET (40 MG TOTAL) BY MOUTH DAILY., Disp: 90 tablet, Rfl: 3 .  sucralfate (CARAFATE) 1 g tablet, Take 1 tablet (1 g total) by mouth 4 (four) times daily., Disp:  120 tablet, Rfl: 11 .  vitamin B-12 (CYANOCOBALAMIN) 1000 MCG tablet, Take 1,000 mcg by mouth daily., Disp: , Rfl:  .  celecoxib (CELEBREX) 200 MG capsule, TAKE 1 CAPSULE BY MOUTH EVERY DAY (Patient not taking: Reported on 03/25/2019), Disp: 30 capsule, Rfl: 1 .  chlorpheniramine-HYDROcodone (TUSSIONEX PENNKINETIC ER) 10-8 MG/5ML SUER, Take 5 mLs by mouth every 12 (twelve) hours as needed for cough. (Patient not taking: Reported on 12/24/2018), Disp: 60 mL, Rfl: 0 .  ranitidine (ZANTAC) 150 MG tablet, Take 150 mg by mouth 2 (two) times daily., Disp: , Rfl:  .  sucralfate (CARAFATE) 1 GM/10ML suspension, Take 10 mLs (1 g total) by mouth 4 (four) times daily -  with meals and at bedtime. (Patient not taking: Reported on 03/25/2019), Disp: 420 mL, Rfl: 0  Review of Systems  Constitutional: Negative.   HENT: Negative.   Eyes: Negative.   Respiratory: Negative.   Cardiovascular: Negative.   Gastrointestinal: Negative.   Endocrine: Negative.   Genitourinary: Positive for difficulty urinating, frequency and urgency.  Musculoskeletal: Positive for back pain (lower).  Skin: Negative.   Allergic/Immunologic: Negative.   Neurological: Negative.   Hematological: Negative.   Psychiatric/Behavioral: Negative.     Social History   Tobacco Use  . Smoking status: Never Smoker  . Smokeless tobacco: Never Used  Substance Use  Topics  . Alcohol use: No      Objective:   BP (!) 154/86 (BP Location: Left Arm, Patient Position: Sitting, Cuff Size: Large)   Pulse 83   Temp 98.1 F (36.7 C) (Oral)   Ht 5\' 7"  (1.702 m)   Wt 248 lb 3.2 oz (112.6 kg)   LMP 11/28/1990   SpO2 99%   BMI 38.87 kg/m  Vitals:   03/25/19 1348  BP: (!) 154/86  Pulse: 83  Temp: 98.1 F (36.7 C)  TempSrc: Oral  SpO2: 99%  Weight: 248 lb 3.2 oz (112.6 kg)  Height: 5\' 7"  (1.702 m)     Physical Exam Vitals signs reviewed.  Constitutional:      Appearance: She is well-developed.  HENT:     Head: Normocephalic and  atraumatic.     Right Ear: External ear normal.     Left Ear: External ear normal.     Nose: Nose normal.  Eyes:     General: No scleral icterus.    Conjunctiva/sclera: Conjunctivae normal.     Pupils: Pupils are equal, round, and reactive to light.  Neck:     Musculoskeletal: Normal range of motion and neck supple.  Cardiovascular:     Rate and Rhythm: Normal rate and regular rhythm.     Heart sounds: Normal heart sounds.  Pulmonary:     Effort: Pulmonary effort is normal.     Breath sounds: Normal breath sounds.  Abdominal:     General: Bowel sounds are normal.     Palpations: Abdomen is soft.     Tenderness: There is no abdominal tenderness.     Comments: No CVA tenderness  Musculoskeletal: Normal range of motion.     Comments: Mild tenderness over right 4th and 5th metatarsal pad.  Skin:    General: Skin is warm and dry.  Neurological:     General: No focal deficit present.     Mental Status: She is alert and oriented to person, place, and time. Mental status is at baseline.     Deep Tendon Reflexes: Reflexes are normal and symmetric.  Psychiatric:        Mood and Affect: Mood normal.        Behavior: Behavior normal.        Thought Content: Thought content normal.        Judgment: Judgment normal.         Assessment & Plan    1. Cystitis UA is positive for leukocytes nitrites and blood - ciprofloxacin (CIPRO) 500 MG tablet; Take 1 tablet by mouth twice a day for 5 days  Dispense: 10 tablet; Refill: 1 - Urine Culture  2. OAB (overactive bladder) Try Ditropan XL nightly.  3. Acquired hypothyroidism   4. Class 2 obesity due to excess calories without serious comorbidity with body mass index (BMI) of 38.0 to 38.9 in adult     I have done the exam and reviewed the above chart and it is accurate to the best of my knowledge. Development worker, community has been used in this note in any air is in the dictation or transcription are unintentional.  Wilhemena Durie, MD   [place]

## 2019-03-27 LAB — URINE CULTURE

## 2019-03-29 ENCOUNTER — Telehealth: Payer: Self-pay

## 2019-03-29 NOTE — Telephone Encounter (Signed)
-----   Message from Jerrol Banana., MD sent at 03/29/2019  8:03 AM EDT ----- UTI should be covered with Cipro that was prescribed.

## 2019-03-29 NOTE — Telephone Encounter (Signed)
Patient notified of results. She is still taking the antibiotic

## 2019-04-12 ENCOUNTER — Other Ambulatory Visit: Payer: Self-pay | Admitting: Family Medicine

## 2019-04-16 ENCOUNTER — Ambulatory Visit (INDEPENDENT_AMBULATORY_CARE_PROVIDER_SITE_OTHER): Payer: 59 | Admitting: Family Medicine

## 2019-04-16 ENCOUNTER — Encounter: Payer: Self-pay | Admitting: Family Medicine

## 2019-04-16 ENCOUNTER — Other Ambulatory Visit: Payer: Self-pay

## 2019-04-16 VITALS — BP 132/80 | HR 76 | Resp 16

## 2019-04-16 DIAGNOSIS — N3090 Cystitis, unspecified without hematuria: Secondary | ICD-10-CM | POA: Diagnosis not present

## 2019-04-16 LAB — POCT UA - MICROSCOPIC ONLY
Casts, Ur, LPF, POC: NEGATIVE
Crystals, Ur, HPF, POC: NEGATIVE
Mucus, UA: NEGATIVE
Yeast, UA: NEGATIVE

## 2019-04-16 LAB — POCT URINALYSIS DIPSTICK
Bilirubin, UA: NEGATIVE
Glucose, UA: NEGATIVE
Ketones, UA: NEGATIVE
Nitrite, UA: NEGATIVE
Protein, UA: NEGATIVE
Spec Grav, UA: 1.02 (ref 1.010–1.025)
Urobilinogen, UA: 0.2 E.U./dL
pH, UA: 6.5 (ref 5.0–8.0)

## 2019-04-16 NOTE — Progress Notes (Signed)
Patient: Kathy Mcintosh Female    DOB: 11-07-1956   63 y.o.   MRN: [id number] Visit Date: 04/16/2019  Today's Provider: Vernie Murders, PA   Chief Complaint  Patient presents with  . Urinary Tract Infection    possibly   Subjective:     Urinary Tract Infection   This is a new problem. The current episode started yesterday. The problem occurs every urination. The problem has been gradually worsening. The quality of the pain is described as aching and burning. There has been no fever. Associated symptoms include frequency and urgency. Pertinent negatives include no hematuria or nausea. She has tried increased fluids for the symptoms.   Past Medical History:  Diagnosis Date  . Atypical ductal hyperplasia of right breast 2014  . Esophageal spasm   . Hypothyroidism    Past Surgical History:  Procedure Laterality Date  . ABDOMINAL HYSTERECTOMY  10/14  . BREAST BIOPSY Right 12/07/2012   stereo biopsy - positive  . BREAST LUMPECTOMY Right 01/01/2013   ADH -   . BREAST MASS EXCISION Right Jan 2014  . 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 Negative margins.l  . CHOLECYSTECTOMY  1984  . COLONOSCOPY WITH PROPOFOL N/A 01/15/2017   Procedure: COLONOSCOPY WITH PROPOFOL;  Surgeon: Robert Bellow, MD;  Location: [facility] ENDOSCOPY;  Service: Endoscopy;  Laterality: N/A;  . ESOPHAGOGASTRODUODENOSCOPY (EGD) WITH PROPOFOL N/A 01/15/2017   Procedure: ESOPHAGOGASTRODUODENOSCOPY (EGD) WITH PROPOFOL;  Surgeon: Robert Bellow, MD;  Location: [facility] ENDOSCOPY;  Service: Endoscopy;  Laterality: N/A;  . GASTRIC BYPASS  2004  . KNEE ARTHROSCOPY  Right 2011, Left 2012  . MASS EXCISION Bilateral 11/24/2015   Procedure: LEFT RING FINGER AND RIGHT THUMB MASS EXCISION ;  Surgeon: Roseanne Kaufman, MD;  Location: [place];  Service: Orthopedics;  Laterality: Bilateral;  . MM BREAST STEREO BX*L*R/S  Dec 2013  . pannulectomy     Family  History  Problem Relation Age of Onset  . Lung cancer Mother   . Cervical cancer Mother   . Hypertension Mother   . Asthma Mother   . COPD Mother   . Dementia Mother   . Cervical cancer Sister   . Breast cancer Sister   . Heart failure Father   . Heart disease Father   . COPD Father   . Asthma Daughter   . Breast cancer Paternal Aunt 64   Allergies  Allergen Reactions  . Sulfa Antibiotics Nausea And Vomiting    Current Outpatient Medications:  .  B COMPLEX VITAMINS PO, Take by mouth., Disp: , Rfl:  .  cholecalciferol (VITAMIN D) 1000 UNITS tablet, Take 1,000 Units by mouth daily., Disp: , Rfl:  .  cyclobenzaprine (FLEXERIL) 5 MG tablet, ONE PILL DAILY FOR BACK SPASMS, Disp: 30 tablet, Rfl: 5 .  fluticasone (FLONASE) 50 MCG/ACT nasal spray, PLACE 2 SPRAYS INTO BOTH NOSTRILS DAILY., Disp: 16 g, Rfl: 12 .  gabapentin (NEURONTIN) 100 MG capsule, Take 1 capsule (100 mg total) by mouth 2 (two) times daily., Disp: 60 capsule, Rfl: 12 .  levothyroxine (SYNTHROID) 100 MCG tablet, TAKE 1 TABLET BY MOUTH EVERY DAY, Disp: 30 tablet, Rfl: 5 .  loratadine (CLARITIN) 10 MG tablet, TAKE 1 TABLET (10 MG TOTAL) BY MOUTH DAILY., Disp: 30 tablet, Rfl: 11 .  Multiple Vitamin (MULTIVITAMIN) LIQD, Take 5 mLs by mouth daily., Disp: , Rfl:  .  ondansetron (ZOFRAN) 8 MG tablet,  Take 1 tablet (8 mg total) by mouth every 8 (eight) hours as needed for nausea or vomiting., Disp: 60 tablet, Rfl: 0 .  oxybutynin (DITROPAN XL) 10 MG 24 hr tablet, Take 1 tablet (10 mg total) by mouth at bedtime., Disp: 30 tablet, Rfl: 11 .  pantoprazole (PROTONIX) 40 MG tablet, TAKE 1 TABLET (40 MG TOTAL) BY MOUTH DAILY., Disp: 90 tablet, Rfl: 3 .  ranitidine (ZANTAC) 150 MG tablet, Take 150 mg by mouth 2 (two) times daily., Disp: , Rfl:  .  sucralfate (CARAFATE) 1 g tablet, Take 1 tablet (1 g total) by mouth 4 (four) times daily., Disp: 120 tablet, Rfl: 11 .  vitamin B-12 (CYANOCOBALAMIN) 1000 MCG tablet, Take 1,000 mcg by mouth  daily., Disp: , Rfl:  .  celecoxib (CELEBREX) 200 MG capsule, TAKE 1 CAPSULE BY MOUTH EVERY DAY (Patient not taking: Reported on 03/25/2019), Disp: 30 capsule, Rfl: 1 .  chlorpheniramine-HYDROcodone (TUSSIONEX PENNKINETIC ER) 10-8 MG/5ML SUER, Take 5 mLs by mouth every 12 (twelve) hours as needed for cough. (Patient not taking: Reported on 12/24/2018), Disp: 60 mL, Rfl: 0 .  ciprofloxacin (CIPRO) 500 MG tablet, Take 1 tablet by mouth twice a day for 5 days (Patient not taking: Reported on 04/16/2019), Disp: 10 tablet, Rfl: 1 .  nitrofurantoin (MACRODANTIN) 50 MG capsule, TAKE 1 CAPSULE (50 MG TOTAL) BY MOUTH DAILY. (Patient not taking: Reported on 04/16/2019), Disp: 30 capsule, Rfl: 2 .  sucralfate (CARAFATE) 1 GM/10ML suspension, Take 10 mLs (1 g total) by mouth 4 (four) times daily -  with meals and at bedtime. (Patient not taking: Reported on 03/25/2019), Disp: 420 mL, Rfl: 0  Review of Systems  Constitutional: Negative for activity change and fatigue.  Gastrointestinal: Negative for abdominal distention, abdominal pain and nausea.  Genitourinary: Positive for frequency and urgency. Negative for hematuria.  Neurological: Negative for dizziness and headaches.   Social History   Tobacco Use  . Smoking status: Never Smoker  . Smokeless tobacco: Never Used  Substance Use Topics  . Alcohol use: No     Objective:   BP 132/80 (BP Location: Right Arm, Patient Position: Sitting, Cuff Size: Large)   Pulse 76   Resp 16   LMP 11/28/1990   SpO2 98%  Vitals:   04/16/19 1435  BP: 132/80  Pulse: 76  Resp: 16  SpO2: 98%   Physical Exam Constitutional:      General: She is not in acute distress.    Appearance: She is well-developed.  HENT:     Head: Normocephalic and atraumatic.     Right Ear: Hearing normal.     Left Ear: Hearing normal.     Nose: Nose normal.  Eyes:     General: Lids are normal. No scleral icterus.       Right eye: No discharge.        Left eye: No discharge.      Conjunctiva/sclera: Conjunctivae normal.  Pulmonary:     Effort: Pulmonary effort is normal. No respiratory distress.  Abdominal:     General: Bowel sounds are normal.     Palpations: Abdomen is soft.     Tenderness: There is abdominal tenderness. There is no right CVA tenderness or left CVA tenderness.     Comments: Suprapubic tenderness to palpation. No masses.  Musculoskeletal: Normal range of motion.  Skin:    Findings: No lesion or rash.  Neurological:     Mental Status: She is alert and oriented to person, place, and time.  Psychiatric:        Speech: Speech normal.        Behavior: Behavior normal.        Thought Content: Thought content normal.       Assessment & Plan    1. Cystitis Developed bladder pressure, ache in back in inner thighs with burning over the past 24 hours. No fever or gross hematuria. Urinalysis showed several epithelial cells and WBC's per hpf with concentrated pH and trace of blood on dipstick. Has had recurrent UTI's and was placed on Nitrofurantoin for prophylaxis in the past. Treated by Dr. Rosanna Randy for UTI on 03-25-19 with Cipro. Will start urine C&S and may use AZO-Standard for discomfort. If no infection identified, may need urology referral for possible interstitial cystitis with history of stress incontinence. - POCT urinalysis dipstick - Urine Culture - POCT UA - Microscopic Only     Vernie Murders, PA  [facility]

## 2019-04-18 LAB — URINE CULTURE

## 2019-04-19 ENCOUNTER — Telehealth: Payer: Self-pay

## 2019-04-19 DIAGNOSIS — N3090 Cystitis, unspecified without hematuria: Secondary | ICD-10-CM

## 2019-04-19 NOTE — Telephone Encounter (Signed)
Mailbox is full and unable to leave message.

## 2019-04-19 NOTE — Telephone Encounter (Signed)
-----   Message from Margo Common, [place] sent at 04/19/2019  8:21 AM EDT ----- Some mixed bacterial growth that is not indicative of infection. If bladder discomfort continues despite use of Pyridium, recommend another round of Cipro 500 mg BID #14 and recheck specimen in a week. May need referral to an urologist if urinary symptoms persist without an identifiable pathogen.

## 2019-04-22 MED ORDER — CIPROFLOXACIN HCL 500 MG PO TABS: 500.0000 mg | ORAL_TABLET | Freq: Two times a day (BID) | ORAL | 0 refills | Status: DC

## 2019-04-22 NOTE — Telephone Encounter (Signed)
Continue the Pyridium and finish the Cipro. Schedule recheck urinalysis in 7-10 days.

## 2019-04-22 NOTE — Telephone Encounter (Signed)
Patient advised. She states the Pyridium is helping, but her urine is still cloudy. She already has a urogynecology that she has established care with. She has not been able to get in with them, because they are not seeing any patients right now. Patient would like to try the Cipro and have her urine rechecked in 1 week. Does patient need to schedule an office visit for follow up and recheck or urine, or does she just need to stop by the office next week to give a urine specimen? Please advise.

## 2019-04-22 NOTE — Telephone Encounter (Signed)
Patient advised. Follow up appointment scheduled 04/30/2019 at 1:20pm.

## 2019-04-22 NOTE — Addendum Note (Signed)
Addended by: Randal Buba on: 04/22/2019 09:49 AM   Modules accepted: Orders

## 2019-04-30 ENCOUNTER — Encounter: Payer: Self-pay | Admitting: Family Medicine

## 2019-04-30 ENCOUNTER — Ambulatory Visit (INDEPENDENT_AMBULATORY_CARE_PROVIDER_SITE_OTHER): Payer: 59 | Admitting: Family Medicine

## 2019-04-30 ENCOUNTER — Other Ambulatory Visit: Payer: Self-pay

## 2019-04-30 VITALS — BP 144/80 | HR 73 | Temp 98.3°F | Ht 66.0 in | Wt 253.2 lb

## 2019-04-30 DIAGNOSIS — N3941 Urge incontinence: Secondary | ICD-10-CM

## 2019-04-30 DIAGNOSIS — N3090 Cystitis, unspecified without hematuria: Secondary | ICD-10-CM

## 2019-04-30 LAB — POCT URINALYSIS DIPSTICK
Bilirubin, UA: NEGATIVE
Blood, UA: NEGATIVE
Glucose, UA: NEGATIVE
Ketones, UA: NEGATIVE
Leukocytes, UA: NEGATIVE
Nitrite, UA: NEGATIVE
Protein, UA: NEGATIVE
Spec Grav, UA: 1.015 (ref 1.010–1.025)
Urobilinogen, UA: 0.2 E.U./dL
pH, UA: 6 (ref 5.0–8.0)

## 2019-04-30 NOTE — Progress Notes (Signed)
Patient: Kathy Mcintosh Female    DOB: 1956/08/22   63 y.o.   MRN: [id number] Visit Date: 04/30/2019  Today's Provider: Vernie Murders, PA   Chief Complaint  Patient presents with  . Urinary Tract Infection    2 week fup   Subjective:     HPI   Follow up for UTI  The patient was last seen for this 2 weeks ago. Changes made at last visit include no changes.  She finished cipro that was given and took AZO.  Possible referral for urology.  On Wed 04/28/19 she had the feeling of a strong urge to urinate again but she advises that she feels better now.   Pt is in a study through [place] for bladder reconstruction.  She reports excellent compliance with treatment. She feels that condition is Improved. She is not having side effects.   ------------------------------------------------------------------------------------  Past Medical History:  Diagnosis Date  . Atypical ductal hyperplasia of right breast 2014  . Esophageal spasm   . Hypothyroidism    Past Surgical History:  Procedure Laterality Date  . ABDOMINAL HYSTERECTOMY  10/14  . BREAST BIOPSY Right 12/07/2012   stereo biopsy - positive  . BREAST LUMPECTOMY Right 01/01/2013   ADH -   . BREAST MASS EXCISION Right Jan 2014  . 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 Negative margins.l  . CHOLECYSTECTOMY  1984  . COLONOSCOPY WITH PROPOFOL N/A 01/15/2017   Procedure: COLONOSCOPY WITH PROPOFOL;  Surgeon: Robert Bellow, MD;  Location: [facility] ENDOSCOPY;  Service: Endoscopy;  Laterality: N/A;  . ESOPHAGOGASTRODUODENOSCOPY (EGD) WITH PROPOFOL N/A 01/15/2017   Procedure: ESOPHAGOGASTRODUODENOSCOPY (EGD) WITH PROPOFOL;  Surgeon: Robert Bellow, MD;  Location: [facility] ENDOSCOPY;  Service: Endoscopy;  Laterality: N/A;  . GASTRIC BYPASS  2004  . KNEE ARTHROSCOPY  Right 2011, Left 2012  . MASS EXCISION Bilateral 11/24/2015   Procedure: LEFT RING FINGER AND RIGHT THUMB MASS EXCISION  ;  Surgeon: Roseanne Kaufman, MD;  Location: [place];  Service: Orthopedics;  Laterality: Bilateral;  . MM BREAST STEREO BX*L*R/S  Dec 2013  . pannulectomy     Family History  Problem Relation Age of Onset  . Lung cancer Mother   . Cervical cancer Mother   . Hypertension Mother   . Asthma Mother   . COPD Mother   . Dementia Mother   . Cervical cancer Sister   . Breast cancer Sister   . Heart failure Father   . Heart disease Father   . COPD Father   . Asthma Daughter   . Breast cancer Paternal Aunt 72   Allergies  Allergen Reactions  . Sulfa Antibiotics Nausea And Vomiting    Current Outpatient Medications:  .  B COMPLEX VITAMINS PO, Take by mouth., Disp: , Rfl:  .  cholecalciferol (VITAMIN D) 1000 UNITS tablet, Take 1,000 Units by mouth daily., Disp: , Rfl:  .  ciprofloxacin (CIPRO) 500 MG tablet, Take 1 tablet (500 mg total) by mouth 2 (two) times daily., Disp: 14 tablet, Rfl: 0 .  cyclobenzaprine (FLEXERIL) 5 MG tablet, ONE PILL DAILY FOR BACK SPASMS, Disp: 30 tablet, Rfl: 5 .  fluticasone (FLONASE) 50 MCG/ACT nasal spray, PLACE 2 SPRAYS INTO BOTH NOSTRILS DAILY., Disp: 16 g, Rfl: 12 .  gabapentin (NEURONTIN) 100 MG capsule, Take 1 capsule (100 mg total) by mouth 2 (two) times daily., Disp: 60 capsule, Rfl: 12 .  levothyroxine (  SYNTHROID) 100 MCG tablet, TAKE 1 TABLET BY MOUTH EVERY DAY, Disp: 30 tablet, Rfl: 5 .  loratadine (CLARITIN) 10 MG tablet, TAKE 1 TABLET (10 MG TOTAL) BY MOUTH DAILY., Disp: 30 tablet, Rfl: 11 .  Multiple Vitamin (MULTIVITAMIN) LIQD, Take 5 mLs by mouth daily., Disp: , Rfl:  .  nitrofurantoin (MACRODANTIN) 50 MG capsule, TAKE 1 CAPSULE (50 MG TOTAL) BY MOUTH DAILY., Disp: 30 capsule, Rfl: 2 .  ondansetron (ZOFRAN) 8 MG tablet, Take 1 tablet (8 mg total) by mouth every 8 (eight) hours as needed for nausea or vomiting., Disp: 60 tablet, Rfl: 0 .  oxybutynin (DITROPAN XL) 10 MG 24 hr tablet, Take 1 tablet (10 mg total) by mouth at bedtime.,  Disp: 30 tablet, Rfl: 11 .  pantoprazole (PROTONIX) 40 MG tablet, TAKE 1 TABLET (40 MG TOTAL) BY MOUTH DAILY., Disp: 90 tablet, Rfl: 3 .  ranitidine (ZANTAC) 150 MG tablet, Take 150 mg by mouth 2 (two) times daily., Disp: , Rfl:  .  sucralfate (CARAFATE) 1 g tablet, Take 1 tablet (1 g total) by mouth 4 (four) times daily., Disp: 120 tablet, Rfl: 11 .  vitamin B-12 (CYANOCOBALAMIN) 1000 MCG tablet, Take 1,000 mcg by mouth daily., Disp: , Rfl:   Review of Systems  Genitourinary: Positive for urgency (she is having pressure still).  All other systems reviewed and are negative.  Social History   Tobacco Use  . Smoking status: Never Smoker  . Smokeless tobacco: Never Used  Substance Use Topics  . Alcohol use: No     Objective:   BP (!) 144/80 (BP Location: Right Arm, Patient Position: Sitting, Cuff Size: Large)   Pulse 73   Temp 98.3 F (36.8 C) (Oral)   Ht 5\' 6"  (1.676 m)   Wt 253 lb 3.2 oz (114.9 kg)   LMP 11/28/1990   SpO2 98%   BMI 40.87 kg/m  Vitals:   04/30/19 1341  BP: (!) 144/80  Pulse: 73  Temp: 98.3 F (36.8 C)  TempSrc: Oral  SpO2: 98%  Weight: 253 lb 3.2 oz (114.9 kg)  Height: 5\' 6"  (1.676 m)   Physical Exam Constitutional:      General: She is not in acute distress.    Appearance: She is well-developed.  HENT:     Head: Normocephalic and atraumatic.     Right Ear: Hearing normal.     Left Ear: Hearing normal.     Nose: Nose normal.  Eyes:     General: Lids are normal. No scleral icterus.       Right eye: No discharge.        Left eye: No discharge.     Conjunctiva/sclera: Conjunctivae normal.  Cardiovascular:     Rate and Rhythm: Normal rate and regular rhythm.  Pulmonary:     Effort: Pulmonary effort is normal. No respiratory distress.  Abdominal:     General: Bowel sounds are normal. There is no distension.     Palpations: There is no mass.     Tenderness: There is no guarding or rebound.  Musculoskeletal: Normal range of motion.  Skin:     Findings: No lesion or rash.  Neurological:     Mental Status: She is alert and oriented to person, place, and time.  Psychiatric:        Speech: Speech normal.        Behavior: Behavior normal.        Thought Content: Thought content normal.       Assessment &  Plan    1. Cystitis Has taken Cipro 500 mg BID for the past 2 weeks. Urine culture on 04-16-19 only showed mixed bacterial growth of 10,000-20,000 colonies. Still having bladder pressure and urge incontinence. A little better yesterday and today with less frequency. Urinalysis today essentially normal. May go back to prophylactic Nitrofurantoin 50 mg qd. Recommend evaluation by urologist. - POCT Urinalysis Dipstick  2. Urge incontinence of urine Had flare of urge incontinence 2 days ago despite treatment of cystitis and use of Oxybutynin. History of pelvic reconstruction surgery for severe prolapse by Dr. Carma Leaven 09-28-13 (Uro[facility]GYN at [facility]). Wants to contact Dr. Carma Leaven for follow up and possible cystoscopy - may need urethral dilation.      Vernie Murders, PA  [facility]

## 2019-06-14 ENCOUNTER — Other Ambulatory Visit: Payer: Self-pay | Admitting: *Deleted

## 2019-06-14 DIAGNOSIS — N6091 Unspecified benign mammary dysplasia of right breast: Secondary | ICD-10-CM

## 2019-06-17 ENCOUNTER — Other Ambulatory Visit: Payer: Self-pay

## 2019-06-17 DIAGNOSIS — N6091 Unspecified benign mammary dysplasia of right breast: Secondary | ICD-10-CM

## 2019-06-17 DIAGNOSIS — Z1231 Encounter for screening mammogram for malignant neoplasm of breast: Secondary | ICD-10-CM

## 2019-06-28 ENCOUNTER — Other Ambulatory Visit: Payer: Self-pay

## 2019-06-28 ENCOUNTER — Encounter: Payer: Self-pay | Admitting: Family Medicine

## 2019-06-28 ENCOUNTER — Ambulatory Visit (INDEPENDENT_AMBULATORY_CARE_PROVIDER_SITE_OTHER): Payer: 59 | Admitting: Family Medicine

## 2019-06-28 VITALS — BP 140/90 | Temp 98.6°F | Wt 251.0 lb

## 2019-06-28 DIAGNOSIS — E039 Hypothyroidism, unspecified: Secondary | ICD-10-CM | POA: Diagnosis not present

## 2019-06-28 DIAGNOSIS — N3090 Cystitis, unspecified without hematuria: Secondary | ICD-10-CM

## 2019-06-28 DIAGNOSIS — R7989 Other specified abnormal findings of blood chemistry: Secondary | ICD-10-CM

## 2019-06-28 DIAGNOSIS — R945 Abnormal results of liver function studies: Secondary | ICD-10-CM | POA: Diagnosis not present

## 2019-06-28 DIAGNOSIS — D649 Anemia, unspecified: Secondary | ICD-10-CM

## 2019-06-28 NOTE — Progress Notes (Signed)
Patient: Kathy Mcintosh Female    DOB: 27-Jun-1956   63 y.o.   MRN: [id number] Visit Date: 06/28/2019  Today's Provider: Wilhemena Durie, MD   Chief Complaint  Patient presents with  . Hypothyroidism  . Back Pain   Subjective:    HPI  Hypothyroid, follow-up:  TSH  Date Value Ref Range Status  12/24/2018 3.200 0.450 - 4.500 uIU/mL Final  04/10/2017 2.870 0.450 - 4.500 uIU/mL Final  11/08/2015 4.020 0.450 - 4.500 uIU/mL Final   Wt Readings from Last 3 Encounters:  06/28/19 251 lb (113.9 kg)  04/30/19 253 lb 3.2 oz (114.9 kg)  03/25/19 248 lb 3.2 oz (112.6 kg)    She was last seen for hypothyroid 1 years ago.  Management since that visit includes none. She reports good compliance with treatment. She is not having side effects.  She is not exercising. She is experiencing weight changes She denies change in energy level, diarrhea, heat / cold intolerance, nervousness and palpitations Weight trend: fluctuating a bit  ------------------------------------------------------------------------  Back Pain Patient states that her back pain is good and the Gabapentin is helping with pain.  Pt is scheduled to have botox bladder injections this week at [facility].,  Pt has h/o anemia and mild abnormal liver functions.  Allergies  Allergen Reactions  . Sulfa Antibiotics Nausea And Vomiting     Current Outpatient Medications:  .  B COMPLEX VITAMINS PO, Take by mouth., Disp: , Rfl:  .  cholecalciferol (VITAMIN D) 1000 UNITS tablet, Take 1,000 Units by mouth daily., Disp: , Rfl:  .  cyclobenzaprine (FLEXERIL) 5 MG tablet, ONE PILL DAILY FOR BACK SPASMS, Disp: 30 tablet, Rfl: 5 .  fluticasone (FLONASE) 50 MCG/ACT nasal spray, PLACE 2 SPRAYS INTO BOTH NOSTRILS DAILY., Disp: 16 g, Rfl: 12 .  gabapentin (NEURONTIN) 100 MG capsule, Take 1 capsule (100 mg total) by mouth 2 (two) times daily., Disp: 60 capsule, Rfl: 12 .  levothyroxine (SYNTHROID) 100 MCG tablet, TAKE 1 TABLET BY  MOUTH EVERY DAY, Disp: 30 tablet, Rfl: 5 .  loratadine (CLARITIN) 10 MG tablet, TAKE 1 TABLET (10 MG TOTAL) BY MOUTH DAILY., Disp: 30 tablet, Rfl: 11 .  Multiple Vitamin (MULTIVITAMIN) LIQD, Take 5 mLs by mouth daily., Disp: , Rfl:  .  ondansetron (ZOFRAN) 8 MG tablet, Take 1 tablet (8 mg total) by mouth every 8 (eight) hours as needed for nausea or vomiting., Disp: 60 tablet, Rfl: 0 .  oxybutynin (DITROPAN XL) 10 MG 24 hr tablet, Take 1 tablet (10 mg total) by mouth at bedtime., Disp: 30 tablet, Rfl: 11 .  pantoprazole (PROTONIX) 40 MG tablet, TAKE 1 TABLET (40 MG TOTAL) BY MOUTH DAILY., Disp: 90 tablet, Rfl: 3 .  ranitidine (ZANTAC) 150 MG tablet, Take 150 mg by mouth 2 (two) times daily., Disp: , Rfl:  .  sucralfate (CARAFATE) 1 g tablet, Take 1 tablet (1 g total) by mouth 4 (four) times daily., Disp: 120 tablet, Rfl: 11 .  trimethoprim (TRIMPEX) 100 MG tablet, Take 100 mg by mouth daily., Disp: , Rfl:  .  vitamin B-12 (CYANOCOBALAMIN) 1000 MCG tablet, Take 1,000 mcg by mouth daily., Disp: , Rfl:  .  nitrofurantoin (MACRODANTIN) 50 MG capsule, TAKE 1 CAPSULE (50 MG TOTAL) BY MOUTH DAILY. (Patient not taking: Reported on 06/28/2019), Disp: 30 capsule, Rfl: 2  Review of Systems  Constitutional: Negative.   Eyes: Negative.   Respiratory: Negative.   Cardiovascular: Negative.   Endocrine: Negative.  Genitourinary: Negative.   Skin: Negative.   Allergic/Immunologic: Negative.   Neurological: Negative.   Hematological: Negative.   Psychiatric/Behavioral: Negative.     Social History   Tobacco Use  . Smoking status: Never Smoker  . Smokeless tobacco: Never Used  Substance Use Topics  . Alcohol use: No      Objective:   BP 140/90 (BP Location: Left Arm, Patient Position: Sitting, Cuff Size: Normal)   Temp 98.6 F (37 C) (Oral)   Wt 251 lb (113.9 kg)   LMP 11/28/1990   BMI 40.51 kg/m  Vitals:   06/28/19 0939  BP: 140/90  Temp: 98.6 F (37 C)  TempSrc: Oral  Weight: 251 lb  (113.9 kg)     Physical Exam Vitals signs reviewed.  Constitutional:      Appearance: She is well-developed.  HENT:     Head: Normocephalic and atraumatic.     Right Ear: External ear normal.     Left Ear: External ear normal.     Nose: Nose normal.  Eyes:     General: No scleral icterus.    Conjunctiva/sclera: Conjunctivae normal.     Pupils: Pupils are equal, round, and reactive to light.  Neck:     Musculoskeletal: Normal range of motion and neck supple.  Cardiovascular:     Rate and Rhythm: Normal rate and regular rhythm.     Heart sounds: Normal heart sounds.  Pulmonary:     Effort: Pulmonary effort is normal.     Breath sounds: Normal breath sounds.  Abdominal:     General: Bowel sounds are normal.     Palpations: Abdomen is soft.     Tenderness: There is no abdominal tenderness.  Musculoskeletal: Normal range of motion.  Skin:    General: Skin is warm and dry.  Neurological:     General: No focal deficit present.     Mental Status: She is alert and oriented to person, place, and time. Mental status is at baseline.     Deep Tendon Reflexes: Reflexes are normal and symmetric.  Psychiatric:        Mood and Affect: Mood normal.        Behavior: Behavior normal.        Thought Content: Thought content normal.        Judgment: Judgment normal.      No results found for any visits on 06/28/19.     Assessment & Plan    I, Porsha McClurkin CMA, am acting as a scribe for Wilhemena Durie., MD.     1. Adult hypothyroidism  - TSH  2. Anemia, unspecified type Recheck CBC. - CBC with Differential/Platelet - Iron, TIBC and Ferritin Panel  3. Abnormal liver function test Likely fatty liver. - Hepatic function panel  4. Class 3 severe obesity due to excess calories in adult, unspecified BMI, unspecified whether serious comorbidity present ([place]) Lifestyle discussed. 5.Cystitis More than 50% 25 minute visit spent in counseling or coordination of care    Wilhemena Durie, MD  [facility]

## 2019-06-29 LAB — HEPATIC FUNCTION PANEL
ALT: 12 IU/L (ref 0–32)
AST: 18 IU/L (ref 0–40)
Albumin: 4 g/dL (ref 3.8–4.8)
Alkaline Phosphatase: 147 IU/L — ABNORMAL HIGH (ref 39–117)
Bilirubin Total: 0.3 mg/dL (ref 0.0–1.2)
Bilirubin, Direct: 0.1 mg/dL (ref 0.00–0.40)
Total Protein: 6.5 g/dL (ref 6.0–8.5)

## 2019-06-29 LAB — CBC WITH DIFFERENTIAL/PLATELET
Basophils Absolute: 0.1 10*3/uL (ref 0.0–0.2)
Basos: 1 %
EOS (ABSOLUTE): 0.2 10*3/uL (ref 0.0–0.4)
Eos: 3 %
Hematocrit: 32.7 % — ABNORMAL LOW (ref 34.0–46.6)
Hemoglobin: 10.4 g/dL — ABNORMAL LOW (ref 11.1–15.9)
Immature Grans (Abs): 0 10*3/uL (ref 0.0–0.1)
Immature Granulocytes: 0 %
Lymphocytes Absolute: 2 10*3/uL (ref 0.7–3.1)
Lymphs: 31 %
MCH: 23.6 pg — ABNORMAL LOW (ref 26.6–33.0)
MCHC: 31.8 g/dL (ref 31.5–35.7)
MCV: 74 fL — ABNORMAL LOW (ref 79–97)
Monocytes Absolute: 0.6 10*3/uL (ref 0.1–0.9)
Monocytes: 9 %
Neutrophils Absolute: 3.8 10*3/uL (ref 1.4–7.0)
Neutrophils: 56 %
Platelets: 271 10*3/uL (ref 150–450)
RBC: 4.41 x10E6/uL (ref 3.77–5.28)
RDW: 16.7 % — ABNORMAL HIGH (ref 11.7–15.4)
WBC: 6.6 10*3/uL (ref 3.4–10.8)

## 2019-06-29 LAB — IRON,TIBC AND FERRITIN PANEL
Ferritin: 8 ng/mL — ABNORMAL LOW (ref 15–150)
Iron Saturation: 9 % — CL (ref 15–55)
Iron: 29 ug/dL (ref 27–139)
Total Iron Binding Capacity: 341 ug/dL (ref 250–450)
UIBC: 312 ug/dL (ref 118–369)

## 2019-06-29 LAB — TSH: TSH: 2.1 u[IU]/mL (ref 0.450–4.500)

## 2019-07-08 ENCOUNTER — Telehealth: Payer: Self-pay

## 2019-07-08 NOTE — Telephone Encounter (Signed)
LVMTRC 

## 2019-07-08 NOTE — Telephone Encounter (Signed)
Advised  ED 

## 2019-07-08 NOTE — Telephone Encounter (Signed)
-----   Message from Jerrol Banana., MD sent at 07/08/2019  9:32 AM EDT ----- Recommend daily iron if she can take it.

## 2019-07-26 ENCOUNTER — Other Ambulatory Visit: Payer: Self-pay | Admitting: Family Medicine

## 2019-07-26 DIAGNOSIS — M461 Sacroiliitis, not elsewhere classified: Secondary | ICD-10-CM

## 2019-07-26 NOTE — Telephone Encounter (Signed)
Please review. Thanks!  

## 2019-07-26 NOTE — Telephone Encounter (Signed)
[facility] faxed refill request for the following medications:  cyclobenzaprine (FLEXERIL) 5 MG tablet     Please advise.  

## 2019-07-27 MED ORDER — CYCLOBENZAPRINE HCL 5 MG PO TABS: 5.0000 mg | ORAL_TABLET | Freq: Every day | ORAL | 5 refills | Status: DC

## 2019-08-05 ENCOUNTER — Ambulatory Visit: Payer: 59 | Admitting: General Surgery

## 2019-08-06 ENCOUNTER — Ambulatory Visit
Admission: RE | Admit: 2019-08-06 | Discharge: 2019-08-06 | Disposition: A | Discharge status: Home / Self Care | Payer: 59 | Source: Ambulatory Visit | Attending: General Surgery | Admitting: General Surgery

## 2019-08-06 ENCOUNTER — Other Ambulatory Visit: Payer: Self-pay

## 2019-08-06 DIAGNOSIS — Z1231 Encounter for screening mammogram for malignant neoplasm of breast: Secondary | ICD-10-CM | POA: Insufficient documentation

## 2019-08-18 ENCOUNTER — Ambulatory Visit: Payer: 59 | Admitting: Surgery

## 2019-08-27 ENCOUNTER — Other Ambulatory Visit: Payer: Self-pay | Admitting: Family Medicine

## 2019-08-27 DIAGNOSIS — K219 Gastro-esophageal reflux disease without esophagitis: Secondary | ICD-10-CM

## 2019-08-27 DIAGNOSIS — K259 Gastric ulcer, unspecified as acute or chronic, without hemorrhage or perforation: Secondary | ICD-10-CM

## 2019-08-27 MED ORDER — PANTOPRAZOLE SODIUM 40 MG PO TBEC: 40.0000 mg | DELAYED_RELEASE_TABLET | Freq: Every day | ORAL | 3 refills | Status: DC

## 2019-08-27 NOTE — Telephone Encounter (Signed)
[facility] faxed refill request for the following medications:  pantoprazole (PROTONIX) 40 MG tablet   LOV: 06/28/2019 Please advise. Thanks TNP

## 2019-10-04 ENCOUNTER — Other Ambulatory Visit: Payer: Self-pay | Admitting: Family Medicine

## 2019-10-06 ENCOUNTER — Encounter: Payer: Self-pay | Admitting: *Deleted

## 2019-10-23 IMAGING — RF DG UGI W/O KUB
14 of 21 series · 16 of 24 positions shown · non-contrast
Comparison: None.

CLINICAL DATA: Status post prior gastric bypass.

EXAM:
UPPER GI SERIES WITHOUT KUB
TECHNIQUE: Routine upper GI series was performed with thin and thick barium.
FLUOROSCOPY TIME:  Fluoroscopy Time:  2.1 minute
Radiation Exposure Index (if provided by the fluoroscopic device):
44.6 mGy
Number of Acquired Spot Images: 0

[Series 1: cp_standard · 0.51mm/px · 3 of 25 frames shown (1 of 14)]
[frame 4/25]
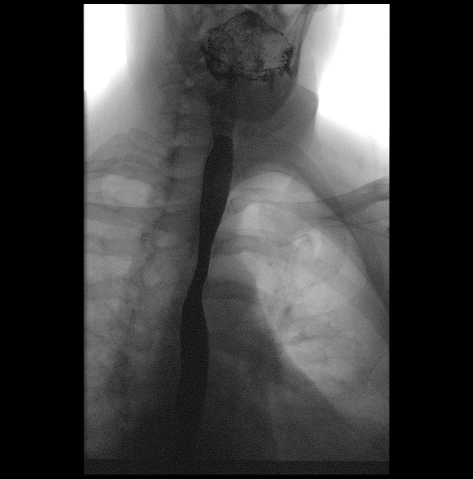
[frame 13/25]
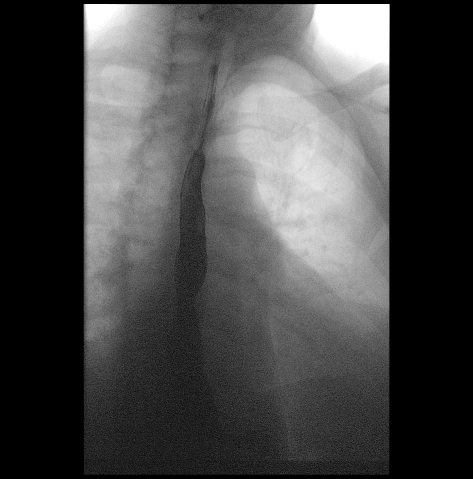
[frame 22/25]
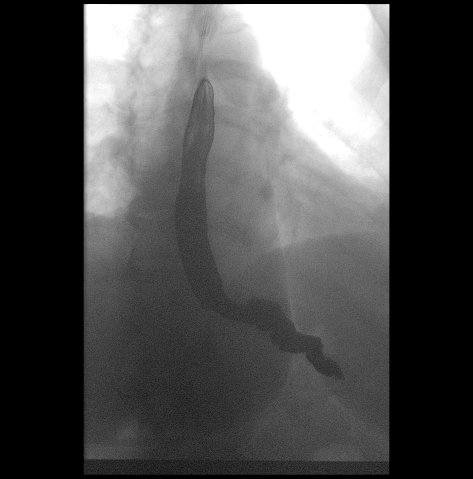

[Series 4: cp_standard · 0.25mm/px · 1 of 1 slices shown (2 of 14)]
[im 1/1]
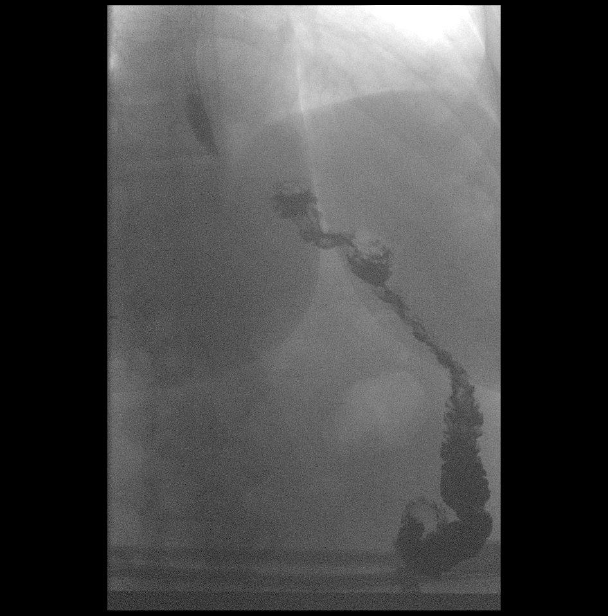

[Series 5: cp_standard · 0.25mm/px · 1 of 1 slices shown (3 of 14)]
[im 1/1]
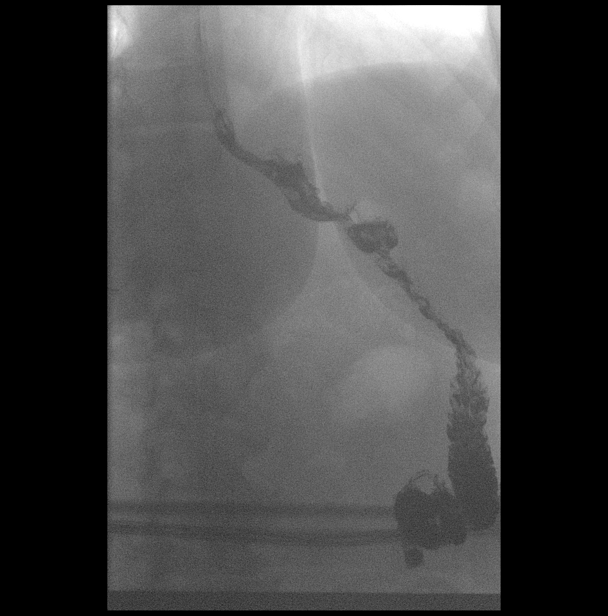

[Series 7: cp_standard · 0.25mm/px · 1 of 1 slices shown (4 of 14)]
[im 1/1]
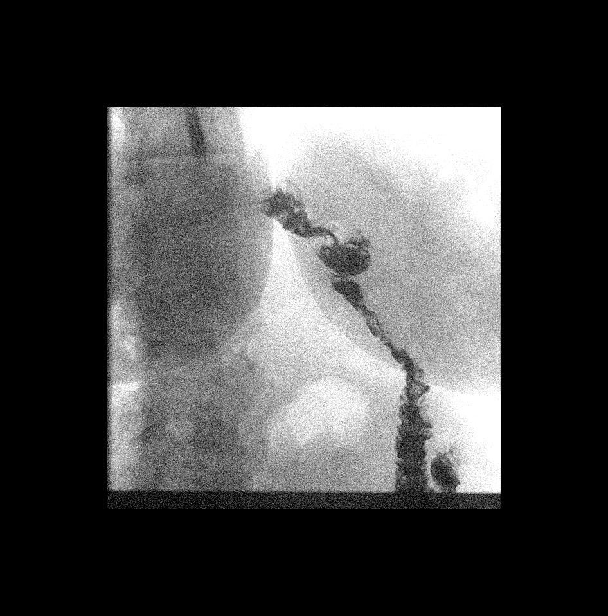

[Series 8: cp_standard · 0.25mm/px · 1 of 1 slices shown (5 of 14)]
[im 1/1]
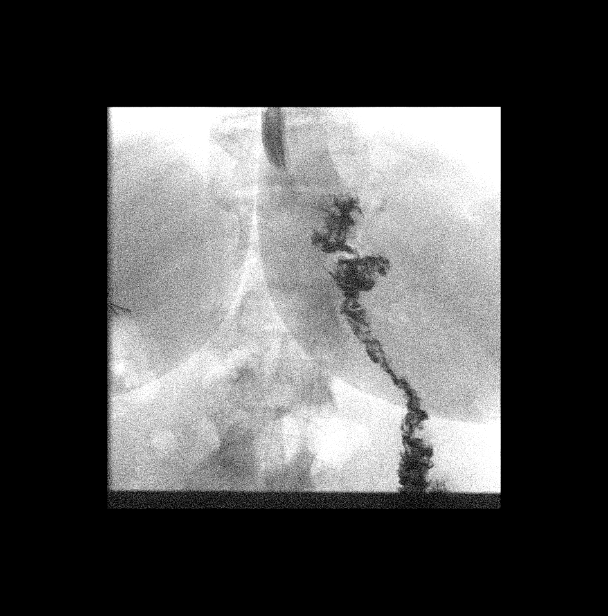

[Series 10: cp_standard · 0.25mm/px · 1 of 1 slices shown (6 of 14)]
[im 1/1]
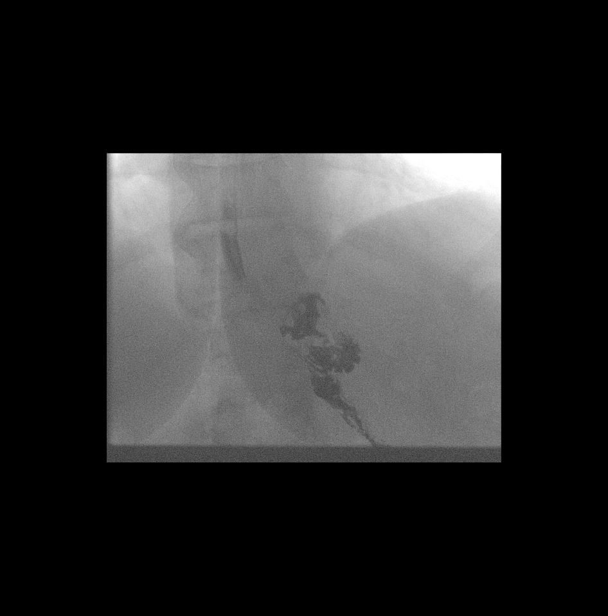

[Series 12: cp_standard · 0.25mm/px · 1 of 1 slices shown (7 of 14)]
[im 1/1]
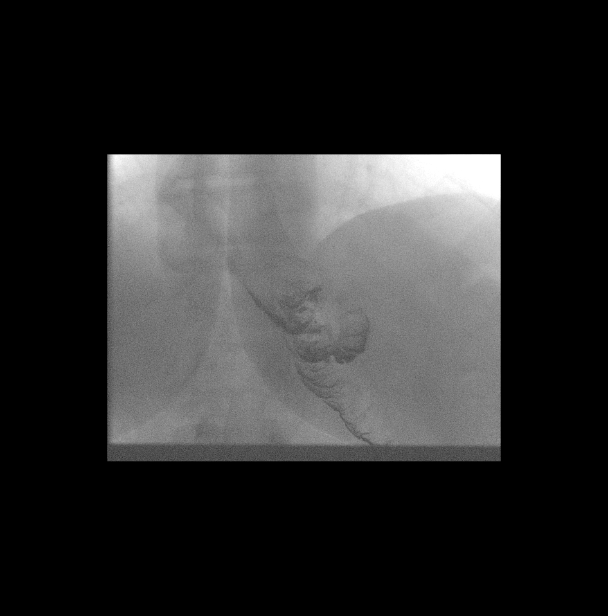

[Series 14: cp_standard · 0.25mm/px · 1 of 1 slices shown (8 of 14)]
[im 1/1]
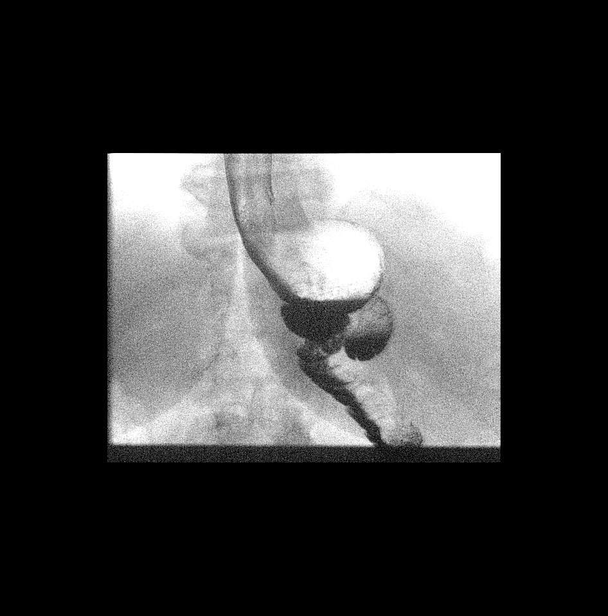

[Series 15: cp_standard · 0.25mm/px · 1 of 1 slices shown (9 of 14)]
[im 1/1]
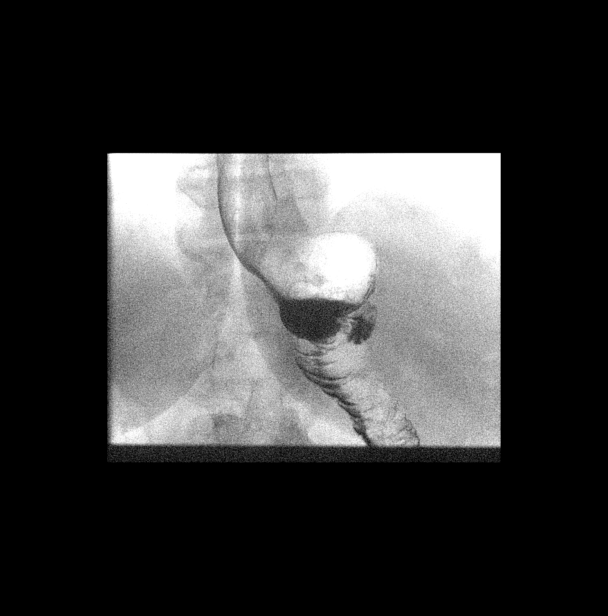

[Series 17: cp_standard · 0.27mm/px · 1 of 1 slices shown (10 of 14)]
[im 1/1]
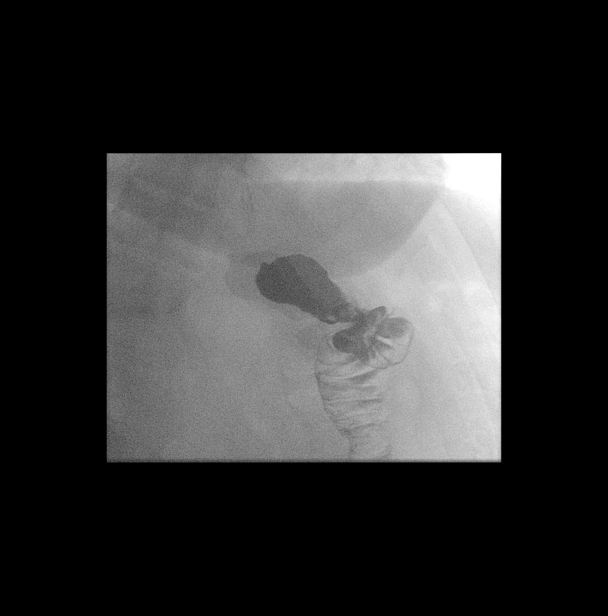

[Series 18: cp_standard · 0.27mm/px · 1 of 1 slices shown (11 of 14)]
[im 1/1]
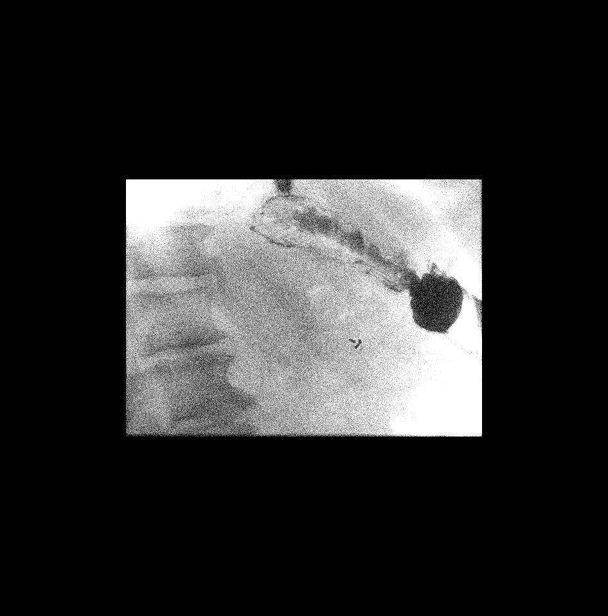

[Series 21: cp_standard · 0.28mm/px · 1 of 1 slices shown (12 of 14)]
[im 1/1]
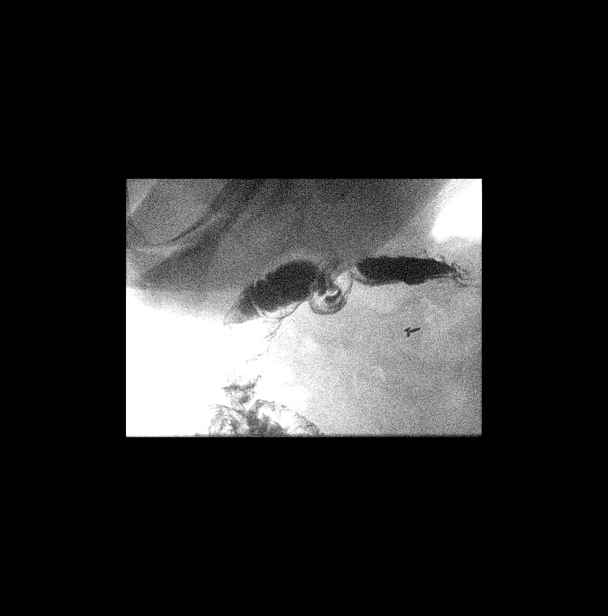

[Series 22: cp_standard · 0.28mm/px · 1 of 1 slices shown (13 of 14)]
[im 1/1]
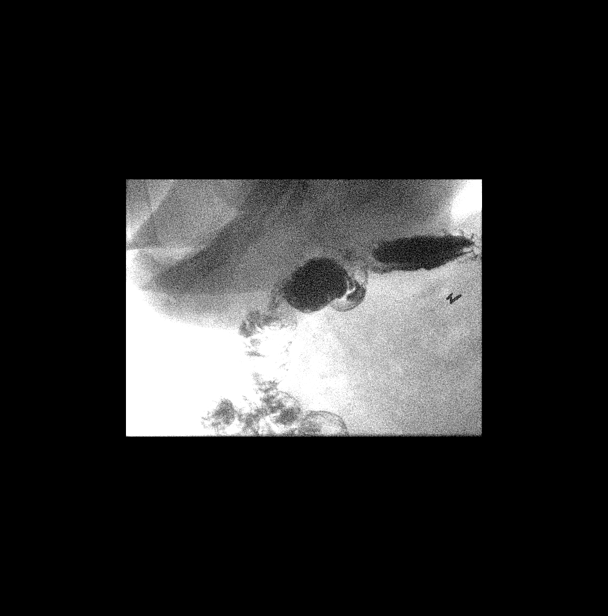

[Series 24: cp_standard · 0.28mm/px · 1 of 1 slices shown (14 of 14)]
[im 1/1]
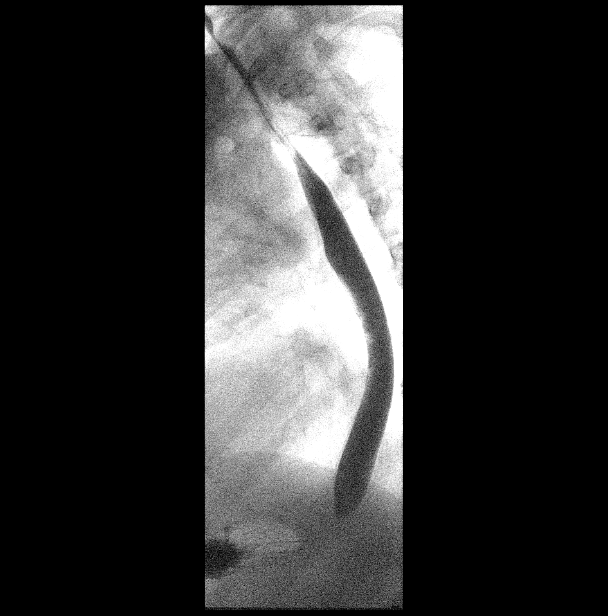

[16 of 24 positions shown; findings below may reference images not displayed]

FINDINGS: Examination of the esophagus demonstrated normal esophageal
motility. Normal esophageal morphology without evidence of
esophagitis or ulceration. No esophageal stricture, diverticula, or
mass lesion. No evidence of hiatal hernia. No spontaneous or
inducible gastroesophageal reflux.

Prior gastric bypass. Normal gastric remanent without evidence of
ulceration, scarring, or mass lesion. Gastric emptying was normal.
No contrast present in the excluded portion of the stomach.
IMPRESSION: Remote gastric bypass without focal abnormality.

## 2019-12-20 ENCOUNTER — Ambulatory Visit: Payer: Self-pay | Admitting: Family Medicine

## 2019-12-23 ENCOUNTER — Other Ambulatory Visit: Payer: 59

## 2019-12-23 ENCOUNTER — Ambulatory Visit: Payer: 59 | Attending: Internal Medicine

## 2019-12-23 DIAGNOSIS — Z20822 Contact with and (suspected) exposure to covid-19: Secondary | ICD-10-CM

## 2019-12-25 LAB — NOVEL CORONAVIRUS, NAA: SARS-CoV-2, NAA: NOT DETECTED

## 2020-01-03 ENCOUNTER — Ambulatory Visit: Payer: Self-pay | Admitting: Family Medicine

## 2020-01-03 ENCOUNTER — Telehealth: Payer: Self-pay | Admitting: Family Medicine

## 2020-01-03 NOTE — Telephone Encounter (Signed)
[facility] faxed refill request for the following medications:  gabapentin (NEURONTIN) 100 MG capsule    Please advise.

## 2020-01-04 MED ORDER — GABAPENTIN 100 MG PO CAPS: 100.0000 mg | ORAL_CAPSULE | Freq: Two times a day (BID) | ORAL | 12 refills | Status: DC

## 2020-01-26 NOTE — Progress Notes (Signed)
Patient: Kathy Mcintosh Female    DOB: 02-Sep-1956   64 y.o.   MRN: [id number] Visit Date: 01/26/2020  Today's Provider: Wilhemena Durie, MD   No chief complaint on file.  Subjective:     HPI  Chronic back pain is followed by Dr. Santa Lighter from physiatry. Adult hypothyroidism From 06/28/2019-labs checked, no changes.  Anemia, unspecified type From 06/28/2019-labs checked,Recommended daily iron if she can take it. She states she is now on daily iron therapy Abnormal liver function test From 06/28/2019-Likely fatty liver. Labs checked, no changes.  Class 3 severe obesity due to excess calories in adult, unspecified BMI, unspecified whether serious comorbidity present ([place]) From 06/28/2019-Lifestyle discussed.   Allergies  Allergen Reactions  . Sulfa Antibiotics Nausea And Vomiting     Current Outpatient Medications:  .  B COMPLEX VITAMINS PO, Take by mouth., Disp: , Rfl:  .  cholecalciferol (VITAMIN D) 1000 UNITS tablet, Take 1,000 Units by mouth daily., Disp: , Rfl:  .  cyclobenzaprine (FLEXERIL) 5 MG tablet, Take 1 tablet (5 mg total) by mouth at bedtime., Disp: 30 tablet, Rfl: 5 .  fluticasone (FLONASE) 50 MCG/ACT nasal spray, PLACE 2 SPRAYS INTO BOTH NOSTRILS DAILY., Disp: 16 g, Rfl: 12 .  gabapentin (NEURONTIN) 100 MG capsule, Take 1 capsule (100 mg total) by mouth 2 (two) times daily., Disp: 60 capsule, Rfl: 12 .  levothyroxine (SYNTHROID) 100 MCG tablet, TAKE 1 TABLET BY MOUTH EVERY DAY, Disp: 30 tablet, Rfl: 5 .  loratadine (CLARITIN) 10 MG tablet, TAKE 1 TABLET (10 MG TOTAL) BY MOUTH DAILY., Disp: 30 tablet, Rfl: 11 .  Multiple Vitamin (MULTIVITAMIN) LIQD, Take 5 mLs by mouth daily., Disp: , Rfl:  .  nitrofurantoin (MACRODANTIN) 50 MG capsule, TAKE 1 CAPSULE (50 MG TOTAL) BY MOUTH DAILY. (Patient not taking: Reported on 06/28/2019), Disp: 30 capsule, Rfl: 2 .  ondansetron (ZOFRAN) 8 MG tablet, Take 1 tablet (8 mg total) by mouth every 8 (eight) hours as needed  for nausea or vomiting., Disp: 60 tablet, Rfl: 0 .  oxybutynin (DITROPAN XL) 10 MG 24 hr tablet, Take 1 tablet (10 mg total) by mouth at bedtime., Disp: 30 tablet, Rfl: 11 .  pantoprazole (PROTONIX) 40 MG tablet, Take 1 tablet (40 mg total) by mouth daily., Disp: 90 tablet, Rfl: 3 .  ranitidine (ZANTAC) 150 MG tablet, Take 150 mg by mouth 2 (two) times daily., Disp: , Rfl:  .  sucralfate (CARAFATE) 1 g tablet, Take 1 tablet (1 g total) by mouth 4 (four) times daily., Disp: 120 tablet, Rfl: 11 .  trimethoprim (TRIMPEX) 100 MG tablet, Take 100 mg by mouth daily., Disp: , Rfl:  .  vitamin B-12 (CYANOCOBALAMIN) 1000 MCG tablet, Take 1,000 mcg by mouth daily., Disp: , Rfl:   Review of Systems  Constitutional: Negative for appetite change, chills, fatigue and fever.  HENT: Negative.   Eyes: Negative.   Respiratory: Negative for chest tightness and shortness of breath.   Cardiovascular: Negative for chest pain and palpitations.  Gastrointestinal: Negative for abdominal pain, nausea and vomiting.  Musculoskeletal: Positive for back pain.  Allergic/Immunologic: Negative.   Neurological: Negative for dizziness and weakness.  Psychiatric/Behavioral: Negative.     Social History   Tobacco Use  . Smoking status: Never Smoker  . Smokeless tobacco: Never Used  Substance Use Topics  . Alcohol use: No      Objective:   LMP 11/28/1990  There were no vitals filed for this visit.There is  no height or weight on file to calculate BMI.   Physical Exam Vitals reviewed.  Constitutional:      Appearance: She is well-developed.  HENT:     Head: Normocephalic and atraumatic.     Right Ear: External ear normal.     Left Ear: External ear normal.     Nose: Nose normal.  Eyes:     General: No scleral icterus.    Conjunctiva/sclera: Conjunctivae normal.     Pupils: Pupils are equal, round, and reactive to light.  Cardiovascular:     Rate and Rhythm: Normal rate and regular rhythm.     Heart  sounds: Normal heart sounds.  Pulmonary:     Effort: Pulmonary effort is normal.     Breath sounds: Normal breath sounds.  Abdominal:     General: Bowel sounds are normal.     Palpations: Abdomen is soft.     Tenderness: There is no abdominal tenderness.  Musculoskeletal:        General: Normal range of motion.     Cervical back: Normal range of motion and neck supple.  Skin:    General: Skin is warm and dry.  Neurological:     General: No focal deficit present.     Mental Status: She is alert and oriented to person, place, and time. Mental status is at baseline.     Deep Tendon Reflexes: Reflexes are normal and symmetric.  Psychiatric:        Mood and Affect: Mood normal.        Behavior: Behavior normal.        Thought Content: Thought content normal.        Judgment: Judgment normal.      No results found for any visits on 01/27/20.     Assessment & Plan    1. Adult hypothyroidism  - Comprehensive metabolic panel - CBC with Differential/Platelet - TSH - Iron, TIBC and Ferritin Panel  2. Anemia, unspecified type Check anemia and iron level on iron therapy. - Comprehensive metabolic panel - CBC with Differential/Platelet - TSH - Iron, TIBC and Ferritin Panel  3. Gastroesophageal reflux disease without esophagitis Clinically stable on pantoprazole - Comprehensive metabolic panel - CBC with Differential/Platelet - TSH - Iron, TIBC and Ferritin Panel  4. Essential hypertension   5. Arthritis   6. Avitaminosis D   7. Class 3 severe obesity due to excess calories with serious comorbidity and body mass index (BMI) of 40.0 to 44.9 in adult [facility]) With reflux and arthritis.  She is gained 6 pounds since her last visit.  Lifestyle changes with  weight loss is recommended.     Richard Cranford Mon, MD  [facility]

## 2020-01-27 ENCOUNTER — Encounter: Payer: Self-pay | Admitting: Family Medicine

## 2020-01-27 ENCOUNTER — Other Ambulatory Visit: Payer: Self-pay

## 2020-01-27 ENCOUNTER — Ambulatory Visit (INDEPENDENT_AMBULATORY_CARE_PROVIDER_SITE_OTHER): Payer: 59 | Admitting: Family Medicine

## 2020-01-27 VITALS — BP 129/86 | HR 69 | Temp 96.2°F | Resp 18 | Ht 66.0 in | Wt 257.0 lb

## 2020-01-27 DIAGNOSIS — M1990 Unspecified osteoarthritis, unspecified site: Secondary | ICD-10-CM

## 2020-01-27 DIAGNOSIS — K219 Gastro-esophageal reflux disease without esophagitis: Secondary | ICD-10-CM | POA: Diagnosis not present

## 2020-01-27 DIAGNOSIS — Z6841 Body Mass Index (BMI) 40.0 and over, adult: Secondary | ICD-10-CM

## 2020-01-27 DIAGNOSIS — I10 Essential (primary) hypertension: Secondary | ICD-10-CM

## 2020-01-27 DIAGNOSIS — E039 Hypothyroidism, unspecified: Secondary | ICD-10-CM

## 2020-01-27 DIAGNOSIS — E559 Vitamin D deficiency, unspecified: Secondary | ICD-10-CM

## 2020-01-27 DIAGNOSIS — D649 Anemia, unspecified: Secondary | ICD-10-CM

## 2020-01-28 LAB — IRON,TIBC AND FERRITIN PANEL
Ferritin: 100 ng/mL (ref 15–150)
Iron Saturation: 31 % (ref 15–55)
Iron: 84 ug/dL (ref 27–139)
Total Iron Binding Capacity: 275 ug/dL (ref 250–450)
UIBC: 191 ug/dL (ref 118–369)

## 2020-01-28 LAB — CBC WITH DIFFERENTIAL/PLATELET
Basophils Absolute: 0.1 10*3/uL (ref 0.0–0.2)
Basos: 1 %
EOS (ABSOLUTE): 0.2 10*3/uL (ref 0.0–0.4)
Eos: 3 %
Hematocrit: 41.9 % (ref 34.0–46.6)
Hemoglobin: 14.3 g/dL (ref 11.1–15.9)
Immature Grans (Abs): 0 10*3/uL (ref 0.0–0.1)
Immature Granulocytes: 0 %
Lymphocytes Absolute: 2.4 10*3/uL (ref 0.7–3.1)
Lymphs: 31 %
MCH: 31.5 pg (ref 26.6–33.0)
MCHC: 34.1 g/dL (ref 31.5–35.7)
MCV: 92 fL (ref 79–97)
Monocytes Absolute: 0.7 10*3/uL (ref 0.1–0.9)
Monocytes: 9 %
Neutrophils Absolute: 4.5 10*3/uL (ref 1.4–7.0)
Neutrophils: 56 %
Platelets: 243 10*3/uL (ref 150–450)
RBC: 4.54 x10E6/uL (ref 3.77–5.28)
RDW: 13.4 % (ref 11.7–15.4)
WBC: 8 10*3/uL (ref 3.4–10.8)

## 2020-01-28 LAB — COMPREHENSIVE METABOLIC PANEL
ALT: 11 IU/L (ref 0–32)
AST: 14 IU/L (ref 0–40)
Albumin/Globulin Ratio: 1.8 (ref 1.2–2.2)
Albumin: 4.1 g/dL (ref 3.8–4.8)
Alkaline Phosphatase: 166 IU/L — ABNORMAL HIGH (ref 39–117)
BUN/Creatinine Ratio: 19 (ref 12–28)
BUN: 12 mg/dL (ref 8–27)
Bilirubin Total: 0.5 mg/dL (ref 0.0–1.2)
CO2: 22 mmol/L (ref 20–29)
Calcium: 9.2 mg/dL (ref 8.7–10.3)
Chloride: 104 mmol/L (ref 96–106)
Creatinine, Ser: 0.64 mg/dL (ref 0.57–1.00)
GFR calc Af Amer: 109 mL/min/{1.73_m2} (ref >59)
GFR calc non Af Amer: 95 mL/min/{1.73_m2} (ref >59)
Globulin, Total: 2.3 g/dL (ref 1.5–4.5)
Glucose: 90 mg/dL (ref 65–99)
Potassium: 4.4 mmol/L (ref 3.5–5.2)
Sodium: 140 mmol/L (ref 134–144)
Total Protein: 6.4 g/dL (ref 6.0–8.5)

## 2020-01-28 LAB — TSH: TSH: 2.46 u[IU]/mL (ref 0.450–4.500)

## 2020-04-22 ENCOUNTER — Ambulatory Visit: Payer: 59 | Attending: Internal Medicine

## 2020-04-22 DIAGNOSIS — Z23 Encounter for immunization: Secondary | ICD-10-CM

## 2020-04-22 NOTE — Progress Notes (Signed)
   Covid-19 Vaccination Clinic  Name:  Kathy Mcintosh    MRN: [id number] DOB: 1956/04/03  04/22/2020  Kathy Mcintosh was observed post Covid-19 immunization for 15 minutes without incident. She was provided with Vaccine Information Sheet and instruction to access the V-Safe system.   Kathy Mcintosh was instructed to call 911 with any severe reactions post vaccine: Marland Kitchen Difficulty breathing  . Swelling of face and throat  . A fast heartbeat  . A bad rash all over body  . Dizziness and weakness   Immunizations Administered    Name Date Dose VIS Date Route   Pfizer COVID-19 Vaccine 04/22/2020 11:44 AM 0.3 mL 02/09/2019 Intramuscular   Manufacturer: [place]   Lot: [id number]   [place]: [id number]

## 2020-05-16 ENCOUNTER — Other Ambulatory Visit: Payer: Self-pay | Admitting: Family Medicine

## 2020-05-16 ENCOUNTER — Ambulatory Visit: Payer: 59 | Attending: Internal Medicine

## 2020-05-16 DIAGNOSIS — Z23 Encounter for immunization: Secondary | ICD-10-CM

## 2020-05-16 DIAGNOSIS — E039 Hypothyroidism, unspecified: Secondary | ICD-10-CM

## 2020-05-16 MED ORDER — LEVOTHYROXINE SODIUM 100 MCG PO TABS: 100.0000 ug | ORAL_TABLET | Freq: Every day | ORAL | 5 refills | Status: DC

## 2020-05-16 NOTE — Telephone Encounter (Signed)
Medication refill sent to pharmacy  

## 2020-05-16 NOTE — Progress Notes (Signed)
° °  Covid-19 Vaccination Clinic  Name:  GWYNETH ROUSSELLE    MRN: [id number] DOB: Jun 25, 1956  05/16/2020  Ms. Willette was observed post Covid-19 immunization for 15 minutes without incident. She was provided with Vaccine Information Sheet and instruction to access the V-Safe system.   Ms. Restrepo was instructed to call 911 with any severe reactions post vaccine:  Difficulty breathing   Swelling of face and throat   A fast heartbeat   A bad rash all over body   Dizziness and weakness   Immunizations Administered    Name Date Dose VIS Date Route   Pfizer COVID-19 Vaccine 05/16/2020  3:11 PM 0.3 mL 02/09/2019 Intramuscular   Manufacturer: [place]   Lot: [id number]   [place]: [id number]

## 2020-05-16 NOTE — Telephone Encounter (Signed)
[facility] faxed refill request for the following medications:  levothyroxine (SYNTHROID) 100 MCG tablet  Last Rx: 10/04/2019 with 5 refills LOV: 01/27/2020 NOV: 07/27/2020 Please advise. Thanks TNP

## 2020-06-14 ENCOUNTER — Other Ambulatory Visit: Payer: Self-pay | Admitting: Family Medicine

## 2020-06-14 DIAGNOSIS — M461 Sacroiliitis, not elsewhere classified: Secondary | ICD-10-CM

## 2020-06-14 NOTE — Telephone Encounter (Signed)
Requested medication (s) are due for refill today: yes  Requested medication (s) are on the active medication list: yes  Last refill:  05/17/2020  Future visit scheduled:yes  Notes to clinic:  this refill cannot be delegated    Requested Prescriptions  Pending Prescriptions Disp Refills   cyclobenzaprine (FLEXERIL) 5 MG tablet [Pharmacy Med Name: CYCLOBENZAPRINE 5MG  TABLETS] 30 tablet 5    Sig: TAKE 1 TABLET BY MOUTH EVERY NIGHT AT BEDTIME      Not Delegated - Analgesics:  Muscle Relaxants Failed - 06/14/2020 11:22 AM      Failed - This refill cannot be delegated      Passed - Valid encounter within last 6 months    Recent Outpatient Visits           4 months ago Adult hypothyroidism   [facility] Jerrol Banana., MD   11 months ago Adult hypothyroidism   [facility] Jerrol Banana., MD   1 year ago [place], Vickki Muff, [place]   1 year ago [place], Vickki Muff, [place]   1 year ago [place] Jerrol Banana., MD       Future Appointments             In 1 month Jerrol Banana., MD [facility], [place]

## 2020-07-27 ENCOUNTER — Other Ambulatory Visit: Payer: Self-pay

## 2020-07-27 ENCOUNTER — Ambulatory Visit (INDEPENDENT_AMBULATORY_CARE_PROVIDER_SITE_OTHER): Payer: 59 | Admitting: Family Medicine

## 2020-07-27 ENCOUNTER — Encounter: Payer: Self-pay | Admitting: Family Medicine

## 2020-07-27 VITALS — BP 121/87 | HR 73 | Temp 98.5°F | Resp 16 | Ht 66.0 in | Wt 256.0 lb

## 2020-07-27 DIAGNOSIS — Z0000 Encounter for general adult medical examination without abnormal findings: Secondary | ICD-10-CM | POA: Diagnosis not present

## 2020-07-27 DIAGNOSIS — N342 Other urethritis: Secondary | ICD-10-CM | POA: Diagnosis not present

## 2020-07-27 DIAGNOSIS — Z23 Encounter for immunization: Secondary | ICD-10-CM

## 2020-07-27 MED ORDER — PREMARIN 0.625 MG/GM VA CREA: 1.0000 | TOPICAL_CREAM | Freq: Every day | VAGINAL | 6 refills | Status: DC

## 2020-07-27 NOTE — Progress Notes (Signed)
I,Kathy Mcintosh,acting as a scribe for Wilhemena Durie, MD.,have documented all relevant documentation on the behalf of Wilhemena Durie, MD,as directed by  Wilhemena Durie, MD while in the presence of Wilhemena Durie, MD.  Medicare Initial Preventative Physical Exam   Patient: Kathy Mcintosh, Female    DOB: Jun 18, 1956, 64 y.o.   MRN: [id number] Visit Date: 07/27/2020  Today's Provider: Wilhemena Durie, MD   Subjective:    Chief Complaint  Patient presents with  . Medicare Wellness    Medicare Initial Preventative Physical Exam Kathy Mcintosh is a 64 y.o. female who presents today for her Initial Preventative Physical Exam.  She is followed by [facility] for her gynecology exams. HPI  Social History   Socioeconomic History  . Marital status: Married    Spouse name: Not on file  . Number of children: Not on file  . Years of education: Not on file  . Highest education level: Not on file  Occupational History  . Not on file  Tobacco Use  . Smoking status: Never Smoker  . Smokeless tobacco: Never Used  Vaping Use  . Vaping Use: Never used  Substance and Sexual Activity  . Alcohol use: No  . Drug use: No  . Sexual activity: Not on file  Other Topics Concern  . Not on file  Social History Narrative  . Not on file   Social Determinants of Health   Financial Resource Strain:   . Difficulty of Paying Living Expenses:   Food Insecurity:   . Worried About Charity fundraiser in the Last Year:   . Arboriculturist in the Last Year:   Transportation Needs:   . Film/video editor (Medical):   Marland Kitchen Lack of Transportation (Non-Medical):   Physical Activity:   . Days of Exercise per Week:   . Minutes of Exercise per Session:   Stress:   . Feeling of Stress :   Social Connections:   . Frequency of Communication with Friends and Family:   . Frequency of Social Gatherings with Friends and Family:   . Attends Religious Services:   . Active Member of Clubs or  Organizations:   . Attends Archivist Meetings:   Marland Kitchen Marital Status:   Intimate Partner Violence:   . Fear of Current or Ex-Partner:   . Emotionally Abused:   Marland Kitchen Physically Abused:   . Sexually Abused:     Past Medical History:  Diagnosis Date  . Atypical ductal hyperplasia of right breast 2014  . Esophageal spasm   . Hypothyroidism      Patient Active Problem List   Diagnosis Date Noted  . Encounter for screening colonoscopy 01/06/2017  . Epigastric pain 01/06/2017  . Arthritis 10/25/2015  . Age-related memory disorder 10/25/2015  . Bladder cystocele 10/25/2015  . Acid reflux 10/25/2015  . Hypercholesteremia 10/25/2015  . BP (high blood pressure) 10/25/2015  . Cannot sleep 10/25/2015  . Headache, migraine 10/25/2015  . 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 B12 deficiency 10/25/2015  . Avitaminosis D 10/25/2015  . Adiposity 09/21/2013  . Adult hypothyroidism 09/21/2013  . Uterovaginal prolapse 07/23/2013  . Atypical ductal hyperplasia, breast 06/22/2013  . Hypothyroidism 11/28/2012    Class: Chronic  . Vitamin D deficiency disease 11/28/2012    Past Surgical History:  Procedure Laterality Date  . ABDOMINAL HYSTERECTOMY  10/14  . BREAST BIOPSY Right 12/07/2012   stereo biopsy - positive  . BREAST LUMPECTOMY Right 01/01/2013  ADH -   . BREAST MASS EXCISION Right Jan 2014  . 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 Negative margins.l  . CHOLECYSTECTOMY  1984  . COLONOSCOPY WITH PROPOFOL N/A 01/15/2017   Procedure: COLONOSCOPY WITH PROPOFOL;  Surgeon: Robert Bellow, MD;  Location: [facility] ENDOSCOPY;  Service: Endoscopy;  Laterality: N/A;  . ESOPHAGOGASTRODUODENOSCOPY (EGD) WITH PROPOFOL N/A 01/15/2017   Procedure: ESOPHAGOGASTRODUODENOSCOPY (EGD) WITH PROPOFOL;  Surgeon: Robert Bellow, MD;  Location: [facility] ENDOSCOPY;  Service: Endoscopy;  Laterality: N/A;  . GASTRIC BYPASS  2004  . KNEE ARTHROSCOPY  Right  2011, Left 2012  . MASS EXCISION Bilateral 11/24/2015   Procedure: LEFT RING FINGER AND RIGHT THUMB MASS EXCISION ;  Surgeon: Roseanne Kaufman, MD;  Location: [facility];  Service: Orthopedics;  Laterality: Bilateral;  . MM BREAST STEREO BX*L*R/S  Dec 2013  . pannulectomy      Her family history includes Asthma in her daughter and mother; Breast cancer in her sister; Breast cancer (age of onset: 17) in her paternal aunt; COPD in her father and mother; Cervical cancer in her mother and sister; Dementia in her mother; Heart disease in her father; Heart failure in her father; Hypertension in her mother; Lung cancer in her mother.   Current Outpatient Medications:  .  B COMPLEX VITAMINS PO, Take by mouth., Disp: , Rfl:  .  cholecalciferol (VITAMIN D) 1000 UNITS tablet, Take 1,000 Units by mouth daily., Disp: , Rfl:  .  cyclobenzaprine (FLEXERIL) 5 MG tablet, TAKE 1 TABLET BY MOUTH EVERY NIGHT AT BEDTIME, Disp: 30 tablet, Rfl: 1 .  fluticasone (FLONASE) 50 MCG/ACT nasal spray, PLACE 2 SPRAYS INTO BOTH NOSTRILS DAILY., Disp: 16 g, Rfl: 12 .  gabapentin (NEURONTIN) 100 MG capsule, Take 1 capsule (100 mg total) by mouth 2 (two) times daily., Disp: 60 capsule, Rfl: 12 .  levothyroxine (SYNTHROID) 100 MCG tablet, Take 1 tablet (100 mcg total) by mouth daily., Disp: 30 tablet, Rfl: 5 .  loratadine (CLARITIN) 10 MG tablet, TAKE 1 TABLET (10 MG TOTAL) BY MOUTH DAILY., Disp: 30 tablet, Rfl: 11 .  Multiple Vitamin (MULTIVITAMIN) LIQD, Take 5 mLs by mouth daily., Disp: , Rfl:  .  ondansetron (ZOFRAN) 8 MG tablet, Take 1 tablet (8 mg total) by mouth every 8 (eight) hours as needed for nausea or vomiting., Disp: 60 tablet, Rfl: 0 .  pantoprazole (PROTONIX) 40 MG tablet, Take 1 tablet (40 mg total) by mouth daily., Disp: 90 tablet, Rfl: 3 .  ranitidine (ZANTAC) 150 MG tablet, Take 150 mg by mouth 2 (two) times daily., Disp: , Rfl:  .  sucralfate (CARAFATE) 1 g tablet, Take 1 tablet (1 g total) by  mouth 4 (four) times daily., Disp: 120 tablet, Rfl: 11 .  trimethoprim (TRIMPEX) 100 MG tablet, Take 100 mg by mouth daily., Disp: , Rfl:  .  vitamin B-12 (CYANOCOBALAMIN) 1000 MCG tablet, Take 1,000 mcg by mouth daily., Disp: , Rfl:  .  Cimetidine (TAGAMET PO), Take by mouth. (Patient not taking: Reported on 07/27/2020), Disp: , Rfl:  .  conjugated estrogens (PREMARIN) vaginal cream, Place 1 Applicatorful vaginally daily. Apply one dab topically to urethral opening daily at bedtime., Disp: 30 g, Rfl: 6 .  nitrofurantoin (MACRODANTIN) 50 MG capsule, TAKE 1 CAPSULE (50 MG TOTAL) BY MOUTH DAILY. (Patient not taking: Reported on 06/28/2019), Disp: 30 capsule, Rfl: 2 .  oxybutynin (DITROPAN XL) 10 MG 24 hr tablet, Take 1  tablet (10 mg total) by mouth at bedtime. (Patient not taking: Reported on 01/27/2020), Disp: 30 tablet, Rfl: 11   Patient Care Team: Jerrol Banana., MD as PCP - General (Family Medicine) Bary Castilla, Forest Gleason, MD (General Surgery)  Review of Systems  Musculoskeletal: Positive for back pain.  Allergic/Immunologic: Positive for environmental allergies.  All other systems reviewed and are negative.       Objective:    Vitals: BP 121/87 (BP Location: Left Arm, Patient Position: Sitting, Cuff Size: Large)   Pulse 73   Temp 98.5 F (36.9 C) (Oral)   Resp 16   Ht 5\' 6"  (1.676 m)   Wt 256 lb (116.1 kg)   LMP 11/28/1990   SpO2 97%   BMI 41.32 kg/m   Hearing Screening   125Hz  250Hz  500Hz  1000Hz  2000Hz  3000Hz  4000Hz  6000Hz  8000Hz   Right ear:           Left ear:             Visual Acuity Screening   Right eye Left eye Both eyes  Without correction:     With correction: 20/25 20/20 20/25       Hearing Screening   125Hz  250Hz  500Hz  1000Hz  2000Hz  3000Hz  4000Hz  6000Hz  8000Hz   Right ear:           Left ear:             Visual Acuity Screening   Right eye Left eye Both eyes  Without correction:     With correction: 20/25 20/20 20/25      Physical Exam Vitals  reviewed.  Constitutional:      Appearance: She is well-developed. She is obese.  HENT:     Head: Normocephalic and atraumatic.     Right Ear: External ear normal.     Left Ear: External ear normal.     Nose: Nose normal.  Eyes:     General: No scleral icterus.    Conjunctiva/sclera: Conjunctivae normal.     Pupils: Pupils are equal, round, and reactive to light.  Cardiovascular:     Rate and Rhythm: Normal rate and regular rhythm.     Heart sounds: Normal heart sounds.  Pulmonary:     Effort: Pulmonary effort is normal.     Breath sounds: Normal breath sounds.  Chest:     Breasts:        Right: Normal.        Left: Normal.  Abdominal:     General: Bowel sounds are normal.     Palpations: Abdomen is soft.     Tenderness: There is no abdominal tenderness.  Musculoskeletal:        General: Normal range of motion.     Cervical back: Normal range of motion and neck supple.     Comments: Mild tenderness over right 4th and 5th metatarsal pad.  Skin:    General: Skin is warm and dry.  Neurological:     General: No focal deficit present.     Mental Status: She is alert and oriented to person, place, and time. Mental status is at baseline.     Deep Tendon Reflexes: Reflexes are normal and symmetric.  Psychiatric:        Mood and Affect: Mood normal.        Behavior: Behavior normal.        Thought Content: Thought content normal.        Judgment: Judgment normal.     General Appearance:    Obese  female. Alert, cooperative, in no acute distress, appears stated age   Head:    Normocephalic, without obvious abnormality, atraumatic  Eyes:    PERRL, conjunctiva/corneas clear, EOM's intact, fundi    benign, both eyes  Ears:    Normal TM's and external ear canals, both ears  Nose:   Nares normal, septum midline, mucosa normal, no drainage    or sinus tenderness  Throat:   Lips, mucosa, and tongue normal; teeth and gums normal  Neck:   Supple, symmetrical, trachea midline, no  adenopathy;    thyroid:  no enlargement/tenderness/nodules; no carotid   bruit or JVD  Back:     Symmetric, no curvature, ROM normal, no CVA tenderness  Lungs:     Clear to auscultation bilaterally, respirations unlabored  Chest Wall:    No tenderness or deformity   Heart:    Normal heart rate. Normal rhythm. No murmurs, rubs, or gallops.   Breast Exam:    Normal to palpation without dominant masses, deferred  Abdomen:     Soft, non-tender, bowel sounds active all four quadrants,    no masses, no organomegaly  Pelvic:    deferred  Extremities:   All extremities are intact. No cyanosis or edema  Pulses:   2+ and symmetric all extremities  Skin:   Skin color, texture, turgor normal, no rashes or lesions  Lymph nodes:   Cervical, supraclavicular, and axillary nodes normal  Neurologic:  Nonfocal  ECG reveals normal sinus rhythm with poor R wave progression.  No ST-T wave changes  Activities of Daily Living In your present state of health, do you have any difficulty performing the following activities: 07/27/2020  Hearing? N  Vision? N  Difficulty concentrating or making decisions? N  Walking or climbing stairs? N  Dressing or bathing? N  Doing errands, shopping? N  Some recent data might be hidden    Fall Risk Assessment Fall Risk  07/27/2020 03/25/2019 04/10/2017  Falls in the past year? 1 1 Yes  Number falls in past yr: 1 0 2 or more  Injury with Fall? 0 1 No  Follow up Falls evaluation completed - Falls evaluation completed;Falls prevention discussed     Depression Screen PHQ 2/9 Scores 07/27/2020 04/30/2019 03/25/2019 12/24/2018  PHQ - 2 Score 0 0 0 0  PHQ- 9 Score 0 - - -    No flowsheet data found.    Assessment & Plan:    Initial Preventative Physical Exam  Reviewed patient's Family Medical History Reviewed and updated list of patient's medical providers Assessment of cognitive impairment was done Assessed patient's functional ability Established a written schedule for  health screening [place] Completed and Reviewed  Exercise Activities and Dietary recommendations Goals   None     Immunization History  Administered Date(s) Administered  . Influenza,inj,Quad PF,6+ Mos 12/24/2018  . PFIZER SARS-COV-2 Vaccination 04/22/2020, 05/16/2020  . Pneumococcal Polysaccharide-23 07/27/2020  . Td 09/25/2004  . Tdap 04/10/2017    Health Maintenance  Topic Date Due  . HIV Screening  Never done  . PAP SMEAR-Modifier  08/03/2011  . DEXA SCAN  Never done  . INFLUENZA VACCINE  07/16/2020  . PNA vac Low Risk Adult (2 of 2 - PCV13) 07/27/2021  . MAMMOGRAM  08/05/2021  . COLONOSCOPY  01/15/2027  . TETANUS/TDAP  04/11/2027  . COVID-19 Vaccine  Completed  . Hepatitis C Screening  Completed     Discussed health benefits of physical activity, and encouraged her to engage in  regular exercise appropriate for her age and condition.   1. Welcome to Medicare preventive visit Follow-up 3 months. - EKG 12-Lead  2. Atrophic urethritis Dab of estrogen cream around the urethral opening at bedtime. - conjugated estrogens (PREMARIN) vaginal cream; Place 1 Applicatorful vaginally daily. Apply one dab topically to urethral opening daily at bedtime.  Dispense: 30 g; Refill: 6  3. Need for pneumococcal vaccine  - Pneumococcal polysaccharide vaccine 23-valent greater than or equal to 2yo subcutaneous/IM    Follow up in 3 months.  Altin Sease Cranford Mon, MD  [facility] [phone] (phone) [phone] (fax)  [place]

## 2020-08-04 ENCOUNTER — Other Ambulatory Visit: Payer: Self-pay | Admitting: *Deleted

## 2020-08-04 DIAGNOSIS — N342 Other urethritis: Secondary | ICD-10-CM

## 2020-08-04 MED ORDER — PREMARIN 0.625 MG/GM VA CREA: TOPICAL_CREAM | VAGINAL | 6 refills | Status: DC

## 2020-08-14 ENCOUNTER — Other Ambulatory Visit: Payer: Self-pay | Admitting: Family Medicine

## 2020-08-14 DIAGNOSIS — M461 Sacroiliitis, not elsewhere classified: Secondary | ICD-10-CM

## 2020-08-14 NOTE — Telephone Encounter (Signed)
Requested medication (s) are due for refill today: yes  Requested medication (s) are on the active medication list: yes   Last refill:  07/16/2020  Future visit scheduled: yes   Notes to clinic:  this refill cannot be delegated    Requested Prescriptions  Pending Prescriptions Disp Refills   cyclobenzaprine (FLEXERIL) 5 MG tablet [Pharmacy Med Name: CYCLOBENZAPRINE 5MG  TABLETS] 30 tablet 1    Sig: TAKE 1 TABLET BY MOUTH EVERY NIGHT AT BEDTIME      Not Delegated - Analgesics:  Muscle Relaxants Failed - 08/14/2020  3:18 AM      Failed - This refill cannot be delegated      Passed - Valid encounter within last 6 months    Recent Outpatient Visits           2 weeks ago Welcome to [employer] preventive visit   [facility] Jerrol Banana., MD   6 months ago Adult hypothyroidism   [facility] Jerrol Banana., MD   1 year ago Adult hypothyroidism   [facility] Jerrol Banana., MD   1 year ago [place], Vickki Muff, [place]   1 year ago [place], [place]       Future Appointments             In 2 months Jerrol Banana., MD [facility], [place]

## 2020-08-21 IMAGING — CR DG TOE GREAT 2+V*L*
1 series · 3 of 3 positions shown · non-contrast
Comparison: Left 2nd toe radiographs obtained at the same time.

CLINICAL DATA: Left 1st and 2nd toe pain and bruising after a or
stepped on her foot yesterday.

EXAM:
LEFT GREAT TOE

[Series 1: dg toe great left · 0.14mm/px · 3 of 3 slices shown]
[im 1/3]
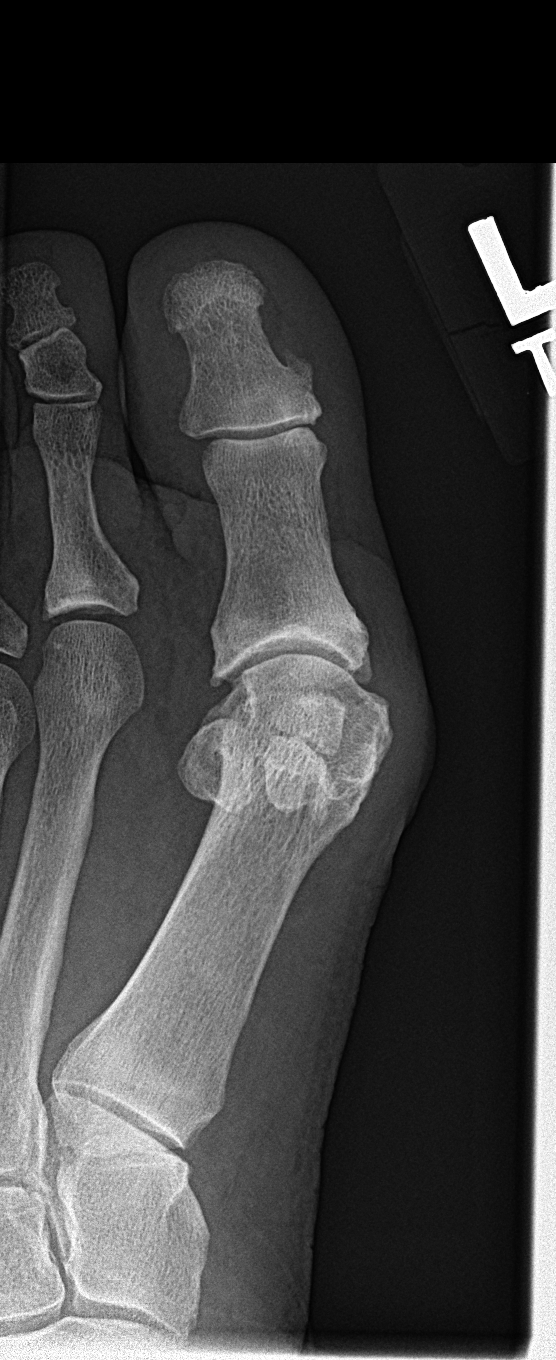
[im 2/3]
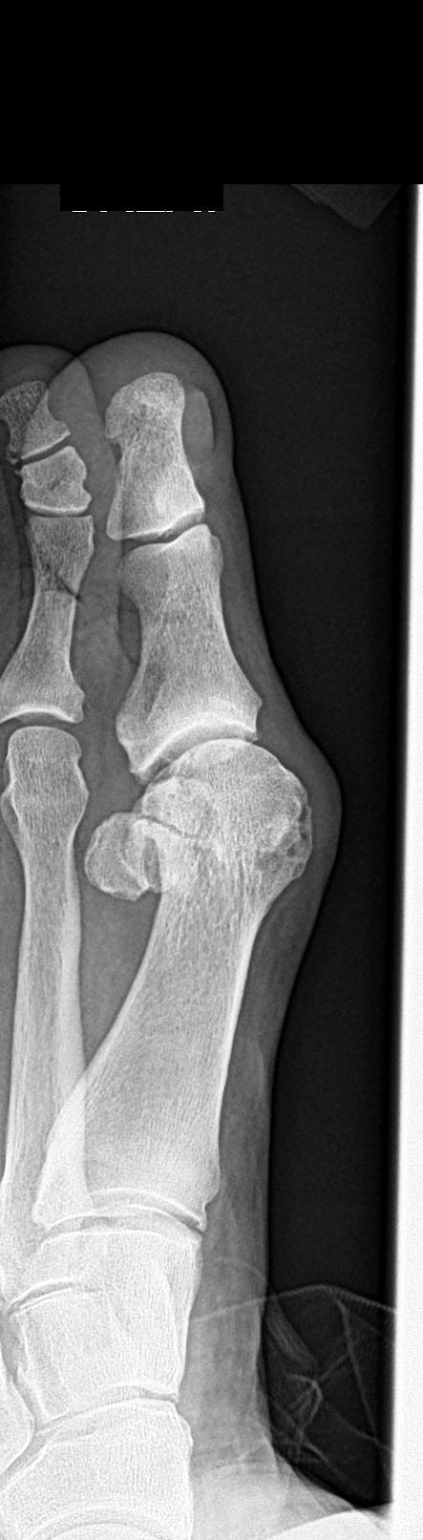
[im 3/3]
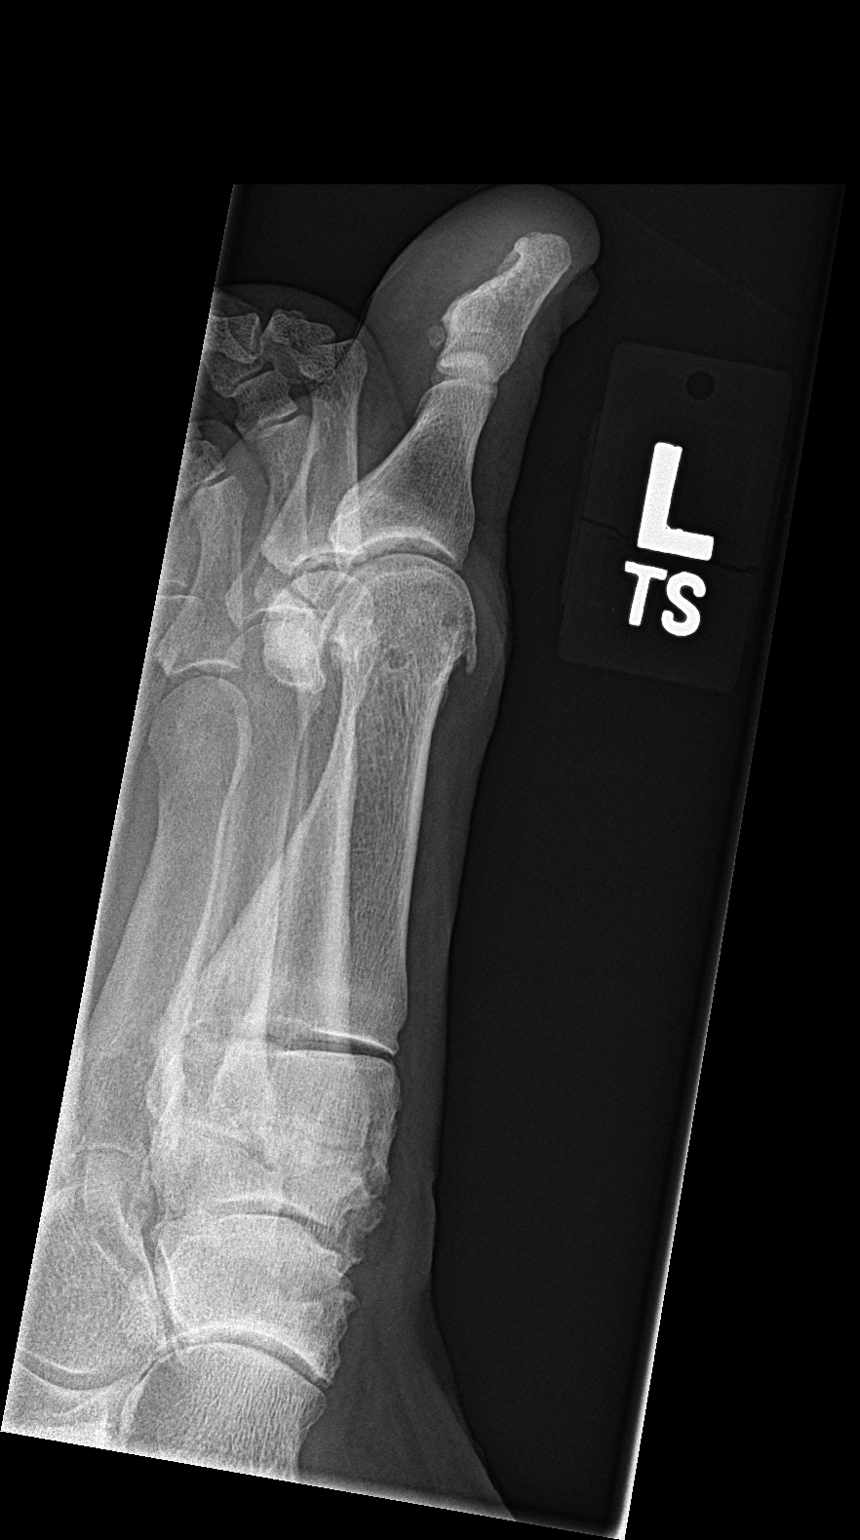

[3 of 3 positions shown; findings below may reference images not displayed]

FINDINGS: Minimal 1st IP joint degenerative spur formation and mild to
moderate 1st MTP joint degenerative spur formation. Previously
described fracture of the base of the 2nd distal phalanx. No
additional fractures or dislocations seen.
IMPRESSION: 1. Previously described fracture of the base of the 2nd distal
phalanx.
2. Degenerative changes, as described above.

## 2020-09-11 ENCOUNTER — Other Ambulatory Visit: Payer: Self-pay | Admitting: Family Medicine

## 2020-09-11 DIAGNOSIS — K219 Gastro-esophageal reflux disease without esophagitis: Secondary | ICD-10-CM

## 2020-09-11 DIAGNOSIS — K259 Gastric ulcer, unspecified as acute or chronic, without hemorrhage or perforation: Secondary | ICD-10-CM

## 2020-10-16 ENCOUNTER — Other Ambulatory Visit: Payer: Self-pay | Admitting: Family Medicine

## 2020-10-16 DIAGNOSIS — M461 Sacroiliitis, not elsewhere classified: Secondary | ICD-10-CM

## 2020-10-16 NOTE — Telephone Encounter (Signed)
Requested medication (s) are due for refill today: yes  Requested medication (s) are on the active medication list: yes  Last refill: 08/14/20  #30  1 refill  Future visit scheduled yes 10/31/20  Notes to clinic:  not delegated  Requested Prescriptions  Pending Prescriptions Disp Refills   cyclobenzaprine (FLEXERIL) 5 MG tablet [Pharmacy Med Name: CYCLOBENZAPRINE 5MG  TABLETS] 30 tablet 1    Sig: TAKE 1 TABLET BY MOUTH EVERY NIGHT AT BEDTIME      Not Delegated - Analgesics:  Muscle Relaxants Failed - 10/16/2020  3:21 AM      Failed - This refill cannot be delegated      Passed - Valid encounter within last 6 months    Recent Outpatient Visits           2 months ago [facility] to [employer] preventive visit   [facility] Jerrol Banana., MD   8 months ago Adult hypothyroidism   [facility] Jerrol Banana., MD   1 year ago Adult hypothyroidism   [facility] Jerrol Banana., MD   1 year ago [place], Vickki Muff, [place]   1 year ago [place], [place]       Future Appointments             In 2 weeks Jerrol Banana., MD [facility], [place]

## 2020-10-25 ENCOUNTER — Other Ambulatory Visit: Payer: Self-pay | Admitting: General Surgery

## 2020-10-25 DIAGNOSIS — N63 Unspecified lump in unspecified breast: Secondary | ICD-10-CM

## 2020-10-31 ENCOUNTER — Encounter: Payer: Self-pay | Admitting: Family Medicine

## 2020-10-31 ENCOUNTER — Ambulatory Visit (INDEPENDENT_AMBULATORY_CARE_PROVIDER_SITE_OTHER): Payer: Medicare Other | Admitting: Family Medicine

## 2020-10-31 VITALS — BP 148/91 | HR 82 | Temp 98.1°F | Resp 16 | Ht 66.0 in | Wt 240.0 lb

## 2020-10-31 DIAGNOSIS — I10 Essential (primary) hypertension: Secondary | ICD-10-CM | POA: Diagnosis not present

## 2020-10-31 DIAGNOSIS — K219 Gastro-esophageal reflux disease without esophagitis: Secondary | ICD-10-CM | POA: Diagnosis not present

## 2020-10-31 DIAGNOSIS — Z23 Encounter for immunization: Secondary | ICD-10-CM | POA: Diagnosis not present

## 2020-10-31 DIAGNOSIS — E039 Hypothyroidism, unspecified: Secondary | ICD-10-CM

## 2020-10-31 DIAGNOSIS — E6609 Other obesity due to excess calories: Secondary | ICD-10-CM

## 2020-10-31 DIAGNOSIS — E7800 Pure hypercholesterolemia, unspecified: Secondary | ICD-10-CM | POA: Diagnosis not present

## 2020-10-31 DIAGNOSIS — Z6838 Body mass index (BMI) 38.0-38.9, adult: Secondary | ICD-10-CM

## 2020-10-31 DIAGNOSIS — N342 Other urethritis: Secondary | ICD-10-CM

## 2020-10-31 MED ORDER — SUCRALFATE 1 G PO TABS: 1.0000 g | ORAL_TABLET | Freq: Four times a day (QID) | ORAL | 11 refills | Status: DC

## 2020-10-31 MED ORDER — PREMARIN 0.625 MG/GM VA CREA: TOPICAL_CREAM | VAGINAL | 6 refills | Status: DC

## 2020-10-31 NOTE — Progress Notes (Signed)
I,Kathy Mcintosh,acting as a scribe for Kathy Durie, MD.,have documented all relevant documentation on the behalf of Kathy Durie, MD,as directed by  Kathy Durie, MD while in the presence of Kathy Durie, MD.   Established patient visit   Patient: Kathy Mcintosh   DOB: 1956-08-24   64 y.o. Female  MRN: [id number] Visit Date: 10/31/2020  Today's healthcare provider: Wilhemena Durie, MD   Chief Complaint  Patient presents with  . Follow-up  . Hypothyroidism   Subjective    HPI  Patient retired as of August 16, 2020.  He states her blood pressures are good at home.  She feels well.  She has had Covid vaccines. Pressures are 120s to 130s over 70s to 80s. Hypothyroid, follow-up  Lab Results  Component Value Date   TSH 2.460 01/27/2020   TSH 2.100 06/28/2019   TSH 3.200 12/24/2018   Wt Readings from Last 3 Encounters:  10/31/20 240 lb (108.9 kg)  07/27/20 256 lb (116.1 kg)  01/27/20 257 lb (116.6 kg)    She was last seen for hypothyroid 9 months ago.  Management since that visit includes; on levothyroxine 100 mcg. She reports good compliance with treatment. She is not having side effects. none --------------------------------------------------------------------  Atrophic urethritis From 07/27/2020-Dab of estrogen cream around the urethral opening at bedtime. Doing well.       Medications: Outpatient Medications Prior to Visit  Medication Sig  . B COMPLEX VITAMINS PO Take by mouth.  . cholecalciferol (VITAMIN D) 1000 UNITS tablet Take 1,000 Units by mouth daily.  . cyclobenzaprine (FLEXERIL) 5 MG tablet TAKE 1 TABLET BY MOUTH EVERY NIGHT AT BEDTIME  . fluticasone (FLONASE) 50 MCG/ACT nasal spray PLACE 2 SPRAYS INTO BOTH NOSTRILS DAILY.  Marland Kitchen gabapentin (NEURONTIN) 100 MG capsule Take 1 capsule (100 mg total) by mouth 2 (two) times daily.  Marland Kitchen levothyroxine (SYNTHROID) 100 MCG tablet Take 1 tablet (100 mcg total) by mouth daily.  Marland Kitchen  loratadine (CLARITIN) 10 MG tablet TAKE 1 TABLET (10 MG TOTAL) BY MOUTH DAILY.  . Multiple Vitamin (MULTIVITAMIN) LIQD Take 5 mLs by mouth daily.  . ondansetron (ZOFRAN) 8 MG tablet Take 1 tablet (8 mg total) by mouth every 8 (eight) hours as needed for nausea or vomiting.  . pantoprazole (PROTONIX) 40 MG tablet TAKE ONE TABLET BY MOUTH EVERY DAY  . vitamin B-12 (CYANOCOBALAMIN) 1000 MCG tablet Take 1,000 mcg by mouth daily.  . [DISCONTINUED] conjugated estrogens (PREMARIN) vaginal cream Apply one dab topically to urethral opening daily at bedtime.  . [DISCONTINUED] sucralfate (CARAFATE) 1 g tablet Take 1 tablet (1 g total) by mouth 4 (four) times daily.  . Cimetidine (TAGAMET PO) Take by mouth. (Patient not taking: Reported on 07/27/2020)  . nitrofurantoin (MACRODANTIN) 50 MG capsule TAKE 1 CAPSULE (50 MG TOTAL) BY MOUTH DAILY. (Patient not taking: Reported on 06/28/2019)  . oxybutynin (DITROPAN XL) 10 MG 24 hr tablet Take 1 tablet (10 mg total) by mouth at bedtime. (Patient not taking: Reported on 01/27/2020)  . ranitidine (ZANTAC) 150 MG tablet Take 150 mg by mouth 2 (two) times daily. (Patient not taking: Reported on 10/31/2020)  . trimethoprim (TRIMPEX) 100 MG tablet Take 100 mg by mouth daily. (Patient not taking: Reported on 10/31/2020)   No facility-administered medications prior to visit.    Review of Systems  Constitutional: Negative for appetite change, chills, fatigue and fever.  Respiratory: Negative for chest tightness and shortness of breath.   Cardiovascular: Negative for  chest pain and palpitations.  Gastrointestinal: Negative for abdominal pain, nausea and vomiting.  Neurological: Negative for dizziness and weakness.       Objective    BP (!) 148/91 (BP Location: Right Arm, Patient Position: Sitting, Cuff Size: Large)   Pulse 82   Temp 98.1 F (36.7 C) (Oral)   Resp 16   Ht 5\' 6"  (1.676 m)   Wt 240 lb (108.9 kg)   LMP 11/28/1990   SpO2 98%   BMI 38.74 kg/m  BP  Readings from Last 3 Encounters:  10/31/20 (!) 148/91  07/27/20 121/87  01/27/20 129/86   Wt Readings from Last 3 Encounters:  10/31/20 240 lb (108.9 kg)  07/27/20 256 lb (116.1 kg)  01/27/20 257 lb (116.6 kg)      Physical Exam Vitals reviewed.  Constitutional:      Appearance: She is well-developed.  HENT:     Head: Normocephalic and atraumatic.     Right Ear: External ear normal.     Left Ear: External ear normal.     Nose: Nose normal.  Eyes:     General: No scleral icterus.    Conjunctiva/sclera: Conjunctivae normal.     Pupils: Pupils are equal, round, and reactive to light.  Cardiovascular:     Rate and Rhythm: Normal rate and regular rhythm.     Heart sounds: Normal heart sounds.  Pulmonary:     Effort: Pulmonary effort is normal.     Breath sounds: Normal breath sounds.  Abdominal:     General: Bowel sounds are normal.     Palpations: Abdomen is soft.     Tenderness: There is no abdominal tenderness.  Musculoskeletal:        General: Normal range of motion.     Cervical back: Normal range of motion and neck supple.  Skin:    General: Skin is warm and dry.  Neurological:     General: No focal deficit present.     Mental Status: She is alert and oriented to person, place, and time. Mental status is at baseline.     Deep Tendon Reflexes: Reflexes are normal and symmetric.  Psychiatric:        Mood and Affect: Mood normal.        Behavior: Behavior normal.        Thought Content: Thought content normal.        Judgment: Judgment normal.       No results found for any visits on 10/31/20.  Assessment & Plan     1. Adult hypothyroidism  - Lipid panel - TSH - CBC w/Diff/Platelet - Comprehensive Metabolic Panel (CMET)  2. Essential hypertension Some whitecoat hypertension.  Bring in blood pressure cuff from home. - Lipid panel - TSH - CBC w/Diff/Platelet - Comprehensive Metabolic Panel (CMET)  3. Hypercholesteremia  - Lipid panel - TSH - CBC  w/Diff/Platelet - Comprehensive Metabolic Panel (CMET)  4. Gastroesophageal reflux disease without esophagitis  - sucralfate (CARAFATE) 1 g tablet; Take 1 tablet (1 g total) by mouth 4 (four) times daily.  Dispense: 120 tablet; Refill: 11  5. Atrophic urethritis  - conjugated estrogens (PREMARIN) vaginal cream; Apply one dab topically to urethral opening daily at bedtime.  Dispense: 30 g; Refill: 6  6. Need for influenza vaccination  - Flu Vaccine QUAD High Dose(Fluad) 7.  Morbid obesity Diet and exercise stressed now that she is retired.`  Return in about 9 months (around 07/31/2021) for CPE.  Analeese Andreatta Cranford Mon, MD  [facility] [phone] (phone) [phone] (fax)  [place]

## 2020-11-07 ENCOUNTER — Telehealth: Payer: Self-pay

## 2020-11-07 MED ORDER — ESTRADIOL 0.1 MG/GM VA CREA: 1.0000 | TOPICAL_CREAM | Freq: Every day | VAGINAL | 12 refills | Status: AC

## 2020-11-07 NOTE — Telephone Encounter (Signed)
Yes I will change to estrace cream instead and send to [facility]. It looks preferred for her insurance.  We can change to HT if needed.

## 2020-11-07 NOTE — Telephone Encounter (Signed)
Patient stopped by to say the Premarin Cream was $400+

## 2020-11-07 NOTE — Telephone Encounter (Signed)
I began the message and accidentally closed it before finishing.  Anyway, the Premarin cream is $400+ a month.   She said she cant afford that.    If there is something else you can call in like Estrace, it has a generic and can use the Good Rx and get it around $35.00 at [facility]( I asked the pharmacist about this-Coti Burd)

## 2020-11-07 NOTE — Telephone Encounter (Signed)
Please advise 

## 2020-11-07 NOTE — Telephone Encounter (Signed)
Left detailed message on pt's vm

## 2020-11-08 LAB — CBC WITH DIFFERENTIAL/PLATELET
Basophils Absolute: 0.1 10*3/uL (ref 0.0–0.2)
Basos: 1 %
EOS (ABSOLUTE): 0.1 10*3/uL (ref 0.0–0.4)
Eos: 2 %
Hematocrit: 41.5 % (ref 34.0–46.6)
Hemoglobin: 14.2 g/dL (ref 11.1–15.9)
Immature Grans (Abs): 0 10*3/uL (ref 0.0–0.1)
Immature Granulocytes: 0 %
Lymphocytes Absolute: 1.6 10*3/uL (ref 0.7–3.1)
Lymphs: 23 %
MCH: 30.7 pg (ref 26.6–33.0)
MCHC: 34.2 g/dL (ref 31.5–35.7)
MCV: 90 fL (ref 79–97)
Monocytes Absolute: 0.5 10*3/uL (ref 0.1–0.9)
Monocytes: 8 %
Neutrophils Absolute: 4.8 10*3/uL (ref 1.4–7.0)
Neutrophils: 66 %
Platelets: 259 10*3/uL (ref 150–450)
RBC: 4.62 x10E6/uL (ref 3.77–5.28)
RDW: 13.1 % (ref 11.7–15.4)
WBC: 7.1 10*3/uL (ref 3.4–10.8)

## 2020-11-08 LAB — COMPREHENSIVE METABOLIC PANEL
ALT: 15 IU/L (ref 0–32)
AST: 19 IU/L (ref 0–40)
Albumin/Globulin Ratio: 2 (ref 1.2–2.2)
Albumin: 4.3 g/dL (ref 3.8–4.8)
Alkaline Phosphatase: 152 IU/L — ABNORMAL HIGH (ref 44–121)
BUN/Creatinine Ratio: 12 (ref 12–28)
BUN: 7 mg/dL — ABNORMAL LOW (ref 8–27)
Bilirubin Total: 0.5 mg/dL (ref 0.0–1.2)
CO2: 23 mmol/L (ref 20–29)
Calcium: 9.4 mg/dL (ref 8.7–10.3)
Chloride: 106 mmol/L (ref 96–106)
Creatinine, Ser: 0.57 mg/dL (ref 0.57–1.00)
GFR calc Af Amer: 113 mL/min/{1.73_m2} (ref >59)
GFR calc non Af Amer: 98 mL/min/{1.73_m2} (ref >59)
Globulin, Total: 2.2 g/dL (ref 1.5–4.5)
Glucose: 102 mg/dL — ABNORMAL HIGH (ref 65–99)
Potassium: 3.7 mmol/L (ref 3.5–5.2)
Sodium: 144 mmol/L (ref 134–144)
Total Protein: 6.5 g/dL (ref 6.0–8.5)

## 2020-11-08 LAB — LIPID PANEL
Chol/HDL Ratio: 2.9 ratio (ref 0.0–4.4)
Cholesterol, Total: 167 mg/dL (ref 100–199)
HDL: 57 mg/dL (ref >39)
LDL Chol Calc (NIH): 92 mg/dL (ref 0–99)
Triglycerides: 96 mg/dL (ref 0–149)
VLDL Cholesterol Cal: 18 mg/dL (ref 5–40)

## 2020-11-08 LAB — TSH: TSH: 2.09 u[IU]/mL (ref 0.450–4.500)

## 2020-11-13 ENCOUNTER — Other Ambulatory Visit: Payer: Self-pay | Admitting: Family Medicine

## 2020-11-13 DIAGNOSIS — E039 Hypothyroidism, unspecified: Secondary | ICD-10-CM

## 2020-11-14 ENCOUNTER — Ambulatory Visit
Admission: RE | Admit: 2020-11-14 | Discharge: 2020-11-14 | Disposition: A | Discharge status: Home / Self Care | Payer: Medicare Other | Source: Ambulatory Visit | Attending: General Surgery | Admitting: General Surgery

## 2020-11-14 ENCOUNTER — Other Ambulatory Visit: Payer: Self-pay

## 2020-11-14 DIAGNOSIS — N6310 Unspecified lump in the right breast, unspecified quadrant: Secondary | ICD-10-CM | POA: Diagnosis present

## 2020-11-14 DIAGNOSIS — N63 Unspecified lump in unspecified breast: Secondary | ICD-10-CM

## 2020-11-14 DIAGNOSIS — N6315 Unspecified lump in the right breast, overlapping quadrants: Secondary | ICD-10-CM | POA: Diagnosis not present

## 2020-12-17 ENCOUNTER — Other Ambulatory Visit: Payer: Self-pay | Admitting: Family Medicine

## 2020-12-17 DIAGNOSIS — M461 Sacroiliitis, not elsewhere classified: Secondary | ICD-10-CM

## 2021-01-08 ENCOUNTER — Telehealth: Payer: Self-pay | Admitting: Family Medicine

## 2021-01-08 NOTE — Telephone Encounter (Addendum)
Pt is calling to let dr Rosanna Randy know she took in home covid test and tested positive. Pt had fever yesterday and she is ok today. Pt does not want to do virtual appt. Pt just feels unwell. Pt will treat symptoms at home

## 2021-01-21 ENCOUNTER — Other Ambulatory Visit: Payer: Self-pay | Admitting: Family Medicine

## 2021-03-22 ENCOUNTER — Other Ambulatory Visit: Payer: Self-pay | Admitting: Family Medicine

## 2021-03-22 DIAGNOSIS — K219 Gastro-esophageal reflux disease without esophagitis: Secondary | ICD-10-CM

## 2021-03-22 DIAGNOSIS — K259 Gastric ulcer, unspecified as acute or chronic, without hemorrhage or perforation: Secondary | ICD-10-CM

## 2021-05-16 ENCOUNTER — Other Ambulatory Visit: Payer: Self-pay | Admitting: Family Medicine

## 2021-05-16 DIAGNOSIS — M461 Sacroiliitis, not elsewhere classified: Secondary | ICD-10-CM

## 2021-05-16 NOTE — Telephone Encounter (Signed)
Requested medications are due for refill today.  yes  Requested medications are on the active medications list.  yes  Last refill. 12/18/2020  Future visit scheduled.   yes  Notes to clinic.  Medication not delegated.

## 2021-07-09 ENCOUNTER — Ambulatory Visit: Payer: Self-pay | Admitting: *Deleted

## 2021-07-09 NOTE — Telephone Encounter (Signed)
Reason for Disposition  Heart rhythm alert (e.g., "you have irregular heartbeat") from personal wearable device (e.g., Apple Watch)  Answer Assessment - Initial Assessment Questions 1. DESCRIPTION: "Please describe your heart rate or heartbeat that you are having" (e.g., fast/slow, regular/irregular, skipped or extra beats, "palpitations")    Slow/ fast , heart rate slow at times and feels dizziness and short of breath 2. ONSET: "When did it start?" (Minutes, hours or days)      "For a while since April " 3. DURATION: "How long does it last" (e.g., seconds, minutes, hours)     A few seconds  4. PATTERN "Does it come and go, or has it been constant since it started?"  "Does it get worse with exertion?"   "Are you feeling it now?"     Comes and goes now feeling now  5. TAP: "Using your hand, can you tap out what you are feeling on a chair or table in front of you, so that I can hear?" (Note: not all patients can do this)       na 6. HEART RATE: "Can you tell me your heart rate?" "How many beats in 15 seconds?"  (Note: not all patients can do this)       78 7. RECURRENT SYMPTOM: "Have you ever had this before?" If Yes, ask: "When was the last time?" and "What happened that time?"      Yes . Last week Thursday  8. CAUSE: "What do you think is causing the palpitations?"     Not sure  9. CARDIAC HISTORY: "Do you have any history of heart disease?" (e.g., heart attack, angina, bypass surgery, angioplasty, arrhythmia)      No . Family hx Afib 10. OTHER SYMPTOMS: "Do you have any other symptoms?" (e.g., dizziness, chest pain, sweating, difficulty breathing)       Dizziness, shortness of breath last week , elevated B/P 11. PREGNANCY: "Is there any chance you are pregnant?" "When was your last menstrual period?"       na  Protocols used: Heart Rate and Heartbeat Questions-A-AH

## 2021-07-09 NOTE — Telephone Encounter (Signed)
C/o Apple watch information reporting heart rate this am as 46 , no symptoms. Reports feeling dizziness, shortness of breath at times with heart rate palpitations. Denies feeling symptoms today. Apple watch results 78 bpm now. Irregularities in heart rate noted since April. Reports b/p elevated today. At rest B/P 179/106 sitting and standing B/P 155/116, Hr 82 bpm from b/p monitor and  Apple watch HR 80 bpm. Reports headache this am but not now. Appt scheduled with PCP 07/12/21. Please advise if patient is to go to ED for B/P. Patient would like to hear from PCP due to no symptoms. Care advise given. Patient verbalized understanding of care advise and to call back or go to [facility] or ED if symptoms worsen.

## 2021-07-12 ENCOUNTER — Encounter: Payer: Self-pay | Admitting: Family Medicine

## 2021-07-12 ENCOUNTER — Ambulatory Visit (INDEPENDENT_AMBULATORY_CARE_PROVIDER_SITE_OTHER): Payer: Medicare Other | Admitting: Family Medicine

## 2021-07-12 ENCOUNTER — Other Ambulatory Visit: Payer: Self-pay

## 2021-07-12 VITALS — BP 132/83 | HR 72 | Temp 97.7°F | Resp 16 | Ht 66.0 in | Wt 218.0 lb

## 2021-07-12 DIAGNOSIS — R001 Bradycardia, unspecified: Secondary | ICD-10-CM

## 2021-07-12 DIAGNOSIS — E6609 Other obesity due to excess calories: Secondary | ICD-10-CM | POA: Diagnosis not present

## 2021-07-12 DIAGNOSIS — R000 Tachycardia, unspecified: Secondary | ICD-10-CM | POA: Diagnosis not present

## 2021-07-12 DIAGNOSIS — I10 Essential (primary) hypertension: Secondary | ICD-10-CM

## 2021-07-12 DIAGNOSIS — E039 Hypothyroidism, unspecified: Secondary | ICD-10-CM | POA: Diagnosis not present

## 2021-07-12 DIAGNOSIS — Z6838 Body mass index (BMI) 38.0-38.9, adult: Secondary | ICD-10-CM

## 2021-07-12 MED ORDER — LOSARTAN POTASSIUM 50 MG PO TABS: 50.0000 mg | ORAL_TABLET | Freq: Every day | ORAL | 2 refills | Status: DC

## 2021-07-12 NOTE — Progress Notes (Addendum)
I,April Miller,acting as a scribe for Wilhemena Durie, MD.,have documented all relevant documentation on the behalf of Wilhemena Durie, MD,as directed by  Wilhemena Durie, MD while in the presence of Wilhemena Durie, MD.   Established patient visit   Patient: Kathy Mcintosh   DOB: June 13, 1956   65 y.o. Female  MRN: [id number] Visit Date: 07/12/2021  Today's healthcare provider: Wilhemena Durie, MD   Chief Complaint  Patient presents with   Follow-up   Subjective    HPI  Patient has an apple watch and states at times she has been tachycardic and bradycardic in the last couple of weeks. No  Syncope or presyncope.  No chest pain dyspnea on exertion PND orthopnea. She states blood pressures are running occasionally [id number] systolic at home. Patient states she has lost 40 pounds intentionally over the past year. Follow up for elevated BP  The patient was last seen for this 5 months ago. Changes made at last visit include no medication changes.  She reports good compliance with treatment. She feels that condition is Worse. She is not having side effects. none  ---------------------------------------------------------------------------- Patient states her blood pressure is running around 188/108-186/100.      Medications: Outpatient Medications Prior to Visit  Medication Sig   B COMPLEX VITAMINS PO Take by mouth.   cholecalciferol (VITAMIN D) 1000 UNITS tablet Take 1,000 Units by mouth daily.   Cimetidine (TAGAMET PO) Take by mouth.   cyclobenzaprine (FLEXERIL) 5 MG tablet TAKE 1 TABLET BY MOUTH EVERY NIGHT AT BEDTIME   estradiol (ESTRACE VAGINAL) 0.1 MG/GM vaginal cream Place 1 Applicatorful vaginally at bedtime.   fluticasone (FLONASE) 50 MCG/ACT nasal spray PLACE 2 SPRAYS INTO BOTH NOSTRILS DAILY.   gabapentin (NEURONTIN) 100 MG capsule TAKE 1 CAPSULE(100 MG) BY MOUTH TWICE DAILY   levothyroxine (SYNTHROID) 100 MCG tablet TAKE 1 TABLET(100 MCG) BY MOUTH DAILY    loratadine (CLARITIN) 10 MG tablet TAKE 1 TABLET (10 MG TOTAL) BY MOUTH DAILY.   Multiple Vitamin (MULTIVITAMIN) LIQD Take 5 mLs by mouth daily.   ondansetron (ZOFRAN) 8 MG tablet Take 1 tablet (8 mg total) by mouth every 8 (eight) hours as needed for nausea or vomiting.   pantoprazole (PROTONIX) 40 MG tablet TAKE 1 TABLET BY MOUTH EVERY DAY   sucralfate (CARAFATE) 1 g tablet Take 1 tablet (1 g total) by mouth 4 (four) times daily.   vitamin B-12 (CYANOCOBALAMIN) 1000 MCG tablet Take 1,000 mcg by mouth daily.   [DISCONTINUED] nitrofurantoin (MACRODANTIN) 50 MG capsule TAKE 1 CAPSULE (50 MG TOTAL) BY MOUTH DAILY. (Patient not taking: Reported on 06/28/2019)   [DISCONTINUED] oxybutynin (DITROPAN XL) 10 MG 24 hr tablet Take 1 tablet (10 mg total) by mouth at bedtime. (Patient not taking: Reported on 01/27/2020)   [DISCONTINUED] ranitidine (ZANTAC) 150 MG tablet Take 150 mg by mouth 2 (two) times daily. (Patient not taking: No sig reported)   [DISCONTINUED] trimethoprim (TRIMPEX) 100 MG tablet Take 100 mg by mouth daily. (Patient not taking: Reported on 10/31/2020)   No facility-administered medications prior to visit.    Review of Systems      Objective    BP 132/83 (BP Location: Right Arm, Patient Position: Sitting, Cuff Size: Large)   Pulse 72   Temp 97.7 F (36.5 C) (Temporal)   Resp 16   Ht '5\' 6"'$  (1.676 m)   Wt 218 lb (98.9 kg)   LMP 11/28/1990   SpO2 97%   BMI 35.19 kg/m  BP Readings from Last 3 Encounters:  07/12/21 132/83  10/31/20 (!) 148/91  07/27/20 121/87   Wt Readings from Last 3 Encounters:  07/12/21 218 lb (98.9 kg)  10/31/20 240 lb (108.9 kg)  07/27/20 256 lb (116.1 kg)       Physical Exam Vitals reviewed.  Constitutional:      Appearance: She is well-developed.  HENT:     Head: Normocephalic and atraumatic.     Right Ear: External ear normal.     Left Ear: External ear normal.     Nose: Nose normal.  Eyes:     General: No scleral icterus.     Conjunctiva/sclera: Conjunctivae normal.     Pupils: Pupils are equal, round, and reactive to light.  Cardiovascular:     Rate and Rhythm: Normal rate and regular rhythm.     Heart sounds: Normal heart sounds.  Pulmonary:     Effort: Pulmonary effort is normal.     Breath sounds: Normal breath sounds.  Abdominal:     General: Bowel sounds are normal.     Palpations: Abdomen is soft.     Tenderness: There is no abdominal tenderness.  Musculoskeletal:        General: Normal range of motion.     Cervical back: Normal range of motion and neck supple.  Skin:    General: Skin is warm and dry.  Neurological:     General: No focal deficit present.     Mental Status: She is alert and oriented to person, place, and time. Mental status is at baseline.     Deep Tendon Reflexes: Reflexes are normal and symmetric.  Psychiatric:        Mood and Affect: Mood normal.        Behavior: Behavior normal.        Thought Content: Thought content normal.        Judgment: Judgment normal.    ECG reveals normal sinus rhythm at 72 with left anterior fascicular block and poor R wave progression.  No results found for any visits on 07/12/21.  Assessment & Plan     1. Tachycardia Her symptoms refer to cardiology for consideration of Holter monitor. - EKG 12-Lead  2. Bradycardia I think this is benign but patient needs reassurance. - CBC w/Diff/Platelet - Comprehensive Metabolic Panel (CMET) - TSH - Ambulatory referral to Cardiology  3. Adult hypothyroidism  - CBC w/Diff/Platelet - Comprehensive Metabolic Panel (CMET) - TSH 4.  Hypertension Start losartan 50 mg daily  Return in about 2 months (around 09/12/2021).      I, Wilhemena Durie, MD, have reviewed all documentation for this visit. The documentation on 07/28/21 for the exam, diagnosis, procedures, and orders are all accurate and complete.    Izola Teague Cranford Mon, MD  [facility] [phone] (phone) [phone]  (fax)  [place]

## 2021-07-18 LAB — CBC WITH DIFFERENTIAL/PLATELET
Basophils Absolute: 0.1 10*3/uL (ref 0.0–0.2)
Basos: 1 %
EOS (ABSOLUTE): 0.2 10*3/uL (ref 0.0–0.4)
Eos: 3 %
Hematocrit: 38.9 % (ref 34.0–46.6)
Hemoglobin: 13.4 g/dL (ref 11.1–15.9)
Immature Grans (Abs): 0 10*3/uL (ref 0.0–0.1)
Immature Granulocytes: 0 %
Lymphocytes Absolute: 1.9 10*3/uL (ref 0.7–3.1)
Lymphs: 28 %
MCH: 30.8 pg (ref 26.6–33.0)
MCHC: 34.4 g/dL (ref 31.5–35.7)
MCV: 89 fL (ref 79–97)
Monocytes Absolute: 0.6 10*3/uL (ref 0.1–0.9)
Monocytes: 9 %
Neutrophils Absolute: 3.9 10*3/uL (ref 1.4–7.0)
Neutrophils: 59 %
Platelets: 219 10*3/uL (ref 150–450)
RBC: 4.35 x10E6/uL (ref 3.77–5.28)
RDW: 12.9 % (ref 11.7–15.4)
WBC: 6.6 10*3/uL (ref 3.4–10.8)

## 2021-07-18 LAB — COMPREHENSIVE METABOLIC PANEL
ALT: 12 IU/L (ref 0–32)
AST: 13 IU/L (ref 0–40)
Albumin/Globulin Ratio: 2 (ref 1.2–2.2)
Albumin: 3.9 g/dL (ref 3.8–4.8)
Alkaline Phosphatase: 164 IU/L — ABNORMAL HIGH (ref 44–121)
BUN/Creatinine Ratio: 17 (ref 12–28)
BUN: 10 mg/dL (ref 8–27)
Bilirubin Total: 0.4 mg/dL (ref 0.0–1.2)
CO2: 25 mmol/L (ref 20–29)
Calcium: 9.1 mg/dL (ref 8.7–10.3)
Chloride: 106 mmol/L (ref 96–106)
Creatinine, Ser: 0.59 mg/dL (ref 0.57–1.00)
Globulin, Total: 2 g/dL (ref 1.5–4.5)
Glucose: 90 mg/dL (ref 65–99)
Potassium: 4.3 mmol/L (ref 3.5–5.2)
Sodium: 143 mmol/L (ref 134–144)
Total Protein: 5.9 g/dL — ABNORMAL LOW (ref 6.0–8.5)
eGFR: 99 mL/min/{1.73_m2} (ref >59)

## 2021-07-18 LAB — TSH: TSH: 2.06 u[IU]/mL (ref 0.450–4.500)

## 2021-07-31 ENCOUNTER — Ambulatory Visit (INDEPENDENT_AMBULATORY_CARE_PROVIDER_SITE_OTHER): Payer: Medicare Other | Admitting: Family Medicine

## 2021-07-31 ENCOUNTER — Other Ambulatory Visit: Payer: Self-pay

## 2021-07-31 ENCOUNTER — Encounter: Payer: Self-pay | Admitting: Family Medicine

## 2021-07-31 VITALS — BP 134/85 | HR 74 | Resp 16 | Ht 67.0 in | Wt 216.0 lb

## 2021-07-31 DIAGNOSIS — Z0000 Encounter for general adult medical examination without abnormal findings: Secondary | ICD-10-CM | POA: Diagnosis not present

## 2021-07-31 DIAGNOSIS — E7800 Pure hypercholesterolemia, unspecified: Secondary | ICD-10-CM

## 2021-07-31 DIAGNOSIS — E538 Deficiency of other specified B group vitamins: Secondary | ICD-10-CM

## 2021-07-31 DIAGNOSIS — E6609 Other obesity due to excess calories: Secondary | ICD-10-CM | POA: Diagnosis not present

## 2021-07-31 DIAGNOSIS — I10 Essential (primary) hypertension: Secondary | ICD-10-CM | POA: Diagnosis not present

## 2021-07-31 DIAGNOSIS — K219 Gastro-esophageal reflux disease without esophagitis: Secondary | ICD-10-CM

## 2021-07-31 DIAGNOSIS — E039 Hypothyroidism, unspecified: Secondary | ICD-10-CM

## 2021-07-31 DIAGNOSIS — N6099 Unspecified benign mammary dysplasia of unspecified breast: Secondary | ICD-10-CM

## 2021-07-31 DIAGNOSIS — Z6838 Body mass index (BMI) 38.0-38.9, adult: Secondary | ICD-10-CM

## 2021-07-31 DIAGNOSIS — Z780 Asymptomatic menopausal state: Secondary | ICD-10-CM

## 2021-07-31 DIAGNOSIS — N3281 Overactive bladder: Secondary | ICD-10-CM

## 2021-07-31 NOTE — Progress Notes (Signed)
Annual Wellness Visit     Patient: Kathy Mcintosh, Female    DOB: 07-11-1956, 65 y.o.   MRN: [id number] Visit Date: 07/31/2021  Today's Provider: Wilhemena Durie, MD   Chief Complaint  Patient presents with   Annual Exam   Subjective    Kathy Mcintosh is a 65 y.o. female who presents today for her Annual Wellness Visit. She reports consuming a general diet. The patient does not participate in regular exercise at present. She generally feels well. She reports sleeping fairly well. She does not have additional problems to discuss today.  She states her home her blood pressures were running 120s to 130 over 80s She has chronic nocturia x3-4 and is followed for well woman exam at [facility] and gets Botox injections for her bladder. For low back pain she takes Flexeril 5 mg at night      Medications: Outpatient Medications Prior to Visit  Medication Sig   B COMPLEX VITAMINS PO Take by mouth.   cholecalciferol (VITAMIN D) 1000 UNITS tablet Take 1,000 Units by mouth daily.   Cimetidine (TAGAMET PO) Take by mouth.   cyclobenzaprine (FLEXERIL) 5 MG tablet TAKE 1 TABLET BY MOUTH EVERY NIGHT AT BEDTIME   estradiol (ESTRACE VAGINAL) 0.1 MG/GM vaginal cream Place 1 Applicatorful vaginally at bedtime.   fluticasone (FLONASE) 50 MCG/ACT nasal spray PLACE 2 SPRAYS INTO BOTH NOSTRILS DAILY.   gabapentin (NEURONTIN) 100 MG capsule TAKE 1 CAPSULE(100 MG) BY MOUTH TWICE DAILY   levothyroxine (SYNTHROID) 100 MCG tablet TAKE 1 TABLET(100 MCG) BY MOUTH DAILY   loratadine (CLARITIN) 10 MG tablet TAKE 1 TABLET (10 MG TOTAL) BY MOUTH DAILY.   losartan (COZAAR) 50 MG tablet Take 1 tablet (50 mg total) by mouth daily.   Multiple Vitamin (MULTIVITAMIN) LIQD Take 5 mLs by mouth daily.   ondansetron (ZOFRAN) 8 MG tablet Take 1 tablet (8 mg total) by mouth every 8 (eight) hours as needed for nausea or vomiting.   pantoprazole (PROTONIX) 40 MG tablet TAKE 1 TABLET BY MOUTH EVERY DAY   sucralfate  (CARAFATE) 1 g tablet Take 1 tablet (1 g total) by mouth 4 (four) times daily.   vitamin B-12 (CYANOCOBALAMIN) 1000 MCG tablet Take 1,000 mcg by mouth daily.   No facility-administered medications prior to visit.    Allergies  Allergen Reactions   Sulfa Antibiotics Nausea And Vomiting    Patient Care Team: Jerrol Banana., MD as PCP - General (Family Medicine) Bary Castilla, Forest Gleason, MD (General Surgery)  Review of Systems  All other systems reviewed and are negative.       Objective    Vitals: BP 134/85   Pulse 74   Resp 16   Ht '5\' 7"'$  (1.702 m)   Wt 216 lb (98 kg)   LMP 11/28/1990   BMI 33.83 kg/m  BP Readings from Last 3 Encounters:  07/31/21 134/85  07/12/21 132/83  10/31/20 (!) 148/91   Wt Readings from Last 3 Encounters:  07/31/21 216 lb (98 kg)  07/12/21 218 lb (98.9 kg)  10/31/20 240 lb (108.9 kg)      Physical Exam Vitals reviewed.  Constitutional:      Appearance: She is well-developed. She is obese.  HENT:     Head: Normocephalic and atraumatic.     Right Ear: External ear normal.     Left Ear: External ear normal.     Nose: Nose normal.  Eyes:     General: No scleral icterus.  Conjunctiva/sclera: Conjunctivae normal.     Pupils: Pupils are equal, round, and reactive to light.  Cardiovascular:     Rate and Rhythm: Normal rate and regular rhythm.     Heart sounds: Normal heart sounds.  Pulmonary:     Effort: Pulmonary effort is normal.     Breath sounds: Normal breath sounds.  Chest:  Breasts:    Right: Normal.     Left: Normal.  Abdominal:     General: Bowel sounds are normal.     Palpations: Abdomen is soft.     Tenderness: There is no abdominal tenderness.  Musculoskeletal:        General: Normal range of motion.     Cervical back: Normal range of motion and neck supple.  Skin:    General: Skin is warm and dry.  Neurological:     General: No focal deficit present.     Mental Status: She is alert and oriented to person, place,  and time. Mental status is at baseline.     Deep Tendon Reflexes: Reflexes are normal and symmetric.  Psychiatric:        Mood and Affect: Mood normal.        Behavior: Behavior normal.        Thought Content: Thought content normal.        Judgment: Judgment normal.     Most recent functional status assessment: In your present state of health, do you have any difficulty performing the following activities: 07/31/2021  Hearing? N  Vision? N  Difficulty concentrating or making decisions? N  Walking or climbing stairs? N  Dressing or bathing? N  Doing errands, shopping? N  Some recent data might be hidden   Most recent fall risk assessment: Fall Risk  07/31/2021  Falls in the past year? 0  Number falls in past yr: 0  Injury with Fall? 0  Risk for fall due to : No Fall Risks  Follow up Falls evaluation completed    Most recent depression screenings: PHQ 2/9 Scores 07/31/2021 07/27/2020  PHQ - 2 Score 0 0  PHQ- 9 Score - 0   Most recent cognitive screening: 6CIT Screen 07/31/2021  What Year? 0 points  What month? 0 points  What time? 0 points  Count back from 20 0 points  Months in reverse 0 points  Repeat phrase 0 points  Total Score 0   Most recent Audit-C alcohol use screening Alcohol Use Disorder Test (AUDIT) 07/31/2021  1. How often do you have a drink containing alcohol? 0  2. How many drinks containing alcohol do you have on a typical day when you are drinking? 0  3. How often do you have six or more drinks on one occasion? 0  AUDIT-C Score 0  Alcohol Brief Interventions/Follow-up -   A score of 3 or more in women, and 4 or more in men indicates increased risk for alcohol abuse, EXCEPT if all of the points are from question 1   No results found for any visits on 07/31/21.  Assessment & Plan     Annual wellness visit done today including the all of the following: Reviewed patient's Family Medical History Reviewed and updated list of patient's medical  providers Assessment of cognitive impairment was done Assessed patient's functional ability Established a written schedule for health screening [place] Completed and Reviewed  Exercise Activities and Dietary recommendations  Goals   None     Immunization History  Administered Date(s) Administered  Fluad Quad(high Dose 65+) 10/31/2020   Influenza,inj,Quad PF,6+ Mos 12/24/2018   PFIZER(Purple Top)SARS-COV-2 Vaccination 04/22/2020, 05/16/2020   Pneumococcal Polysaccharide-23 07/27/2020   Td 09/25/2004   Tdap 04/10/2017    Health Maintenance  Topic Date Due   Zoster Vaccines- Shingrix (1 of 2) Never done   DEXA SCAN  Never done   COVID-19 Vaccine (3 - Booster for Pfizer series) 10/16/2020   INFLUENZA VACCINE  07/16/2021   PNA vac Low Risk Adult (2 of 2 - PCV13) 07/27/2021   MAMMOGRAM  11/14/2022   COLONOSCOPY (Pts 45-15yr Insurance coverage will need to be confirmed)  01/15/2027   TETANUS/TDAP  04/11/2027   Hepatitis C Screening  Completed   HPV VACCINES  Aged Out     Discussed health benefits of physical activity, and encouraged her to engage in regular exercise appropriate for her age and condition.    1. Medicare annual wellness visit, subsequent   2. Postmenopausal  - DG Bone Density; Future  3. Class 2 obesity due to excess calories without serious comorbidity with body mass index (BMI) of 38.0 to 38.9 in adult Had an exercise stress  4. Essential hypertension Good control with home readings being 120s to 130s  5. Adult hypothyroidism Follow-up TSH  6. Hypercholesteremia   7. Primary hypertension Losartan 50  8. Gastroesophageal reflux disease without esophagitis Pantoprazole daily  9. Detrusor muscle hypertonia [facility] 10. Atypical ductal hyperplasia, breast Yearly mammogram  11. B12 deficiency    No follow-ups on file.     I, RWilhemena Durie MD, have reviewed all documentation for this visit. The  documentation on 08/09/21 for the exam, diagnosis, procedures, and orders are all accurate and complete.    Jmichael Gille GCranford Mon MD  [phone](phone) [phone](fax)  [place]

## 2021-08-25 ENCOUNTER — Other Ambulatory Visit: Payer: Self-pay | Admitting: Family Medicine

## 2021-08-25 DIAGNOSIS — E039 Hypothyroidism, unspecified: Secondary | ICD-10-CM

## 2021-08-25 NOTE — Telephone Encounter (Signed)
Requested Prescriptions  Pending Prescriptions Disp Refills  . levothyroxine (SYNTHROID) 100 MCG tablet [Pharmacy Med Name: LEVOTHYROXINE 0.'100MG'$  (100MCG) TAB] 90 tablet 3    Sig: TAKE 1 TABLET(100 MCG) BY MOUTH DAILY     Endocrinology:  Hypothyroid Agents Failed - 08/25/2021  3:18 AM      Failed - TSH needs to be rechecked within 3 months after an abnormal result. Refill until TSH is due.      Passed - TSH in normal range and within 360 days    TSH  Date Value Ref Range Status  07/17/2021 2.060 0.450 - 4.500 uIU/mL Final         Passed - Valid encounter within last 12 months    Recent Outpatient Visits          3 weeks ago Medicare annual wellness visit, subsequent   [facility] Jerrol Banana., MD   1 month ago [place] Jerrol Banana., MD   9 months ago Adult hypothyroidism   [facility] Jerrol Banana., MD   1 year ago [facility] preventive visit   [facility] Jerrol Banana., MD   1 year ago Adult hypothyroidism   [facility] Jerrol Banana., MD      Future Appointments            In 5 months Jerrol Banana., MD [facility], [place]

## 2021-09-20 ENCOUNTER — Telehealth: Payer: Self-pay | Admitting: Family Medicine

## 2021-09-20 MED ORDER — GABAPENTIN 100 MG PO CAPS: ORAL_CAPSULE | ORAL | 12 refills | Status: DC

## 2021-09-20 NOTE — Telephone Encounter (Signed)
[facility] faxed refill request for the following medications:  gabapentin (NEURONTIN) 100 MG capsule    Please advise.

## 2021-09-25 ENCOUNTER — Other Ambulatory Visit: Payer: Self-pay | Admitting: Family Medicine

## 2021-09-25 DIAGNOSIS — M461 Sacroiliitis, not elsewhere classified: Secondary | ICD-10-CM

## 2021-09-25 NOTE — Telephone Encounter (Signed)
Requested medications are due for refill today yes  Requested medications are on the active medication list yes  Last refill 08/27/21  Last visit 07/31/21  Future visit scheduled 01/30/22  Notes to clinic med is not delegated, please assess.

## 2021-10-08 ENCOUNTER — Other Ambulatory Visit: Payer: Self-pay | Admitting: Family Medicine

## 2021-10-24 ENCOUNTER — Other Ambulatory Visit: Payer: Self-pay | Admitting: General Surgery

## 2021-10-24 DIAGNOSIS — N63 Unspecified lump in unspecified breast: Secondary | ICD-10-CM

## 2021-10-24 DIAGNOSIS — Z1231 Encounter for screening mammogram for malignant neoplasm of breast: Secondary | ICD-10-CM

## 2021-11-10 ENCOUNTER — Other Ambulatory Visit: Payer: Self-pay | Admitting: Family Medicine

## 2021-11-10 DIAGNOSIS — K259 Gastric ulcer, unspecified as acute or chronic, without hemorrhage or perforation: Secondary | ICD-10-CM

## 2021-11-10 DIAGNOSIS — K219 Gastro-esophageal reflux disease without esophagitis: Secondary | ICD-10-CM

## 2021-11-11 NOTE — Telephone Encounter (Signed)
Requested Prescriptions  Pending Prescriptions Disp Refills  . pantoprazole (PROTONIX) 40 MG tablet [Pharmacy Med Name: PANTOPRAZOLE 40MG  TABLETS] 90 tablet 1    Sig: TAKE 1 TABLET BY MOUTH EVERY DAY     Gastroenterology: Proton Pump Inhibitors Passed - 11/10/2021  3:18 AM      Passed - Valid encounter within last 12 months    Recent Outpatient Visits          3 months ago Medicare annual wellness visit, subsequent   [facility] Jerrol Banana., MD   4 months ago [place] Jerrol Banana., MD   1 year ago Adult hypothyroidism   [facility] Jerrol Banana., MD   1 year ago [facility] to [facility] preventive visit   [facility] Jerrol Banana., MD   1 year ago Adult hypothyroidism   [facility] Jerrol Banana., MD      Future Appointments            In 2 months Jerrol Banana., MD [facility], [place]

## 2021-11-15 ENCOUNTER — Ambulatory Visit
Admission: RE | Admit: 2021-11-15 | Discharge: 2021-11-15 | Disposition: A | Discharge status: Home / Self Care | Payer: Medicare Other | Source: Ambulatory Visit | Attending: General Surgery | Admitting: General Surgery

## 2021-11-15 ENCOUNTER — Other Ambulatory Visit: Payer: Self-pay

## 2021-11-15 DIAGNOSIS — Z1231 Encounter for screening mammogram for malignant neoplasm of breast: Secondary | ICD-10-CM | POA: Insufficient documentation

## 2022-01-30 ENCOUNTER — Ambulatory Visit: Payer: Medicare Other | Admitting: Family Medicine

## 2022-01-30 NOTE — Progress Notes (Unsigned)
Established patient visit   Patient: Kathy Mcintosh   DOB: December 22, 1955   66 y.o. Female  MRN: [id number] Visit Date: 01/30/2022  Today's healthcare provider: Wilhemena Durie, MD   No chief complaint on file.  Subjective    HPI  Hypertension, follow-up  BP Readings from Last 3 Encounters:  07/31/21 134/85  07/12/21 132/83  10/31/20 (!) 148/91   Wt Readings from Last 3 Encounters:  07/31/21 216 lb (98 kg)  07/12/21 218 lb (98.9 kg)  10/31/20 240 lb (108.9 kg)     She was last seen for hypertension 6 months ago.  BP at that visit was 134/85. Management since that visit includes; Good control with home readings being 120s to 130s. She reports {excellent/good/fair/poor:19665} compliance with treatment. She {is/is not:9024} having side effects. {document side effects if present:1} She {is/is not:9024} exercising. She {is/is not:9024} adherent to low salt diet.   Outside blood pressures are {enter patient reported home BP, or 'not being checked':1}.  She {does/does not:200015} smoke.  Use of agents associated with hypertension: {bp agents assoc with hypertension:511::"none"}.   --------------------------------------------------------------------------------------------------- Hypothyroid, follow-up  Lab Results  Component Value Date   TSH 2.060 07/17/2021   TSH 2.090 11/07/2020   TSH 2.460 01/27/2020    Wt Readings from Last 3 Encounters:  07/31/21 216 lb (98 kg)  07/12/21 218 lb (98.9 kg)  10/31/20 240 lb (108.9 kg)    She was last seen for hypothyroid 6 months ago.  Management since that visit includes; labs checked-no changes. She reports {excellent/good/fair/poor:19665} compliance with treatment. She {is/is not:21021397} having side effects. {document side effects if present:1}  -----------------------------------------------------------------------------------------    Medications: Outpatient Medications Prior to Visit  Medication Sig   B  COMPLEX VITAMINS PO Take by mouth.   cholecalciferol (VITAMIN D) 1000 UNITS tablet Take 1,000 Units by mouth daily.   Cimetidine (TAGAMET PO) Take by mouth.   cyclobenzaprine (FLEXERIL) 5 MG tablet TAKE 1 TABLET BY MOUTH EVERY NIGHT AT BEDTIME   estradiol (ESTRACE VAGINAL) 0.1 MG/GM vaginal cream Place 1 Applicatorful vaginally at bedtime.   fluticasone (FLONASE) 50 MCG/ACT nasal spray PLACE 2 SPRAYS INTO BOTH NOSTRILS DAILY.   gabapentin (NEURONTIN) 100 MG capsule TAKE 1 CAPSULE(100 MG) BY MOUTH TWICE DAILY   levothyroxine (SYNTHROID) 100 MCG tablet TAKE 1 TABLET(100 MCG) BY MOUTH DAILY   loratadine (CLARITIN) 10 MG tablet TAKE 1 TABLET (10 MG TOTAL) BY MOUTH DAILY.   losartan (COZAAR) 50 MG tablet TAKE 1 TABLET(50 MG) BY MOUTH DAILY   Multiple Vitamin (MULTIVITAMIN) LIQD Take 5 mLs by mouth daily.   ondansetron (ZOFRAN) 8 MG tablet Take 1 tablet (8 mg total) by mouth every 8 (eight) hours as needed for nausea or vomiting.   pantoprazole (PROTONIX) 40 MG tablet TAKE 1 TABLET BY MOUTH EVERY DAY   sucralfate (CARAFATE) 1 g tablet Take 1 tablet (1 g total) by mouth 4 (four) times daily.   vitamin B-12 (CYANOCOBALAMIN) 1000 MCG tablet Take 1,000 mcg by mouth daily.   No facility-administered medications prior to visit.    Review of Systems  Constitutional:  Negative for appetite change, chills, fatigue and fever.  Respiratory:  Negative for chest tightness and shortness of breath.   Cardiovascular:  Negative for chest pain and palpitations.  Gastrointestinal:  Negative for abdominal pain, nausea and vomiting.  Neurological:  Negative for dizziness and weakness.   {Labs   Heme   Chem   Endocrine   Serology   Results Review (  optional):23779}   Objective    LMP 11/28/1990  {Show previous vital signs (optional):23777}  Physical Exam  ***  No results found for any visits on 01/30/22.  Assessment & Plan     ***  No follow-ups on file.      {provider attestation***:1}   Wilhemena Durie, MD  [facility] [phone] (phone) [phone] (fax)  [place]

## 2022-01-31 ENCOUNTER — Other Ambulatory Visit: Payer: Self-pay | Admitting: *Deleted

## 2022-01-31 DIAGNOSIS — E6609 Other obesity due to excess calories: Secondary | ICD-10-CM

## 2022-01-31 DIAGNOSIS — Z780 Asymptomatic menopausal state: Secondary | ICD-10-CM

## 2022-01-31 DIAGNOSIS — E039 Hypothyroidism, unspecified: Secondary | ICD-10-CM

## 2022-01-31 DIAGNOSIS — Z6838 Body mass index (BMI) 38.0-38.9, adult: Secondary | ICD-10-CM

## 2022-01-31 DIAGNOSIS — I10 Essential (primary) hypertension: Secondary | ICD-10-CM

## 2022-02-01 LAB — COMPREHENSIVE METABOLIC PANEL
ALT: 10 IU/L (ref 0–32)
AST: 16 IU/L (ref 0–40)
Albumin/Globulin Ratio: 1.7 (ref 1.2–2.2)
Albumin: 4 g/dL (ref 3.8–4.8)
Alkaline Phosphatase: 152 IU/L — ABNORMAL HIGH (ref 44–121)
BUN/Creatinine Ratio: 20 (ref 12–28)
BUN: 11 mg/dL (ref 8–27)
Bilirubin Total: 0.5 mg/dL (ref 0.0–1.2)
CO2: 24 mmol/L (ref 20–29)
Calcium: 9.3 mg/dL (ref 8.7–10.3)
Chloride: 106 mmol/L (ref 96–106)
Creatinine, Ser: 0.55 mg/dL — ABNORMAL LOW (ref 0.57–1.00)
Globulin, Total: 2.3 g/dL (ref 1.5–4.5)
Glucose: 92 mg/dL (ref 70–99)
Potassium: 4.8 mmol/L (ref 3.5–5.2)
Sodium: 143 mmol/L (ref 134–144)
Total Protein: 6.3 g/dL (ref 6.0–8.5)
eGFR: 101 mL/min/{1.73_m2} (ref >59)

## 2022-02-01 LAB — TSH: TSH: 2.31 u[IU]/mL (ref 0.450–4.500)

## 2022-02-06 ENCOUNTER — Encounter: Payer: Self-pay | Admitting: Family Medicine

## 2022-02-06 ENCOUNTER — Ambulatory Visit (INDEPENDENT_AMBULATORY_CARE_PROVIDER_SITE_OTHER): Payer: Medicare Other | Admitting: Family Medicine

## 2022-02-06 ENCOUNTER — Other Ambulatory Visit: Payer: Self-pay

## 2022-02-06 ENCOUNTER — Ambulatory Visit: Payer: Medicare Other | Admitting: Family Medicine

## 2022-02-06 VITALS — BP 145/77 | HR 71 | Temp 98.2°F | Resp 16 | Ht 66.0 in | Wt 225.0 lb

## 2022-02-06 DIAGNOSIS — E7800 Pure hypercholesterolemia, unspecified: Secondary | ICD-10-CM

## 2022-02-06 DIAGNOSIS — E538 Deficiency of other specified B group vitamins: Secondary | ICD-10-CM

## 2022-02-06 DIAGNOSIS — R1033 Periumbilical pain: Secondary | ICD-10-CM | POA: Diagnosis not present

## 2022-02-06 DIAGNOSIS — R1084 Generalized abdominal pain: Secondary | ICD-10-CM

## 2022-02-06 DIAGNOSIS — I10 Essential (primary) hypertension: Secondary | ICD-10-CM

## 2022-02-06 DIAGNOSIS — E6609 Other obesity due to excess calories: Secondary | ICD-10-CM

## 2022-02-06 DIAGNOSIS — Z23 Encounter for immunization: Secondary | ICD-10-CM

## 2022-02-06 DIAGNOSIS — E039 Hypothyroidism, unspecified: Secondary | ICD-10-CM

## 2022-02-06 DIAGNOSIS — Z6838 Body mass index (BMI) 38.0-38.9, adult: Secondary | ICD-10-CM

## 2022-02-06 NOTE — Progress Notes (Signed)
Established patient visit   Patient: Kathy Mcintosh   DOB: 20-Apr-1956   66 y.o. Female  MRN: [id number] Visit Date: 02/06/2022  Today's healthcare provider: Wilhemena Durie, MD   Chief Complaint  Patient presents with   6 month follow up   Subjective    HPI  She comes in today for follow-up.  She recently had a fall and fractured her right patella.  We are treating that conservatively.  She sees GYN at [facility] for pelvic pain.  Dr. Sharlett Iles.  Lately she is having periumbilical pain that radiates to the back.  Patient has chronic constipation but has no new GI or GU complaints.  She states when the pain is bad as a 7/10 and right now is a 0 /10.  Hypertension, follow-up   BP Readings from Last 3 Encounters:  02/06/22 (!) 145/77  07/31/21 134/85  07/12/21 132/83   Wt Readings from Last 3 Encounters:  02/06/22 225 lb (102.1 kg)  07/31/21 216 lb (98 kg)  07/12/21 218 lb (98.9 kg)     She was last seen for hypertension 6 months ago.  BP at that visit was 134/85.  Management since that visit includes; on losartan.  She reports good compliance with treatment. She is not having side effects.  She is following a  low carb  diet. She is exercising. She does not smoke.  Outside blood pressures are Pt does not check at home  Pertinent labs: Lab Results  Component Value Date   CHOL 167 11/07/2020   HDL 57 11/07/2020   LDLCALC 92 11/07/2020   TRIG 96 11/07/2020   CHOLHDL 2.9 11/07/2020   Lab Results  Component Value Date   NA 143 01/31/2022   K 4.8 01/31/2022   CREATININE 0.55 (L) 01/31/2022   EGFR 101 01/31/2022   GLUCOSE 92 01/31/2022   TSH 2.310 01/31/2022     The 10-year ASCVD risk score (Arnett DK, et al., 2019) is: 9.5%   --------------------------------------------------------------------------------------------------- Hypothyroid, follow-up  Lab Results  Component Value Date   TSH 2.310 01/31/2022   TSH 2.060 07/17/2021   TSH 2.090 11/07/2020     Wt Readings from Last 3 Encounters:  02/06/22 225 lb (102.1 kg)  07/31/21 216 lb (98 kg)  07/12/21 218 lb (98.9 kg)    She was last seen for hypothyroid 6 months ago.  Management since that visit includes; patient had labs checked on 01/31/2022. She reports good compliance with treatment. She is not having side effect  -----------------------------------------------------------------------------------------   Medications: Outpatient Medications Prior to Visit  Medication Sig   B COMPLEX VITAMINS PO Take by mouth.   cholecalciferol (VITAMIN D) 1000 UNITS tablet Take 1,000 Units by mouth daily.   Cimetidine (TAGAMET PO) Take by mouth.   cyclobenzaprine (FLEXERIL) 5 MG tablet TAKE 1 TABLET BY MOUTH EVERY NIGHT AT BEDTIME   estradiol (ESTRACE VAGINAL) 0.1 MG/GM vaginal cream Place 1 Applicatorful vaginally at bedtime.   fluticasone (FLONASE) 50 MCG/ACT nasal spray PLACE 2 SPRAYS INTO BOTH NOSTRILS DAILY.   gabapentin (NEURONTIN) 100 MG capsule TAKE 1 CAPSULE(100 MG) BY MOUTH TWICE DAILY   levothyroxine (SYNTHROID) 100 MCG tablet TAKE 1 TABLET(100 MCG) BY MOUTH DAILY   loratadine (CLARITIN) 10 MG tablet TAKE 1 TABLET (10 MG TOTAL) BY MOUTH DAILY.   losartan (COZAAR) 50 MG tablet TAKE 1 TABLET(50 MG) BY MOUTH DAILY   Multiple Vitamin (MULTIVITAMIN) LIQD Take 5 mLs by mouth daily.   ondansetron (ZOFRAN) 8 MG tablet  Take 1 tablet (8 mg total) by mouth every 8 (eight) hours as needed for nausea or vomiting.   pantoprazole (PROTONIX) 40 MG tablet TAKE 1 TABLET BY MOUTH EVERY DAY   sucralfate (CARAFATE) 1 g tablet Take 1 tablet (1 g total) by mouth 4 (four) times daily.   vitamin B-12 (CYANOCOBALAMIN) 1000 MCG tablet Take 1,000 mcg by mouth daily.   No facility-administered medications prior to visit.    Review of Systems  Constitutional:  Negative for appetite change, chills, fatigue and fever.  Respiratory:  Negative for chest tightness and shortness of breath.   Cardiovascular:   Negative for chest pain and palpitations.  Gastrointestinal:  Negative for abdominal pain, nausea and vomiting.  Neurological:  Negative for dizziness and weakness.   Last thyroid functions Lab Results  Component Value Date   TSH 2.310 01/31/2022       Objective    BP (!) 145/77 (BP Location: Right Arm, Patient Position: Sitting, Cuff Size: Normal)    Pulse 71    Temp 98.2 F (36.8 C) (Temporal)    Resp 16    Ht 5' 6"  (1.676 m)    Wt 225 lb (102.1 kg)    LMP 11/28/1990    BMI 36.32 kg/m  BP Readings from Last 3 Encounters:  02/06/22 (!) 145/77  07/31/21 134/85  07/12/21 132/83   Wt Readings from Last 3 Encounters:  02/06/22 225 lb (102.1 kg)  07/31/21 216 lb (98 kg)  07/12/21 218 lb (98.9 kg)      Physical Exam Vitals reviewed.  Constitutional:      Appearance: She is well-developed.  HENT:     Head: Normocephalic and atraumatic.     Right Ear: External ear normal.     Left Ear: External ear normal.     Nose: Nose normal.  Eyes:     General: No scleral icterus.    Conjunctiva/sclera: Conjunctivae normal.     Pupils: Pupils are equal, round, and reactive to light.  Cardiovascular:     Rate and Rhythm: Normal rate and regular rhythm.     Heart sounds: Normal heart sounds.  Pulmonary:     Effort: Pulmonary effort is normal.     Breath sounds: Normal breath sounds.  Abdominal:     General: Bowel sounds are normal.     Palpations: Abdomen is soft.     Tenderness: There is no abdominal tenderness.  Musculoskeletal:        General: Normal range of motion.     Cervical back: Normal range of motion and neck supple.  Skin:    General: Skin is warm and dry.  Neurological:     General: No focal deficit present.     Mental Status: She is alert and oriented to person, place, and time. Mental status is at baseline.     Deep Tendon Reflexes: Reflexes are normal and symmetric.  Psychiatric:        Mood and Affect: Mood normal.        Behavior: Behavior normal.         Thought Content: Thought content normal.        Judgment: Judgment normal.      No results found for any visits on 02/06/22.  Assessment & Plan     1. Periumbilical pain Obtain general abdominal ultrasound.  Patient request referral to Dr. Bary Castilla from surgery who is cared for before. - Ambulatory referral to Gastroenterology - US Abdomen Complete; Future  2. Generalized abdominal pain  As above.  Very nonspecific pain at this time. Metamucil daily for chronic constipation.  Followed by Dr. Kathryne Hitch for pelvic pain at [facility] - US Abdomen Complete; Future  3. Need for influenza vaccination  - Flu Vaccine QUAD High Dose(Fluad)  4. Need for pneumococcal vaccination  - Pneumococcal conjugate vaccine 20-valent (Prevnar 20)  5. Class 2 obesity due to excess calories without serious comorbidity with body mass index (BMI) of 38.0 to 38.9 in adult Diet and exercise again stressed.  6. B12 deficiency   7. Adult hypothyroidism   8. Essential hypertension Under fairly good control.  Follow blood pressures at home.  9. Hypercholesteremia    No follow-ups on file.      I, Wilhemena Durie, MD, have reviewed all documentation for this visit. The documentation on 02/10/22 for the exam, diagnosis, procedures, and orders are all accurate and complete.    Regnald Bowens Cranford Mon, MD  [facility] [phone] (phone) [phone] (fax)  [place]

## 2022-02-06 NOTE — Patient Instructions (Addendum)
Try Metamucil daily. Please call to schedule a 6 months/CPE around 08/01/22

## 2022-02-11 ENCOUNTER — Other Ambulatory Visit: Payer: Self-pay | Admitting: Family Medicine

## 2022-02-12 ENCOUNTER — Ambulatory Visit
Admission: RE | Admit: 2022-02-12 | Discharge: 2022-02-12 | Disposition: A | Discharge status: Home / Self Care | Payer: Medicare Other | Source: Ambulatory Visit | Attending: Family Medicine | Admitting: Family Medicine

## 2022-02-12 ENCOUNTER — Other Ambulatory Visit: Payer: Self-pay

## 2022-02-12 DIAGNOSIS — R1084 Generalized abdominal pain: Secondary | ICD-10-CM

## 2022-02-12 DIAGNOSIS — R1033 Periumbilical pain: Secondary | ICD-10-CM | POA: Diagnosis present

## 2022-02-12 NOTE — Telephone Encounter (Signed)
Requested Prescriptions  Pending Prescriptions Disp Refills   gabapentin (NEURONTIN) 100 MG capsule [Pharmacy Med Name: GABAPENTIN 100MG  CAPSULES] 60 capsule 12    Sig: TAKE 1 CAPSULE(100 MG) BY MOUTH TWICE DAILY     Neurology: Anticonvulsants - gabapentin Failed - 02/11/2022  3:14 AM      Failed - Cr in normal range and within 360 days    Creatinine, Ser  Date Value Ref Range Status  01/31/2022 0.55 (L) 0.57 - 1.00 mg/dL Final         Passed - Completed PHQ-2 or PHQ-9 in the last 360 days      Passed - Valid encounter within last 12 months    Recent Outpatient Visits          6 days ago Periumbilical pain   [facility] Jerrol Banana., MD   6 months ago Medicare annual wellness visit, subsequent   [facility] Jerrol Banana., MD   7 months ago [place] Jerrol Banana., MD   1 year ago Adult hypothyroidism   [facility] Jerrol Banana., MD   1 year ago [facility] preventive visit   [facility] Jerrol Banana., MD

## 2022-03-12 ENCOUNTER — Other Ambulatory Visit: Payer: Self-pay | Admitting: General Surgery

## 2022-03-12 DIAGNOSIS — K439 Ventral hernia without obstruction or gangrene: Secondary | ICD-10-CM

## 2022-03-26 ENCOUNTER — Ambulatory Visit
Admission: RE | Admit: 2022-03-26 | Discharge: 2022-03-26 | Disposition: A | Discharge status: Home / Self Care | Payer: Medicare Other | Source: Ambulatory Visit | Attending: General Surgery | Admitting: General Surgery

## 2022-03-26 ENCOUNTER — Other Ambulatory Visit: Payer: Self-pay

## 2022-03-26 DIAGNOSIS — K439 Ventral hernia without obstruction or gangrene: Secondary | ICD-10-CM | POA: Insufficient documentation

## 2022-03-26 LAB — POCT I-STAT CREATININE: Creatinine, Ser: 0.5 mg/dL (ref 0.44–1.00)

## 2022-03-26 MED ORDER — IOHEXOL 300 MG/ML  SOLN
100.0000 mL | Freq: Once | INTRAMUSCULAR | Status: AC | PRN
  Administered 2022-03-26: 100 mL via INTRAVENOUS

## 2022-04-04 ENCOUNTER — Other Ambulatory Visit: Payer: Self-pay | Admitting: General Surgery

## 2022-04-04 DIAGNOSIS — R1013 Epigastric pain: Secondary | ICD-10-CM

## 2022-04-04 DIAGNOSIS — R1033 Periumbilical pain: Secondary | ICD-10-CM

## 2022-04-10 ENCOUNTER — Ambulatory Visit
Admission: RE | Admit: 2022-04-10 | Discharge: 2022-04-10 | Disposition: A | Discharge status: Home / Self Care | Payer: Medicare Other | Source: Ambulatory Visit | Attending: General Surgery | Admitting: General Surgery

## 2022-04-10 DIAGNOSIS — R1033 Periumbilical pain: Secondary | ICD-10-CM | POA: Insufficient documentation

## 2022-04-11 ENCOUNTER — Telehealth: Payer: Self-pay

## 2022-04-11 NOTE — Telephone Encounter (Signed)
This is was all through Dr Fleet Contras ? ? ?Copied from [place] [id number]. Topic: General - Other ?>> Apr 11, 2022 10:24 AM McGill, Nelva Bush wrote: ?Reason for CRM: Pt requesting a call back to discuss CT scan results for abdomen with Dr.Gilbert/Nurse . ?Asking if there is anything she needs to do or anything Dr.Gilbert recommends. ?

## 2022-04-12 ENCOUNTER — Other Ambulatory Visit: Payer: Self-pay | Admitting: General Surgery

## 2022-04-12 NOTE — Progress Notes (Signed)
Subjective:  ?  ? Patient ID: Kathy Mcintosh is a 66 y.o. female. ?  ?HPI ?  ?The following portions of the patient's history were reviewed and updated as appropriate. ?  ?This an established patient is here today for: office visit. The patient is here today to follow up from a recent CT scan.  The scan was completed to assess for possible ventral hernia post open gastric bypass in detail.  She developed episodic pain dating back from December 22 through March 2023.  Very well after this time as well. ?  ?  ?   ?Chief Complaint  ?Patient presents with  ? Follow-up  ?  ?  ?BP (!) 168/90   Pulse 80   Temp 36.3 ?C (97.4 ?F) (Temporal)   Ht 170.2 cm ('5\' 7"'$ )   Wt (!) 103 kg (227 lb)   LMP  (LMP Unknown)   SpO2 98%   BMI 35.55 kg/m?  ?  ?    ?Past Medical History:  ?Diagnosis Date  ? Arthritis    ? Atypical ductal hyperplasia of breast 01/01/2013  ?  0.2 mm area of ADH and associated intraductal papilloma.  Completed 5 years tamoxifen chemoprevention.  ? COVID-19 01/2020  ? Gastric reflux    ? Hypertension    ? Migraines    ? Obesity    ? PONV (postoperative nausea and vomiting)    ? Sleep apnea    ?  resolved with weight loss  ? Thyroid disease    ? Uterovaginal prolapse 07/23/2013  ?  ?  ?     ?Past Surgical History:  ?Procedure Laterality Date  ? CHOLECYSTECTOMY N/A 1983  ? GASTRIC BYPASS OPEN N/A 2004  ?  [place], [place]  ? pannulectomy N/A 2006  ? BREAST SURGERY Right 12/2012  ?  Lumpectomy  ? COLPORRHAPHY FOR REPAIR CYSTOCELE ANTERIOR N/A 10/08/2013  ?  Procedure: COLPORRHAPHY FOR REPAIR CYSTOCELE ANTERIOR;  Surgeon: Delanna Notice, MD;  Location: [place];  Service: Gynecology;  Laterality: N/A;  ? CYSTOURETHROSCOPY N/A 10/08/2013  ?  Procedure: CYSTOURETHROSCOPY;  Surgeon: Delanna Notice, MD;  Location: [place];  Service: Gynecology;  Laterality: N/A;  ? HYSTERECTOMY VAGINAL N/A 10/08/2013  ?  Procedure: HYSTERECTOMY VAGINAL;  Surgeon: Delanna Notice, MD;  Location: [place];   Service: Gynecology;  Laterality: N/A;  ? COLPOPEXY FOR SUSPENSION UTEROSACRUM INTRAPERITONEAL N/A 10/08/2013  ?  Procedure: COLPOPEXY FOR SUSPENSION UTEROSACRUM INTRAPERITONEAL;  Surgeon: Delanna Notice, MD;  Location: [place];  Service: Gynecology;  Laterality: N/A;  ? REPAIR VAGINAL PROLAPSE W/WO SACROSPINOUS LIGAMENT SUSPENSION N/A 10/08/2013  ?  Procedure: REPAIR VAGINAL PROLAPSE W/WO SACROSPINOUS LIGAMENT SUSPENSION;  Surgeon: Delanna Notice, MD;  Location: [place];  Service: Gynecology;  Laterality: N/A;  ? SLING FOR STRESS INCONTINENCE N/A 10/08/2013  ?  Procedure: SLING FOR STRESS INCONTINENCE;  Surgeon: Delanna Notice, MD;  Location: [place];  Service: Gynecology;  Laterality: N/A;  ? CYSTOURETHROSCOPY N/A 12/03/2013  ?  Procedure: CYSTOURETHROSCOPY;  Surgeon: Delanna Notice, MD;  [facility];  Service: Gynecology;  Laterality: N/A;  ? REVISION/REMOVAL PROSTHETIC VAGINAL GRAFT N/A 12/03/2013  ?  Procedure: REVISION (INCLUDING REMOVAL) OF PROSTHETIC VAGINAL GRAFT; VAGINAL APPROACH;  Surgeon: Delanna Notice, MD;  [facility];  Service: Gynecology;  Laterality: N/A;  ? COLONOSCOPY   01/15/2017  ?  Normal.  10-year follow-up.  ? EGD   01/15/2017  ?  Anastomotic ulcer  secondary to nonsteroidal use.  ? BARIATRIC SURGERY      ? KNEE ARTHROSCOPY      ? knee replacement(both)      ? wisdom tooth extraction      ?  ?  ?  ?        ?OB History   ?  Gravida  ?3  ? Para  ?2  ? Term  ?   ? Preterm  ?   ? AB  ?   ? Living  ?2  ?  ?  SAB  ?   ? IAB  ?   ? Ectopic  ?   ? Molar  ?   ? Multiple  ?   ? Live Births  ?   ?  ?  ?  Obstetric Comments  ?Age at first period 60 ?Age of first pregnancy 78 ?   ?  ?   ?  ?  ?Social History  ?  ?  ?     ?Socioeconomic History  ? Marital status: Married  ?Tobacco Use  ? Smoking status: Never  ? Smokeless tobacco: Never  ?Vaping Use  ? Vaping Use: Never used  ?Substance and Sexual Activity  ? Alcohol use: No  ? Drug use: No  ? Sexual activity: Not  Currently  ?    Partners: Male  ?  ?  ?  ?    ?Allergies  ?Allergen Reactions  ? Sulfa (Sulfonamide Antibiotics) Nausea and Vomiting  ?  ?  ?Current Medications  ?      ?Current Outpatient Medications  ?Medication Sig Dispense Refill  ? b complex vitamins tablet Take 1 tablet by mouth every evening.       ? cholecalciferol (VITAMIN D3) 1000 unit tablet Take 1,000 Units by mouth nightly.       ? cimetidine (TAGAMET) 200 MG tablet Take 200 mg by mouth as needed      ? cyanocobalamin (VITAMIN B12) 1000 MCG tablet Take 1 tablet by mouth once daily      ? cyclobenzaprine (FLEXERIL) 5 MG tablet Take 1 tablet by mouth nightly      ? estradioL (ESTRACE) 0.01 % (0.1 mg/gram) vaginal cream Place vaginally      ? ferrous sulfate 325 (65 FE) MG tablet Take 325 mg by mouth daily with breakfast      ? fluticasone (FLONASE) 50 mcg/actuation nasal spray Place 2 sprays into both nostrils as needed   12  ? gabapentin (NEURONTIN) 300 MG capsule TAKE 1 CAPSULE BY MOUTH NIGHTLY INCREASE TO 2 AT NIGHT TIME AFTER 1 WEEK IF NOT IMPROVEMENT (Patient taking differently: Take 600 mg by mouth 2 (two) times daily) 60 capsule 0  ? levothyroxine (SYNTHROID, LEVOTHROID) 100 MCG tablet Take 100 mcg by mouth every evening. Take on an empty stomach with a glass of water at least 30-60 minutes before breakfast.      ? loratadine (CLARITIN) 10 mg tablet Take 10 mg by mouth once daily.   11  ? losartan (COZAAR) 50 MG tablet Take 1 tablet by mouth once daily      ? metoclopramide (REGLAN) 10 MG tablet Take 10 mg by mouth as needed      ? multivitamin tablet Take 1 tablet by mouth every evening.       ? ondansetron (ZOFRAN) 8 MG tablet Take 1 tablet by mouth as needed      ? pantoprazole (PROTONIX) 40 MG DR tablet Take 40 mg by mouth once daily      ?  sucralfate (CARAFATE) 1 gram tablet TAKE 1 TABLET BY MOUTH BEFORE MEALS AND AT BEDTIME   12  ?  ?No current facility-administered medications for this visit.  ?  ?  ?  ?     ?Family History  ?Problem  Relation Age of Onset  ? Cervical cancer Mother    ? High blood pressure (Hypertension) Mother    ? Vaginal prolapse Mother    ? Thyroid disease Mother    ? Urinary Incontinence Mother    ? Stroke Mother    ? Dementia Mother    ? Heart disease Father    ? High blood pressure (Hypertension) Father    ? Cervical cancer Sister    ? High blood pressure (Hypertension) Sister    ? Hyperlipidemia (Elevated cholesterol) Sister    ? Breast cancer Sister    ? High blood pressure (Hypertension) Sister    ? Rheum arthritis Sister    ? Anesthesia problems Neg Hx    ? Malignant hyperthermia Neg Hx    ? Colon cancer Neg Hx    ?  ?  ?  ?Labs and Radiology:  ?  ?CT of the abdomen pelvis dated March 26, 2022: ?  ?IMPRESSION: ?1. Irregular bowel wall thickening of the jejunojejunostomy site. ?Correlation with prior cross-sectional imaging would be of value. ?2. Small fat containing umbilical hernia. No findings suggest ?associated ischemia or bowel obstruction. ?3. A 6 mm left lower lobe pulmonary nodule. Non-contrast chest CT at ?6-12 months is recommended. If the nodule is stable at time of ?repeat CT, then future CT at 18-24 months (from today's scan) is ?considered optional for low-risk patients, but is recommended for ?high-risk patients. This recommendation follows the consensus ?statement: Guidelines for Management of Incidental Pulmonary Nodules ?Detected on CT Images: From the Fleischner Society 2017; Radiology ?2017; 573:220-254. ?4. Bilateral L5 pars interarticularis defects with grade 1 ?anterolisthesis L5 on S1 and mild retrolisthesis of L4 on L5. ?  ?This study was independently reviewed. ?Small umbilical hernia which does not correlate with the focal tenderness noted on clinical exam. ?  ?January 15, 2017 EGD for epigastric pain, heme positive stools ?  ?Multiple 5 mm sessile polyps with no bleeding and no stigmata of recent bleeding were found at the anastomosis. Biopsies were ?taken with a cold forceps for  histology. ?One non-bleeding cratered gastric ulcer with no stigmata of bleeding was found at the anastomosis. The lesion was 8 mm in largest ?dimension. Biopsies were taken with a cold forceps for histology. ?The examined jejun

## 2022-04-18 ENCOUNTER — Other Ambulatory Visit: Payer: Self-pay | Admitting: Family Medicine

## 2022-04-18 DIAGNOSIS — K219 Gastro-esophageal reflux disease without esophagitis: Secondary | ICD-10-CM

## 2022-04-18 MED ORDER — SUCRALFATE 1 G PO TABS: 1.0000 g | ORAL_TABLET | Freq: Four times a day (QID) | ORAL | 2 refills | Status: DC

## 2022-04-18 NOTE — Telephone Encounter (Signed)
Medication Refill - Medication:  ?Sucralfate ? ?Has the patient contacted their pharmacy? Yes.   ?Contact PCP- ?*Pt mentioned that she was talking to PCP about this medication, but wasn't sure if insurance was going to approve it, but wanted to try, please advise.  ? ?Preferred Pharmacy (with phone number or street name):  ?[facility] [place], [place] AT [facility] [address] [place] Phone:  [phone]  ?Fax:  [phone]  ?  ? ? ?Has the patient been seen for an appointment in the last year OR does the patient have an upcoming appointment? Yes.   ? ?Agent: Please be advised that RX refills may take up to 3 business days. We ask that you follow-up with your pharmacy. ?

## 2022-04-18 NOTE — Telephone Encounter (Signed)
Requested Prescriptions  ?Pending Prescriptions Disp Refills  ?? sucralfate (CARAFATE) 1 g tablet 120 tablet 2  ?  Sig: Take 1 tablet (1 g total) by mouth 4 (four) times daily.  ?  ? Gastroenterology: Antiacids Passed - 04/18/2022  4:34 PM  ?  ?  Passed - Valid encounter within last 12 months  ?  Recent Outpatient Visits   ?      ? 2 months ago Periumbilical pain  ? [facility] Jerrol Banana., MD  ? 8 months ago Medicare annual wellness visit, subsequent  ? [facility] Jerrol Banana., MD  ? 9 months ago Tachycardia  ? [facility] Jerrol Banana., MD  ? 1 year ago Adult hypothyroidism  ? [facility] Jerrol Banana., MD  ? 1 year ago [facility] preventive visit  ? [facility] Jerrol Banana., MD  ?  ?  ? ?  ?  ?  ? ?

## 2022-04-23 ENCOUNTER — Encounter: Payer: Self-pay | Admitting: General Surgery

## 2022-04-24 ENCOUNTER — Ambulatory Visit: Payer: Medicare Other | Admitting: Certified Registered Nurse Anesthetist

## 2022-04-24 ENCOUNTER — Encounter
Admission: RE | Disposition: A | Discharge status: Home / Self Care | Payer: Self-pay | Source: Home / Self Care | Attending: General Surgery

## 2022-04-24 ENCOUNTER — Ambulatory Visit
Admission: RE | Admit: 2022-04-24 | Discharge: 2022-04-24 | Disposition: A | Discharge status: Home / Self Care | Payer: Medicare Other | Attending: General Surgery | Admitting: General Surgery

## 2022-04-24 DIAGNOSIS — K219 Gastro-esophageal reflux disease without esophagitis: Secondary | ICD-10-CM | POA: Insufficient documentation

## 2022-04-24 DIAGNOSIS — I10 Essential (primary) hypertension: Secondary | ICD-10-CM | POA: Diagnosis not present

## 2022-04-24 DIAGNOSIS — R1013 Epigastric pain: Secondary | ICD-10-CM | POA: Insufficient documentation

## 2022-04-24 DIAGNOSIS — Z9884 Bariatric surgery status: Secondary | ICD-10-CM | POA: Insufficient documentation

## 2022-04-24 HISTORY — PX: ESOPHAGOGASTRODUODENOSCOPY (EGD) WITH PROPOFOL: SHX5813

## 2022-04-24 HISTORY — DX: Headache, unspecified: R51.9

## 2022-04-24 HISTORY — DX: Unspecified osteoarthritis, unspecified site: M19.90

## 2022-04-24 HISTORY — DX: Gastro-esophageal reflux disease without esophagitis: K21.9

## 2022-04-24 HISTORY — DX: Nausea with vomiting, unspecified: R11.2

## 2022-04-24 HISTORY — DX: Other specified postprocedural states: Z98.890

## 2022-04-24 HISTORY — DX: Sleep apnea, unspecified: G47.30

## 2022-04-24 HISTORY — DX: Essential (primary) hypertension: I10

## 2022-04-24 SURGERY — ESOPHAGOGASTRODUODENOSCOPY (EGD) WITH PROPOFOL
Anesthesia: General

## 2022-04-24 MED ORDER — DEXMEDETOMIDINE (PRECEDEX) IN NS 20 MCG/5ML (4 MCG/ML) IV SYRINGE
PREFILLED_SYRINGE | INTRAVENOUS | Status: DC | PRN
  Administered 2022-04-24: 8 ug via INTRAVENOUS

## 2022-04-24 MED ORDER — LIDOCAINE HCL (CARDIAC) PF 100 MG/5ML IV SOSY
PREFILLED_SYRINGE | INTRAVENOUS | Status: DC | PRN
  Administered 2022-04-24: 50 mg via INTRAVENOUS

## 2022-04-24 MED ORDER — PROPOFOL 10 MG/ML IV BOLUS
INTRAVENOUS | Status: DC | PRN
  Administered 2022-04-24: 10 mg via INTRAVENOUS
  Administered 2022-04-24: 60 mg via INTRAVENOUS
  Administered 2022-04-24: 10 mg via INTRAVENOUS

## 2022-04-24 MED ORDER — PROPOFOL 500 MG/50ML IV EMUL
INTRAVENOUS | Status: DC | PRN
  Administered 2022-04-24: 120 ug/kg/min via INTRAVENOUS

## 2022-04-24 MED ORDER — SODIUM CHLORIDE 0.9 % IV SOLN
INTRAVENOUS | Status: DC
  Administered 2022-04-24: 20 mL/h via INTRAVENOUS

## 2022-04-24 NOTE — Anesthesia Preprocedure Evaluation (Signed)
Anesthesia Evaluation  ?Patient identified by MRN, date of birth, ID band ?Patient awake ? ? ? ?Reviewed: ?Allergy & Precautions, NPO status , Patient's Chart, lab work & pertinent test results ? ?History of Anesthesia Complications ?(+) PONV and history of anesthetic complications ? ?Airway ?Mallampati: I ? ?TM Distance: >3 FB ?Neck ROM: Full ? ? ? Dental ? ?(+) Upper Dentures, Lower Dentures ?  ?Pulmonary ?sleep apnea , neg COPD, Patient abstained from smoking.Not current smoker,  ?  ?Pulmonary exam normal ?breath sounds clear to auscultation ? ? ? ? ? ? Cardiovascular ?Exercise Tolerance: Good ?METShypertension, Pt. on medications ?(-) CAD and (-) Past MI (-) dysrhythmias  ?Rhythm:Regular Rate:Normal ?- Systolic murmurs ? ?  ?Neuro/Psych ? Headaches, PSYCHIATRIC DISORDERS   ? GI/Hepatic ?GERD  Medicated and Controlled,(+)  ?  ? (-) substance abuse ? ,   ?Endo/Other  ?neg diabetesHypothyroidism  ? Renal/GU ?negative Renal ROS  ? ?  ?Musculoskeletal ? ? Abdominal ?(+) + obese,   ?Peds ? Hematology ?  ?Anesthesia Other Findings ?Past Medical History: ?No date: Arthritis ?2014: Atypical ductal hyperplasia of right breast ?2013: Breast cancer ([place]) ?No date: Esophageal spasm ?No date: GERD (gastroesophageal reflux disease) ?No date: Headache ?    Comment:  migraines ?No date: Hypertension ?No date: Hypothyroidism ?No date: PONV (postoperative nausea and vomiting) ?No date: Sleep apnea ? Reproductive/Obstetrics ? ?  ? ? ? ? ? ? ? ? ? ? ? ? ? ?  ?  ? ? ? ? ? ? ? ? ?Anesthesia Physical ?Anesthesia Plan ? ?ASA: 2 ? ?Anesthesia Plan: General  ? ?Post-op Pain Management: Minimal or no pain anticipated  ? ?Induction: Intravenous ? ?PONV Risk Score and Plan: 4 or greater and Propofol infusion, TIVA and Ondansetron ? ?Airway Management Planned: Nasal Cannula ? ?Additional Equipment: None ? ?Intra-op Plan:  ? ?Post-operative Plan:  ? ?Informed Consent: I have reviewed the patients History and  Physical, chart, labs and discussed the procedure including the risks, benefits and alternatives for the proposed anesthesia with the patient or authorized representative who has indicated his/her understanding and acceptance.  ? ? ? ?Dental advisory given ? ?Plan Discussed with: CRNA and Surgeon ? ?Anesthesia Plan Comments: (Discussed risks of anesthesia with patient, including possibility of difficulty with spontaneous ventilation under anesthesia necessitating airway intervention, PONV, and rare risks such as cardiac or respiratory or neurological events, and allergic reactions. Discussed the role of CRNA in patient's perioperative care. Patient understands.)  ? ? ? ? ? ? ?Anesthesia Quick Evaluation ? ?

## 2022-04-24 NOTE — Transfer of Care (Signed)
Immediate Anesthesia Transfer of Care Note ? ?Patient: Kathy Mcintosh ? ?Procedure(s) Performed: ESOPHAGOGASTRODUODENOSCOPY (EGD) WITH PROPOFOL ? ?Patient Location: PACU ? ?Anesthesia Type:General ? ?Level of Consciousness: drowsy ? ?Airway & Oxygen Therapy: Patient Spontanous Breathing ? ?Post-op Assessment: Report given to RN and Post -op Vital signs reviewed and stable ? ?Post vital signs: Reviewed and stable ? ?Last Vitals:  ?Vitals Value Taken Time  ?BP 147/84 04/24/22 1012  ?Temp    ?Pulse 85 04/24/22 1012  ?Resp 18 04/24/22 1012  ?SpO2 97 % 04/24/22 1012  ? ? ?Last Pain:  ?Vitals:  ? 04/24/22 1012  ?TempSrc:   ?PainSc: Asleep  ?   ? ?  ? ?Complications: No notable events documented. ?

## 2022-04-24 NOTE — Op Note (Signed)
[facility] ?Gastroenterology ?Patient Name: Kathy Mcintosh ?Procedure Date: 04/24/2022 9:35 AM ?MRN: [id number] ?Account #: [id number] ?Date of Birth: [birth year] ?Admit Type: Outpatient ?Age: 66 ?Room: [facility] ENDO ROOM 1 ?Gender: Female ?Note Status: Finalized ?Instrument Name: Peds Colonoscope [id number] ?Procedure:             Upper GI endoscopy ?Indications:           Epigastric abdominal pain ?Providers:             Robert Bellow, MD ?Referring MD:          Janine Ores. Rosanna Randy, MD (Referring MD) ?Medicines:             Propofol per Anesthesia ?Complications:         No immediate complications. ?Procedure:             Pre-Anesthesia Assessment: ?                       - Prior to the procedure, a History and Physical was  ?                       performed, and patient medications, allergies and  ?                       sensitivities were reviewed. The patient's tolerance  ?                       of previous anesthesia was reviewed. ?                       - The risks and benefits of the procedure and the  ?                       sedation options and risks were discussed with the  ?                       patient. All questions were answered and informed  ?                       consent was obtained. ?                       After obtaining informed consent, the endoscope was  ?                       passed under direct vision. Throughout the procedure,  ?                       the patient's blood pressure, pulse, and oxygen  ?                       saturations were monitored continuously. The  ?                       Colonoscope was introduced through the mouth, and  ?                       advanced to the efferent jejunal loop. The  ?  jejuno-jejunal anastomosis was not reached at the 130  ?                       cm mark from the incisors. The upper GI endoscopy was  ?                       accomplished without difficulty. The patient tolerated  ?                       the  procedure well. ?Findings: ?     The esophagus was normal. ?     [Site] was. ?     The examined jejunum was normal. Biopsies were taken with a cold forceps  ?     for histology. ?Impression:            - Normal esophagus. ?                       - Normal stomach. ?                       - Normal examined jejunum. Biopsied. ?Recommendation:        - Telephone endoscopist for pathology results in 1  ?                       week. ?Procedure Code(s):     --- Professional --- ?                       [phone], Esophagogastroduodenoscopy, flexible,  ?                       transoral; with biopsy, single or multiple ?Diagnosis Code(s):     --- Professional --- ?                       R10.13, Epigastric pain ?CPT copyright 2019 American Medical Association. All rights reserved. ?The codes documented in this report are preliminary and upon coder review may  ?be revised to meet current compliance requirements. ?Robert Bellow, MD ?04/24/2022 10:14:11 AM ?This report has been signed electronically. ?Number of Addenda: 0 ?Note Initiated On: 04/24/2022 9:35 AM ?Estimated Blood Loss:  Estimated blood loss was minimal. ?     [facility] ?

## 2022-04-24 NOTE — H&P (Signed)
Kathy Mcintosh ?[id number] ?June 29, 1956 ? ?  ? ?HPI:  66 y/o woman s/p gastric bypass with intermittent abdominal pain.  CT and SBFT suggest an abnormality at the small bowel anastomosis. For EGD.  No symptoms for the last two days.  ? ?Medications Prior to Admission  ?Medication Sig Dispense Refill Last Dose  ? B COMPLEX VITAMINS PO Take by mouth.   04/23/2022  ? cholecalciferol (VITAMIN D) 1000 UNITS tablet Take 1,000 Units by mouth daily.   04/23/2022  ? Cimetidine (TAGAMET PO) Take by mouth.   04/23/2022  ? cyclobenzaprine (FLEXERIL) 5 MG tablet TAKE 1 TABLET BY MOUTH EVERY NIGHT AT BEDTIME 30 tablet 3 04/23/2022  ? estradiol (ESTRACE VAGINAL) 0.1 MG/GM vaginal cream Place 1 Applicatorful vaginally at bedtime. 42.5 g 12 04/23/2022  ? ferrous sulfate 324 MG TBEC Take 324 mg by mouth.   04/23/2022  ? fluticasone (FLONASE) 50 MCG/ACT nasal spray PLACE 2 SPRAYS INTO BOTH NOSTRILS DAILY. 16 g 12 04/23/2022  ? gabapentin (NEURONTIN) 100 MG capsule TAKE 1 CAPSULE(100 MG) BY MOUTH TWICE DAILY 60 capsule 12 04/23/2022  ? levothyroxine (SYNTHROID) 100 MCG tablet TAKE 1 TABLET(100 MCG) BY MOUTH DAILY 90 tablet 3 04/23/2022  ? loratadine (CLARITIN) 10 MG tablet TAKE 1 TABLET (10 MG TOTAL) BY MOUTH DAILY. 30 tablet 11 04/23/2022  ? losartan (COZAAR) 50 MG tablet TAKE 1 TABLET(50 MG) BY MOUTH DAILY 90 tablet 3 04/23/2022  ? metoCLOPramide (REGLAN) 10 MG tablet Take 10 mg by mouth every 6 (six) hours as needed for nausea.   04/23/2022  ? Multiple Vitamin (MULTIVITAMIN) LIQD Take 5 mLs by mouth daily.   04/23/2022  ? ondansetron (ZOFRAN) 8 MG tablet Take 1 tablet (8 mg total) by mouth every 8 (eight) hours as needed for nausea or vomiting. 60 tablet 0 04/23/2022  ? pantoprazole (PROTONIX) 40 MG tablet TAKE 1 TABLET BY MOUTH EVERY DAY 90 tablet 1 04/23/2022  ? sucralfate (CARAFATE) 1 g tablet Take 1 tablet (1 g total) by mouth 4 (four) times daily. 120 tablet 2 04/23/2022  ? vitamin B-12 (CYANOCOBALAMIN) 1000 MCG tablet Take 1,000 mcg by mouth daily.   04/23/2022   ? ?Allergies  ?Allergen Reactions  ? Sulfa Antibiotics Nausea And Vomiting  ? ?Past Medical History:  ?Diagnosis Date  ? Arthritis   ? Atypical ductal hyperplasia of right breast 2014  ? Breast cancer ([place]) 2013  ? Esophageal spasm   ? GERD (gastroesophageal reflux disease)   ? Headache   ? migraines  ? Hypertension   ? Hypothyroidism   ? PONV (postoperative nausea and vomiting)   ? Sleep apnea   ? ?Past Surgical History:  ?Procedure Laterality Date  ? ABDOMINAL HYSTERECTOMY  09/2013  ? BREAST BIOPSY Right 12/07/2012  ? stereo biopsy - positive  ? BREAST LUMPECTOMY Right 01/01/2013  ? ADH -   ? BREAST MASS EXCISION Right 12/2012  ? BREAST SURGERY Right 12/07/2012  ? Retroareolar papilloma with 2 mm foci of ADH. Negative margins.l  ? CHOLECYSTECTOMY  1984  ? COLONOSCOPY WITH PROPOFOL N/A 01/15/2017  ? Procedure: COLONOSCOPY WITH PROPOFOL;  Surgeon: Robert Bellow, MD;  [facility] END[facility]COPY;  Service: Endoscopy;  Laterality: N/A;  ? ESOPHAGOGASTRODUODENOSCOPY (EGD) WITH PROPOFOL N/A 01/15/2017  ? Procedure: ESOPHAGOGASTRODUODENOSCOPY (EGD) WITH PROPOFOL;  Surgeon: Robert Bellow, MD;  Location: [facility] ENDOSCOPY;  Service: Endoscopy;  Laterality: N/A;  ? GASTRIC BYPASS  2004  ? KNEE ARTHROSCOPY  Right 2011, Left 2012  ? MASS EXCISION Bilateral 11/24/2015  ?  Procedure: LEFT RING FINGER AND RIGHT THUMB MASS EXCISION ;  Surgeon: Roseanne Kaufman, MD;  Location: [place];  Service: Orthopedics;  Laterality: Bilateral;  ? MM BREAST STEREO BX*L*R/S  11/2012  ? pannulectomy    ? WISDOM TOOTH EXTRACTION    ? ?Social History  ? ?Socioeconomic History  ? Marital status: Married  ?  Spouse name: Not on file  ? Number of children: Not on file  ? Years of education: Not on file  ? Highest education level: Not on file  ?Occupational History  ? Not on file  ?Tobacco Use  ? Smoking status: Never  ? Smokeless tobacco: Never  ?Vaping Use  ? Vaping Use: Never used  ?Substance and Sexual Activity  ? Alcohol use: No   ? Drug use: No  ? Sexual activity: Not on file  ?Other Topics Concern  ? Not on file  ?Social History Narrative  ? Not on file  ? ?Social Determinants of Health  ? ?Financial Resource Strain: Not on file  ?Food Insecurity: Not on file  ?Transportation Needs: Not on file  ?Physical Activity: Not on file  ?Stress: Not on file  ?Social Connections: Not on file  ?Intimate Partner Violence: Not on file  ? ?Social History  ? ?Social History Narrative  ? Not on file  ? ? ? ?ROS: Negative.  ? ? ? ?PE: ?HEENT: Negative. ?Lungs: Clear. ?Cardio: RR. ? ? ? ?Assessment/Plan: ? ?Proceed with planned upper endoscopy to assess the jejuno-jejunal anastomosis.  ?Forest Gleason Delois Tolbert ?04/24/2022 ? ?  ?

## 2022-04-24 NOTE — Anesthesia Postprocedure Evaluation (Signed)
Anesthesia Post Note ? ?Patient: Kathy Mcintosh ? ?Procedure(s) Performed: ESOPHAGOGASTRODUODENOSCOPY (EGD) WITH PROPOFOL ? ?Patient location during evaluation: Endoscopy ?Anesthesia Type: General ?Level of consciousness: awake and alert ?Pain management: pain level controlled ?Vital Signs Assessment: post-procedure vital signs reviewed and stable ?Respiratory status: spontaneous breathing, nonlabored ventilation, respiratory function stable and patient connected to nasal cannula oxygen ?Cardiovascular status: blood pressure returned to baseline and stable ?Postop Assessment: no apparent nausea or vomiting ?Anesthetic complications: no ? ? ?No notable events documented. ? ? ?Last Vitals:  ?Vitals:  ? 04/24/22 1022 04/24/22 1032  ?BP: (!) 158/82 (!) 159/92  ?Pulse: 83 71  ?Resp: 20 12  ?Temp:    ?SpO2: 98% 100%  ?  ?Last Pain:  ?Vitals:  ? 04/24/22 1032  ?TempSrc:   ?PainSc: 0-No pain  ? ? ?  ?  ?  ?  ?  ?  ? ?Arita Miss ? ? ? ? ?

## 2022-04-24 NOTE — Anesthesia Procedure Notes (Signed)
Procedure Name: [place] ?Date/Time: 04/24/2022 9:39 AM ?Performed by: Tollie Eth, CRNA ?Pre-anesthesia Checklist: Patient identified, Emergency Drugs available, Suction available and Patient being monitored ?Patient Re-evaluated:Patient Re-evaluated prior to induction ?Oxygen Delivery Method: Simple face mask ?Induction Type: IV induction ?Placement Confirmation: positive ETCO2 ? ? ? ? ?

## 2022-04-25 LAB — SURGICAL PATHOLOGY

## 2022-04-26 ENCOUNTER — Encounter: Payer: Self-pay | Admitting: General Surgery

## 2022-04-30 ENCOUNTER — Other Ambulatory Visit: Payer: Self-pay

## 2022-04-30 DIAGNOSIS — K219 Gastro-esophageal reflux disease without esophagitis: Secondary | ICD-10-CM

## 2022-04-30 MED ORDER — SUCRALFATE 1 GM/10ML PO SUSP: 1.0000 g | Freq: Three times a day (TID) | ORAL | 0 refills | Status: DC

## 2022-05-26 ENCOUNTER — Other Ambulatory Visit: Payer: Self-pay | Admitting: Family Medicine

## 2022-05-26 DIAGNOSIS — K259 Gastric ulcer, unspecified as acute or chronic, without hemorrhage or perforation: Secondary | ICD-10-CM

## 2022-05-26 DIAGNOSIS — K219 Gastro-esophageal reflux disease without esophagitis: Secondary | ICD-10-CM

## 2022-06-03 ENCOUNTER — Encounter: Payer: Self-pay | Admitting: Family Medicine

## 2022-06-03 ENCOUNTER — Ambulatory Visit (INDEPENDENT_AMBULATORY_CARE_PROVIDER_SITE_OTHER): Payer: Medicare Other | Admitting: Family Medicine

## 2022-06-03 VITALS — BP 132/80 | HR 65 | Resp 16 | Wt 231.0 lb

## 2022-06-03 DIAGNOSIS — N3281 Overactive bladder: Secondary | ICD-10-CM

## 2022-06-03 DIAGNOSIS — N6099 Unspecified benign mammary dysplasia of unspecified breast: Secondary | ICD-10-CM

## 2022-06-03 DIAGNOSIS — M5441 Lumbago with sciatica, right side: Secondary | ICD-10-CM

## 2022-06-03 DIAGNOSIS — E559 Vitamin D deficiency, unspecified: Secondary | ICD-10-CM

## 2022-06-03 DIAGNOSIS — M461 Sacroiliitis, not elsewhere classified: Secondary | ICD-10-CM

## 2022-06-03 DIAGNOSIS — N814 Uterovaginal prolapse, unspecified: Secondary | ICD-10-CM

## 2022-06-03 DIAGNOSIS — I10 Essential (primary) hypertension: Secondary | ICD-10-CM | POA: Diagnosis not present

## 2022-06-03 DIAGNOSIS — K219 Gastro-esophageal reflux disease without esophagitis: Secondary | ICD-10-CM | POA: Diagnosis not present

## 2022-06-03 DIAGNOSIS — M1990 Unspecified osteoarthritis, unspecified site: Secondary | ICD-10-CM

## 2022-06-03 DIAGNOSIS — E7800 Pure hypercholesterolemia, unspecified: Secondary | ICD-10-CM

## 2022-06-03 DIAGNOSIS — E039 Hypothyroidism, unspecified: Secondary | ICD-10-CM

## 2022-06-03 DIAGNOSIS — E538 Deficiency of other specified B group vitamins: Secondary | ICD-10-CM

## 2022-06-03 MED ORDER — CYCLOBENZAPRINE HCL 5 MG PO TABS: 5.0000 mg | ORAL_TABLET | Freq: Every day | ORAL | 5 refills | Status: DC

## 2022-06-03 MED ORDER — PREDNISONE 10 MG (21) PO TBPK: ORAL_TABLET | ORAL | 0 refills | Status: DC

## 2022-06-03 NOTE — Progress Notes (Unsigned)
Established patient visit   I,Amaia Lavallie,acting as a scribe for Wilhemena Durie, MD.,have documented all relevant documentation on the behalf of Wilhemena Durie, MD,as directed by  Wilhemena Durie, MD while in the presence of Wilhemena Durie, MD.  Patient: Kathy Mcintosh   DOB: 01/09/1956   66 y.o. Female  MRN: [id number] Visit Date: 06/03/2022  Today's healthcare provider: Wilhemena Durie, MD   Chief Complaint  Patient presents with   Back Pain   Subjective    HPI  Patient has had right-sided low back pain with sciatica. Pain occurred 5 days ago. Patient is in moderate pain. No injury mechanism.  Pain is in the region of the right SI joint and radiates a little bit down the right leg.  She wishes to try prednisone taper and get back on the Flexeril which she stopped in December.  She had MRI of her LS spine back in 2018. She has been seen by bariatric surgery for her weight and abdominal discomfort and had a B12 that was elevated and is taking ORAL B12 per their instructions.   She is retired but is keeping her infant grandchild.  Medications: Outpatient Medications Prior to Visit  Medication Sig   B COMPLEX VITAMINS PO Take by mouth.   cholecalciferol (VITAMIN D) 1000 UNITS tablet Take 1,000 Units by mouth daily.   Cimetidine (TAGAMET PO) Take by mouth.   cyclobenzaprine (FLEXERIL) 5 MG tablet TAKE 1 TABLET BY MOUTH EVERY NIGHT AT BEDTIME   estradiol (ESTRACE VAGINAL) 0.1 MG/GM vaginal cream Place 1 Applicatorful vaginally at bedtime.   ferrous sulfate 324 MG TBEC Take 324 mg by mouth.   fluticasone (FLONASE) 50 MCG/ACT nasal spray PLACE 2 SPRAYS INTO BOTH NOSTRILS DAILY.   gabapentin (NEURONTIN) 100 MG capsule TAKE 1 CAPSULE(100 MG) BY MOUTH TWICE DAILY   levothyroxine (SYNTHROID) 100 MCG tablet TAKE 1 TABLET(100 MCG) BY MOUTH DAILY   loratadine (CLARITIN) 10 MG tablet TAKE 1 TABLET (10 MG TOTAL) BY MOUTH DAILY.   losartan (COZAAR) 50 MG tablet TAKE 1  TABLET(50 MG) BY MOUTH DAILY   metoCLOPramide (REGLAN) 10 MG tablet Take 10 mg by mouth every 6 (six) hours as needed for nausea.   Multiple Vitamin (MULTIVITAMIN) LIQD Take 5 mLs by mouth daily.   ondansetron (ZOFRAN) 8 MG tablet Take 1 tablet (8 mg total) by mouth every 8 (eight) hours as needed for nausea or vomiting.   pantoprazole (PROTONIX) 40 MG tablet TAKE 1 TABLET BY MOUTH EVERY DAY   sucralfate (CARAFATE) 1 g tablet Take 1 tablet (1 g total) by mouth 4 (four) times daily.   sucralfate (CARAFATE) 1 GM/10ML suspension Take 10 mLs (1 g total) by mouth 4 (four) times daily -  with meals and at bedtime.   vitamin B-12 (CYANOCOBALAMIN) 1000 MCG tablet Take 1,000 mcg by mouth daily.   No facility-administered medications prior to visit.    Review of Systems  Constitutional:  Negative for appetite change, chills, fatigue and fever.  Respiratory:  Negative for chest tightness and shortness of breath.   Cardiovascular:  Negative for chest pain and palpitations.  Gastrointestinal:  Negative for abdominal pain, nausea and vomiting.  Neurological:  Negative for dizziness and weakness.    {Labs  Heme  Chem  Endocrine  Serology  Results Review (optional):23779}   Objective    BP 132/80 (BP Location: Right Arm, Patient Position: Sitting, Cuff Size: Large)   Pulse 65   Resp 16  Wt 231 lb (104.8 kg)   LMP 11/28/1990   SpO2 98%   BMI 36.18 kg/m  {Show previous vital signs (optional):23777}  Physical Exam  ***  No results found for any visits on 06/03/22.  Assessment & Plan     ***  No follow-ups on file.      {provider attestation***:1}   Wilhemena Durie, MD  [facility] [phone] (phone) [phone] (fax)  [place]

## 2022-07-18 ENCOUNTER — Telehealth: Payer: Self-pay | Admitting: Family Medicine

## 2022-07-18 NOTE — Telephone Encounter (Signed)
Copied from [place] [id number]. Topic: Medicare AWV >> Jul 18, 2022  2:16 PM Jae Dire wrote: Reason for CRM:  No answer unable to leave a message for patient to call back and schedule Medicare Annual Wellness Visit (AWV) in office.   If unable to come into the office for AWV,  please offer to do virtually or by telephone.  Last AWV:  07/31/2021  Please schedule at anytime with [facility] Health Advisor.  30 minute appointment for Virtual or phone 45 minute appointment for in office or Initial virtual/phone  Any questions, please contact me at [phone]

## 2022-08-28 ENCOUNTER — Telehealth: Payer: Self-pay | Admitting: Family Medicine

## 2022-08-28 NOTE — Telephone Encounter (Signed)
Copied from [place] [id number]. Topic: Medicare AWV >> Aug 28, 2022  1:27 PM Jae Dire wrote: Reason for CRM: Left message for patient to call back and schedule Medicare Annual Wellness Visit (AWV) in office.   If unable to come into the office for AWV,  please offer to do virtually or by telephone.  Last AWV: 07/31/2021  Please schedule at anytime with [facility] Health Advisor.  30 minute appointment for Virtual or phone 45 minute appointment for in office or Initial virtual/phone  Any questions, please contact me at [phone]

## 2022-09-08 ENCOUNTER — Other Ambulatory Visit: Payer: Self-pay | Admitting: Family Medicine

## 2022-09-08 DIAGNOSIS — E039 Hypothyroidism, unspecified: Secondary | ICD-10-CM

## 2022-09-09 ENCOUNTER — Telehealth: Payer: Self-pay

## 2022-09-09 NOTE — Telephone Encounter (Signed)
Copied from [place] [id number]. Topic: Appointment Scheduling - Scheduling Inquiry for Clinic >> Sep 09, 2022 11:35 AM Cyndi Bender wrote: Reason for CRM: Pt stated she would like to receive a call once her annual physical is rescheduled with another provider. Pt would like to be seen for annual physical in December if at all possible.

## 2022-09-09 NOTE — Telephone Encounter (Signed)
My chart message sent to patient stating that she can see Dr. Quentin Cornwall the same day and time of her appt. With Dr. Rosanna Randy.

## 2022-10-04 ENCOUNTER — Other Ambulatory Visit: Payer: Self-pay | Admitting: Family Medicine

## 2022-10-04 NOTE — Telephone Encounter (Signed)
Requested medication (s) are due for refill today:   Yes  Requested medication (s) are on the active medication list:   Yes  Future visit scheduled:   Yes with Dr. Quentin Cornwall in 1 mo.   Last ordered: 10/08/2021 #90, 3 refills  Returned because las are due per protocol.   Requested Prescriptions  Pending Prescriptions Disp Refills   losartan (COZAAR) 50 MG tablet [Pharmacy Med Name: LOSARTAN '50MG'$  TABLETS] 90 tablet 3    Sig: TAKE 1 TABLET(50 MG) BY MOUTH DAILY     Cardiovascular:  Angiotensin Receptor Blockers Failed - 10/04/2022  3:16 AM      Failed - Cr in normal range and within 180 days    Creatinine, Ser  Date Value Ref Range Status  03/26/2022 0.50 0.44 - 1.00 mg/dL Final         Failed - K in normal range and within 180 days    Potassium  Date Value Ref Range Status  01/31/2022 4.8 3.5 - 5.2 mmol/L Final         Passed - Patient is not pregnant      Passed - Last BP in normal range    BP Readings from Last 1 Encounters:  06/03/22 132/80         Passed - Valid encounter within last 6 months    Recent Outpatient Visits           4 months ago Acute right-sided low back pain with right-sided sciatica   [facility] Jerrol Banana., MD   8 months ago Periumbilical pain   [facility] Jerrol Banana., MD   1 year ago Medicare annual wellness visit, subsequent   [facility] Jerrol Banana., MD   1 year ago Tachycardia   [facility] Jerrol Banana., MD   1 year ago Adult hypothyroidism   [facility] Jerrol Banana., MD       Future Appointments             In 1 month Simmons-Robinson, Riki Sheer, MD [facility] [place]

## 2022-11-26 NOTE — Progress Notes (Signed)
I,Joseline E Rosas,acting as a scribe for [employer], MD.,have documented all relevant documentation on the behalf of Eulis Foster, MD,as directed by  Eulis Foster, MD while in the presence of Eulis Foster, MD.   Annual Wellness Visit     Patient: Kathy Mcintosh, Female    DOB: Apr 21, 1956, 66 y.o.   MRN: [id number] Visit Date: 11/27/2022  Today's Provider: Eulis Foster, MD   Chief Complaint  Patient presents with   Annual Exam   Subjective    Kathy Mcintosh is a 66 y.o. female who presents today for her Annual Wellness Visit.  She reports consuming a  carb  diet. Home exercise routine includes some exercise, was doing her stationary bike. She generally feels well. She reports sleeping well. She does not have additional problems to discuss today.   She reports being seen for her GYN needs with Dr Torrie Mayers at [facility]   She states she will get her mammogram scheduled later   Intermittent abdominal pain  Reports being seen at [facility] for abdominal pain after having gastric bypass  She is now having intermittent episodes of severe abdominal pain   She states that despite a high pain tolerance, she sometimes has to take pain meds  She reports the pain will sometimes go away overnight  She had been trying to do an avoidance diet but still had pain  She was told that there may be a structural problem    HPI  Vaccines recommended:  Shingrix - recommended she get at the pharmacy  Influenza - agreeable to vaccination today COVID booster- is getting mammogram soon so she would like to wait until after this before receiving this vaccine   Studies Recommended:  DEXA scan    Medications: Outpatient Medications Prior to Visit  Medication Sig   B COMPLEX VITAMINS PO Take by mouth.   calcium carbonate (OSCAL) 1500 (600 Ca) MG TABS tablet Take 600 mg of elemental calcium by mouth 3 (three) times daily with meals.    cholecalciferol (VITAMIN D) 1000 UNITS tablet Take 1,000 Units by mouth daily.   Cimetidine (TAGAMET PO) Take by mouth.   cyclobenzaprine (FLEXERIL) 5 MG tablet Take 1 tablet (5 mg total) by mouth at bedtime.   estradiol (ESTRACE VAGINAL) 0.1 MG/GM vaginal cream Place 1 Applicatorful vaginally at bedtime.   ferrous sulfate 324 MG TBEC Take 324 mg by mouth.   fluticasone (FLONASE) 50 MCG/ACT nasal spray PLACE 2 SPRAYS INTO BOTH NOSTRILS DAILY.   gabapentin (NEURONTIN) 100 MG capsule TAKE 1 CAPSULE(100 MG) BY MOUTH TWICE DAILY   levothyroxine (SYNTHROID) 100 MCG tablet TAKE 1 TABLET(100 MCG) BY MOUTH DAILY   loratadine (CLARITIN) 10 MG tablet TAKE 1 TABLET (10 MG TOTAL) BY MOUTH DAILY.   losartan (COZAAR) 50 MG tablet TAKE 1 TABLET(50 MG) BY MOUTH DAILY   metoCLOPramide (REGLAN) 10 MG tablet Take 10 mg by mouth every 6 (six) hours as needed for nausea.   Multiple Vitamin (MULTIVITAMIN) LIQD Take 5 mLs by mouth daily.   ondansetron (ZOFRAN) 8 MG tablet Take 1 tablet (8 mg total) by mouth every 8 (eight) hours as needed for nausea or vomiting.   pantoprazole (PROTONIX) 40 MG tablet TAKE 1 TABLET BY MOUTH EVERY DAY   sucralfate (CARAFATE) 1 g tablet Take 1 tablet (1 g total) by mouth 4 (four) times daily.   vitamin B-12 (CYANOCOBALAMIN) 1000 MCG tablet Take 1,000 mcg by mouth daily.   [DISCONTINUED] sucralfate (CARAFATE) 1 GM/10ML suspension Take 10  mLs (1 g total) by mouth 4 (four) times daily -  with meals and at bedtime.   [DISCONTINUED] predniSONE (STERAPRED UNI-PAK 21 TAB) 10 MG (21) TBPK tablet tAPER 6-5-4-3-2-1   No facility-administered medications prior to visit.    Allergies  Allergen Reactions   Sulfa Antibiotics Nausea And Vomiting    Patient Care Team: Eulis Foster, MD as PCP - General (Family Medicine) Bary Castilla, Forest Gleason, MD (General Surgery)  Review of Systems  Genitourinary:  Positive for dysuria and urgency.  Musculoskeletal:  Positive for back pain.  All  other systems reviewed and are negative.       Objective    Vitals: BP 135/83 (BP Location: Left Arm, Patient Position: Sitting, Cuff Size: Large)   Pulse 67   Temp 98.2 F (36.8 C) (Oral)   Resp 16   Ht _0  (1.702 m)   Wt 238 lb (108 kg)   LMP 11/28/1990   BMI 37.28 kg/m     Physical Exam Vitals reviewed.  Constitutional:      General: She is not in acute distress.    Appearance: Normal appearance. She is not ill-appearing, toxic-appearing or diaphoretic.  HENT:     Head: Normocephalic and atraumatic.     Right Ear: Tympanic membrane and external ear normal. There is no impacted cerumen.     Left Ear: Tympanic membrane and external ear normal. There is no impacted cerumen.     Nose: Nose normal.     Mouth/Throat:     Pharynx: Oropharynx is clear.  Eyes:     General: No scleral icterus.    Extraocular Movements: Extraocular movements intact.     Conjunctiva/sclera: Conjunctivae normal.     Pupils: Pupils are equal, round, and reactive to light.  Cardiovascular:     Rate and Rhythm: Normal rate and regular rhythm.     Pulses: Normal pulses.     Heart sounds: Normal heart sounds. No murmur heard.    No friction rub. No gallop.  Pulmonary:     Effort: Pulmonary effort is normal. No respiratory distress.     Breath sounds: Normal breath sounds. No wheezing, rhonchi or rales.  Abdominal:     General: A surgical scar is present. Bowel sounds are normal. There is no distension.     Palpations: Abdomen is soft. There is no mass.     Tenderness: There is no abdominal tenderness. There is no guarding.  Musculoskeletal:        General: No deformity.     Cervical back: Normal range of motion and neck supple. No rigidity.     Right lower leg: No edema.     Left lower leg: No edema.  Lymphadenopathy:     Cervical: No cervical adenopathy.  Skin:    General: Skin is warm.     Capillary Refill: Capillary refill takes less than 2 seconds.     Findings: No erythema or rash.   Neurological:     General: No focal deficit present.     Mental Status: She is alert and oriented to person, place, and time.     Motor: No weakness.     Gait: Gait normal.  Psychiatric:        Mood and Affect: Mood normal.        Behavior: Behavior normal.     Most recent functional status assessment:    11/27/2022   10:57 AM  In your present state of health, do you have any difficulty performing the  following activities:  Hearing? 0  Difficulty concentrating or making decisions? 0  Walking or climbing stairs? 0  Dressing or bathing? 0  Doing errands, shopping? 0   Most recent fall risk assessment:    11/27/2022   10:57 AM  Fall Risk   Falls in the past year? 0  Number falls in past yr: 0  Injury with Fall? 0  Risk for fall due to : No Fall Risks    Most recent depression screenings:    11/27/2022   10:57 AM 02/06/2022    1:49 PM  PHQ 2/9 Scores  PHQ - 2 Score 0 0  PHQ- 9 Score  0   Most recent cognitive screening:    07/31/2021    9:23 AM  6CIT Screen  What Year? 0 points  What month? 0 points  What time? 0 points  Count back from 20 0 points  Months in reverse 0 points  Repeat phrase 0 points  Total Score 0 points   Most recent Audit-C alcohol use screening    02/06/2022    1:50 PM  Alcohol Use Disorder Test (AUDIT)  1. How often do you have a drink containing alcohol? 0  2. How many drinks containing alcohol do you have on a typical day when you are drinking? 0  3. How often do you have six or more drinks on one occasion? 0  AUDIT-C Score 0   A score of 3 or more in women, and 4 or more in men indicates increased risk for alcohol abuse, EXCEPT if all of the points are from question 1   Results for orders placed or performed in visit on 11/27/22  Comprehensive metabolic panel  Result Value Ref Range   Glucose 98 70 - 99 mg/dL   BUN 11 8 - 27 mg/dL   Creatinine, Ser 0.57 0.57 - 1.00 mg/dL   eGFR 100 >59 mL/min/1.73   BUN/Creatinine Ratio 19 12  - 28   Sodium 136 134 - 144 mmol/L   Potassium 4.2 3.5 - 5.2 mmol/L   Chloride 100 96 - 106 mmol/L   CO2 22 20 - 29 mmol/L   Calcium 9.4 8.7 - 10.3 mg/dL   Total Protein 6.5 6.0 - 8.5 g/dL   Albumin 4.3 3.9 - 4.9 g/dL   Globulin, Total 2.2 1.5 - 4.5 g/dL   Albumin/Globulin Ratio 2.0 1.2 - 2.2   Bilirubin Total 0.6 0.0 - 1.2 mg/dL   Alkaline Phosphatase 155 (H) 44 - 121 IU/L   AST 10 0 - 40 IU/L   ALT 10 0 - 32 IU/L  TSH  Result Value Ref Range   TSH 2.240 0.450 - 4.500 uIU/mL  Vitamin B12  Result Value Ref Range   Vitamin B-12 1,623 (H) 232 - 1,245 pg/mL    Assessment & Plan     Annual wellness visit done today including the all of the following: Reviewed patient's Family Medical History Reviewed and updated list of patient's medical providers Assessment of cognitive impairment was done Assessed patient's functional ability Established a written schedule for health screening services Health Risk Assessent Completed and Reviewed  Exercise Activities and Dietary recommendations  Goals   None     Immunization History  Administered Date(s) Administered   Fluad Quad(high Dose 65+) 10/31/2020, 02/06/2022, 11/27/2022   Influenza,inj,Quad PF,6+ Mos 12/24/2018   PFIZER(Purple Top)SARS-COV-2 Vaccination 04/22/2020, 05/16/2020   PNEUMOCOCCAL CONJUGATE-20 02/06/2022   Pneumococcal Polysaccharide-23 07/27/2020   Td 09/25/2004   Tdap 04/10/2017  Health Maintenance  Topic Date Due   Zoster Vaccines- Shingrix (1 of 2) Never done   DEXA SCAN  Never done   COVID-19 Vaccine (3 - 2023-24 season) 08/16/2022   MAMMOGRAM  11/16/2023   Medicare Annual Wellness (AWV)  11/28/2023   COLONOSCOPY (Pts 45-17yr Insurance coverage will need to be confirmed)  01/15/2027   DTaP/Tdap/Td (3 - Td or Tdap) 04/11/2027   Pneumonia Vaccine 67 Years old  Completed   INFLUENZA VACCINE  Completed   Hepatitis C Screening  Completed   HPV VACCINES  Aged Out     Discussed health benefits of  physical activity, and encouraged her to engage in regular exercise appropriate for her age and condition.    Problem List Items Addressed This Visit       Cardiovascular and Mediastinum   BP (high blood pressure) - Primary    Controlled BP at goal Continue losartan 540mQD  No medications changes today  CMP ordered today        Relevant Orders   Comprehensive metabolic panel (Completed)     Endocrine   Adult hypothyroidism    Chronic, stable  TSH ordered today  Continue Synthroid 10017mdaily       Relevant Orders   TSH (Completed)     Other   B12 deficiency    Stable,chronic  Vitamin B12 ordered today         Relevant Orders   Vitamin B12 (Completed)   Epigastric pain    Requested refill of script for carafate solution  Printed script given to patient  Follow up with [facility] as previously scheduled       Postmenopausal estrogen deficiency    DEXA scan ordered today        Relevant Orders   DG Bone Density   Other Visit Diagnoses     Need for influenza vaccination       Relevant Orders   Flu Vaccine QUAD High Dose(Fluad) (Completed)        Return in about 1 year (around 11/28/2023) for Physical .     I, MakEulis FosterD, have reviewed all documentation for this visit.  Portions of this information were initially documented by the CMA and reviewed by me for thoroughness and accuracy.      MakEulis FosterD  [phone]) [phone])  [place]

## 2022-11-27 ENCOUNTER — Ambulatory Visit (INDEPENDENT_AMBULATORY_CARE_PROVIDER_SITE_OTHER): Payer: Medicare Other | Admitting: Family Medicine

## 2022-11-27 ENCOUNTER — Encounter: Payer: Self-pay | Admitting: Family Medicine

## 2022-11-27 VITALS — BP 135/83 | HR 67 | Temp 98.2°F | Resp 16 | Ht 67.0 in | Wt 238.0 lb

## 2022-11-27 DIAGNOSIS — Z23 Encounter for immunization: Secondary | ICD-10-CM

## 2022-11-27 DIAGNOSIS — Z0000 Encounter for general adult medical examination without abnormal findings: Secondary | ICD-10-CM

## 2022-11-27 DIAGNOSIS — Z780 Asymptomatic menopausal state: Secondary | ICD-10-CM

## 2022-11-27 DIAGNOSIS — I10 Essential (primary) hypertension: Secondary | ICD-10-CM

## 2022-11-27 DIAGNOSIS — R1013 Epigastric pain: Secondary | ICD-10-CM

## 2022-11-27 DIAGNOSIS — E538 Deficiency of other specified B group vitamins: Secondary | ICD-10-CM

## 2022-11-27 DIAGNOSIS — E039 Hypothyroidism, unspecified: Secondary | ICD-10-CM

## 2022-11-27 MED ORDER — SUCRALFATE 1 GM/10ML PO SUSP: 1.0000 g | Freq: Three times a day (TID) | ORAL | 2 refills | Status: AC

## 2022-11-27 NOTE — Patient Instructions (Signed)
Health Maintenance for Postmenopausal Women Menopause is a normal process in which your ability to get pregnant comes to an end. This process happens slowly over many months or years, usually between the ages of 48 and 55. Menopause is complete when you have missed your menstrual period for 12 months. It is important to talk with your health care provider about some of the most common conditions that affect women after menopause (postmenopausal women). These include heart disease, cancer, and bone loss (osteoporosis). Adopting a healthy lifestyle and getting preventive care can help to promote your health and wellness. The actions you take can also lower your chances of developing some of these common conditions. What are the signs and symptoms of menopause? During menopause, you may have the following symptoms: Hot flashes. These can be moderate or severe. Night sweats. Decrease in sex drive. Mood swings. Headaches. Tiredness (fatigue). Irritability. Memory problems. Problems falling asleep or staying asleep. Talk with your health care provider about treatment options for your symptoms. Do I need hormone replacement therapy? Hormone replacement therapy is effective in treating symptoms that are caused by menopause, such as hot flashes and night sweats. Hormone replacement carries certain risks, especially as you become older. If you are thinking about using estrogen or estrogen with progestin, discuss the benefits and risks with your health care provider. How can I reduce my risk for heart disease and stroke? The risk of heart disease, heart attack, and stroke increases as you age. One of the causes may be a change in the body's hormones during menopause. This can affect how your body uses dietary fats, triglycerides, and cholesterol. Heart attack and stroke are medical emergencies. There are many things that you can do to help prevent heart disease and stroke. Watch your blood pressure High  blood pressure causes heart disease and increases the risk of stroke. This is more likely to develop in people who have high blood pressure readings or are overweight. Have your blood pressure checked: Every 3-5 years if you are 18-39 years of age. Every year if you are 40 years old or older. Eat a healthy diet  Eat a diet that includes plenty of vegetables, fruits, low-fat dairy products, and lean protein. Do not eat a lot of foods that are high in solid fats, added sugars, or sodium. Get regular exercise Get regular exercise. This is one of the most important things you can do for your health. Most adults should: Try to exercise for at least 150 minutes each week. The exercise should increase your heart rate and make you sweat (moderate-intensity exercise). Try to do strengthening exercises at least twice each week. Do these in addition to the moderate-intensity exercise. Spend less time sitting. Even light physical activity can be beneficial. Other tips Work with your health care provider to achieve or maintain a healthy weight. Do not use any products that contain nicotine or tobacco. These products include cigarettes, chewing tobacco, and vaping devices, such as e-cigarettes. If you need help quitting, ask your health care provider. Know your numbers. Ask your health care provider to check your cholesterol and your blood sugar (glucose). Continue to have your blood tested as directed by your health care provider. Do I need screening for cancer? Depending on your health history and family history, you may need to have cancer screenings at different stages of your life. This may include screening for: Breast cancer. Cervical cancer. Lung cancer. Colorectal cancer. What is my risk for osteoporosis? After menopause, you may be   at increased risk for osteoporosis. Osteoporosis is a condition in which bone destruction happens more quickly than new bone creation. To help prevent osteoporosis or  the bone fractures that can happen because of osteoporosis, you may take the following actions: If you are 19-50 years old, get at least 1,000 mg of calcium and at least 600 international units (IU) of vitamin D per day. If you are older than age 50 but younger than age 70, get at least 1,200 mg of calcium and at least 600 international units (IU) of vitamin D per day. If you are older than age 70, get at least 1,200 mg of calcium and at least 800 international units (IU) of vitamin D per day. Smoking and drinking excessive alcohol increase the risk of osteoporosis. Eat foods that are rich in calcium and vitamin D, and do weight-bearing exercises several times each week as directed by your health care provider. How does menopause affect my mental health? Depression may occur at any age, but it is more common as you become older. Common symptoms of depression include: Feeling depressed. Changes in sleep patterns. Changes in appetite or eating patterns. Feeling an overall lack of motivation or enjoyment of activities that you previously enjoyed. Frequent crying spells. Talk with your health care provider if you think that you are experiencing any of these symptoms. General instructions See your health care provider for regular wellness exams and vaccines. This may include: Scheduling regular health, dental, and eye exams. Getting and maintaining your vaccines. These include: Influenza vaccine. Get this vaccine each year before the flu season begins. Pneumonia vaccine. Shingles vaccine. Tetanus, diphtheria, and pertussis (Tdap) booster vaccine. Your health care provider may also recommend other immunizations. Tell your health care provider if you have ever been abused or do not feel safe at home. Summary Menopause is a normal process in which your ability to get pregnant comes to an end. This condition causes hot flashes, night sweats, decreased interest in sex, mood swings, headaches, or lack  of sleep. Treatment for this condition may include hormone replacement therapy. Take actions to keep yourself healthy, including exercising regularly, eating a healthy diet, watching your weight, and checking your blood pressure and blood sugar levels. Get screened for cancer and depression. Make sure that you are up to date with all your vaccines. This information is not intended to replace advice given to you by your health care provider. Make sure you discuss any questions you have with your health care provider. Document Revised: 04/23/2021 Document Reviewed: 04/23/2021 Elsevier Patient Education  2023 Elsevier Inc.  

## 2022-11-27 NOTE — Assessment & Plan Note (Signed)
Requested refill of script for carafate solution  Printed script given to patient  Follow up with [facility] as previously scheduled

## 2022-11-28 LAB — COMPREHENSIVE METABOLIC PANEL
ALT: 10 IU/L (ref 0–32)
AST: 10 IU/L (ref 0–40)
Albumin/Globulin Ratio: 2 (ref 1.2–2.2)
Albumin: 4.3 g/dL (ref 3.9–4.9)
Alkaline Phosphatase: 155 IU/L — ABNORMAL HIGH (ref 44–121)
BUN/Creatinine Ratio: 19 (ref 12–28)
BUN: 11 mg/dL (ref 8–27)
Bilirubin Total: 0.6 mg/dL (ref 0.0–1.2)
CO2: 22 mmol/L (ref 20–29)
Calcium: 9.4 mg/dL (ref 8.7–10.3)
Chloride: 100 mmol/L (ref 96–106)
Creatinine, Ser: 0.57 mg/dL (ref 0.57–1.00)
Globulin, Total: 2.2 g/dL (ref 1.5–4.5)
Glucose: 98 mg/dL (ref 70–99)
Potassium: 4.2 mmol/L (ref 3.5–5.2)
Sodium: 136 mmol/L (ref 134–144)
Total Protein: 6.5 g/dL (ref 6.0–8.5)
eGFR: 100 mL/min/{1.73_m2} (ref >59)

## 2022-11-28 LAB — VITAMIN B12: Vitamin B-12: 1623 pg/mL — ABNORMAL HIGH (ref 232–1245)

## 2022-11-28 LAB — TSH: TSH: 2.24 u[IU]/mL (ref 0.450–4.500)

## 2022-11-29 NOTE — Assessment & Plan Note (Signed)
Stable,chronic  Vitamin B12 ordered today

## 2022-11-29 NOTE — Assessment & Plan Note (Signed)
DEXA scan ordered today 

## 2022-11-29 NOTE — Assessment & Plan Note (Addendum)
Controlled BP at goal Continue losartan '50mg'$  QD  No medications changes today  CMP ordered today

## 2022-11-29 NOTE — Assessment & Plan Note (Signed)
Chronic, stable  TSH ordered today  Continue Synthroid 168mg daily

## 2022-12-10 ENCOUNTER — Other Ambulatory Visit: Payer: Self-pay | Admitting: Family Medicine

## 2022-12-10 DIAGNOSIS — R748 Abnormal levels of other serum enzymes: Secondary | ICD-10-CM

## 2022-12-17 ENCOUNTER — Other Ambulatory Visit: Payer: Self-pay

## 2022-12-17 DIAGNOSIS — R748 Abnormal levels of other serum enzymes: Secondary | ICD-10-CM

## 2022-12-18 LAB — GAMMA GT: GGT: 8 IU/L (ref 0–60)

## 2022-12-18 LAB — COMPREHENSIVE METABOLIC PANEL
ALT: 13 IU/L (ref 0–32)
AST: 17 IU/L (ref 0–40)
Albumin/Globulin Ratio: 1.7 (ref 1.2–2.2)
Albumin: 4.3 g/dL (ref 3.9–4.9)
Alkaline Phosphatase: 168 IU/L — ABNORMAL HIGH (ref 44–121)
BUN/Creatinine Ratio: 13 (ref 12–28)
BUN: 9 mg/dL (ref 8–27)
Bilirubin Total: 0.6 mg/dL (ref 0.0–1.2)
CO2: 25 mmol/L (ref 20–29)
Calcium: 9.5 mg/dL (ref 8.7–10.3)
Chloride: 104 mmol/L (ref 96–106)
Creatinine, Ser: 0.67 mg/dL (ref 0.57–1.00)
Globulin, Total: 2.5 g/dL (ref 1.5–4.5)
Glucose: 99 mg/dL (ref 70–99)
Potassium: 4.7 mmol/L (ref 3.5–5.2)
Sodium: 143 mmol/L (ref 134–144)
Total Protein: 6.8 g/dL (ref 6.0–8.5)
eGFR: 96 mL/min/{1.73_m2} (ref >59)

## 2022-12-20 ENCOUNTER — Encounter: Payer: Self-pay | Admitting: Family Medicine

## 2022-12-25 ENCOUNTER — Other Ambulatory Visit: Payer: Self-pay | Admitting: Family Medicine

## 2022-12-25 DIAGNOSIS — Z1231 Encounter for screening mammogram for malignant neoplasm of breast: Secondary | ICD-10-CM

## 2023-01-15 ENCOUNTER — Ambulatory Visit
Admission: RE | Admit: 2023-01-15 | Discharge: 2023-01-15 | Disposition: A | Discharge status: Home / Self Care | Payer: Medicare Other | Source: Ambulatory Visit | Attending: Family Medicine | Admitting: Family Medicine

## 2023-01-15 DIAGNOSIS — Z1231 Encounter for screening mammogram for malignant neoplasm of breast: Secondary | ICD-10-CM | POA: Diagnosis present

## 2023-01-17 ENCOUNTER — Inpatient Hospital Stay: Admission: RE | Admit: 2023-01-17 | Payer: Medicare Other | Source: Ambulatory Visit

## 2023-01-17 ENCOUNTER — Telehealth: Payer: Self-pay | Admitting: Family Medicine

## 2023-01-17 DIAGNOSIS — K259 Gastric ulcer, unspecified as acute or chronic, without hemorrhage or perforation: Secondary | ICD-10-CM

## 2023-01-17 DIAGNOSIS — K219 Gastro-esophageal reflux disease without esophagitis: Secondary | ICD-10-CM

## 2023-01-17 MED ORDER — PANTOPRAZOLE SODIUM 40 MG PO TBEC: 40.0000 mg | DELAYED_RELEASE_TABLET | Freq: Every day | ORAL | 1 refills | Status: DC

## 2023-01-17 NOTE — Telephone Encounter (Signed)
[place] faxed refill request for the following medications:   pantoprazole (PROTONIX) 40 MG tablet    Please advise.

## 2023-01-21 ENCOUNTER — Ambulatory Visit: Payer: Self-pay | Admitting: *Deleted

## 2023-01-21 NOTE — Telephone Encounter (Signed)
Reviewed patient's CT imaging from April 2023 which showed small hernia and some thickening of the intestinal wall I do not see a recent lipase level that could be checked to evaluate for pancreatitis I do recommend that the patient follow-up as scheduled with gastroenterology specialist I agree with recommendation for patient to be evaluated in the emergency department if pain becomes severe, 10/10 in severity or patient is unable to tolerate oral fluids due to pain and/or vomiting  Can arrange for patient to have lipase levels checked if she is willing to come in for appointment and physical exam  Eulis Foster, MD [facility]

## 2023-01-21 NOTE — Telephone Encounter (Signed)
Per agent: " Pt has a ? about her ongoing ab pain   Pt is being treated for abdominal pain by Dr Quentin Cornwall and had another bad bout last nite and wanted the nurse to fu with her to maybe ask Dr Quentin Cornwall a question or if nurse could answer. [phone]"      Chief Complaint: Abdominal Pain Symptoms: Above belly button, radiates to back. States has had pain for years S/P gastric bypass, "Worse since Thursday," 10/10 at times, brief duration, eases off. States last night 10/10 lasted longer and radiated to back, nausea Frequency: "For years since Bypass." Pertinent Negatives: Patient denies  Disposition: '[]'$ ED /'[]'$ Urgent Care (no appt availability in office) / '[]'$ Appointment(In office/virtual)/ '[]'$  [facility]/ '[]'$ Home Care/ '[]'$ Refused Recommended Disposition /'[]'$ [facility]/ '[x]'$  Follow-up with PCP Additional Notes:  States has appts with GI and [facility] where Bypass surgery was performed. "GI thinks the pain is from something structural but can't see it, so sending to [facility]."  Pt declines appt, questions if Dr. Rosita Fire thinks the pain could be from pancreatitis. Requesting review of previous labs and questioning other labs/tests could be done to R/O pancreatitis. Did advise ED if 10/10 reoccurs. Verbalizes understanding. Please advise. CB# [phone] Reason for Disposition  Abdominal pain is a chronic symptom (recurrent or ongoing AND present > 4 weeks)  Answer Assessment - Initial Assessment Questions 1. LOCATION: "Where does it hurt?"      "Above belly button." 2. RADIATION: "Does the pain shoot anywhere else?" (e.g., chest, back)     To back 3. ONSET: "When did the pain begin?" (e.g., minutes, hours or days ago)      Occurred last night, none presently 4. SUDDEN: "Gradual or sudden onset?"     Gradually 5. PATTERN "Does the pain come and go, or is it constant?"    - If it comes and goes: "How long does it last?" "Do you have pain now?"     (Note: Comes and goes means the  pain is intermittent. It goes away completely between bouts.)    - If constant: "Is it getting better, staying the same, or getting worse?"      (Note: Constant means the pain never goes away completely; most serious pain is constant and gets worse.)      Comes and goes 6. SEVERITY: "How bad is the pain?"  (e.g., Scale 1-10; mild, moderate, or severe)    - MILD (1-3): Doesn't interfere with normal activities, abdomen soft and not tender to touch.     - MODERATE (4-7): Interferes with normal activities or awakens from sleep, abdomen tender to touch.     - SEVERE (8-10): Excruciating pain, doubled over, unable to do any normal activities.       1/10 presently. Periods of 10/10 since Thursday 7. RECURRENT SYMPTOM: "Have you ever had this type of stomach pain before?" If Yes, ask: "When was the last time?" and "What happened that time?"      Yes, years, worsening 8. CAUSE: "What do you think is causing the stomach pain?"     Unsure 9. RELIEVING/AGGRAVATING FACTORS: "What makes it better or worse?" (e.g., antacids, bending or twisting motion, bowel movement)     no 10. OTHER SYMPTOMS: "Do you have any other symptoms?" (e.g., back pain, diarrhea, fever, urination pain, vomiting)       nausea  Protocols used: Abdominal Pain - Female-A-AH

## 2023-01-22 NOTE — Telephone Encounter (Signed)
LMTCB-Ok for [facility] Nurse to advise 

## 2023-01-28 NOTE — Telephone Encounter (Signed)
FYI-Patient hasn't returned the call. Patient scheduled to see GI tomorrow.

## 2023-01-31 ENCOUNTER — Encounter: Payer: Self-pay | Admitting: Family Medicine

## 2023-02-03 ENCOUNTER — Other Ambulatory Visit: Payer: Self-pay | Admitting: Family Medicine

## 2023-02-03 DIAGNOSIS — K219 Gastro-esophageal reflux disease without esophagitis: Secondary | ICD-10-CM

## 2023-02-03 NOTE — Telephone Encounter (Unsigned)
Copied from [place] [id number]. Topic: General - Other >> Feb 03, 2023  1:21 PM Everette C wrote: Reason for CRM: Medication Refill - Medication: sucralfate (CARAFATE) 1 g tablet [id number]  Has the patient contacted their pharmacy? Yes.   (Agent: If no, request that the patient contact the pharmacy for the refill. If patient does not wish to contact the pharmacy document the reason why and proceed with request.) (Agent: If yes, when and what did the pharmacy advise?)  Preferred Pharmacy (with phone number or street name): [facility] [id number], [place] [address] Phone: [phone] Fax: [phone] Hours: Not open 24 hours   Has the patient been seen for an appointment in the last year OR does the patient have an upcoming appointment? Yes.    Agent: Please be advised that RX refills may take up to 3 business days. We ask that you follow-up with your pharmacy.

## 2023-02-04 ENCOUNTER — Ambulatory Visit
Admission: RE | Admit: 2023-02-04 | Discharge: 2023-02-04 | Disposition: A | Discharge status: Home / Self Care | Payer: Medicare Other | Source: Ambulatory Visit | Attending: Family Medicine | Admitting: Family Medicine

## 2023-02-04 DIAGNOSIS — Z780 Asymptomatic menopausal state: Secondary | ICD-10-CM

## 2023-02-04 NOTE — Telephone Encounter (Signed)
Requested medication (s) are due for refill today - yes  Requested medication (s) are on the active medication list -yes  Future visit scheduled -no  Last refill: Carafate tablet 04/18/22                                Liquid 11/26/22  Notes to clinic: Please review for most current form of medication   Requested Prescriptions  Pending Prescriptions Disp Refills   sucralfate (CARAFATE) 1 g tablet 120 tablet 2    Sig: Take 1 tablet (1 g total) by mouth 4 (four) times daily.     Gastroenterology: Antiacids Passed - 02/03/2023  2:54 PM      Passed - Valid encounter within last 12 months    Recent Outpatient Visits           2 months ago Primary hypertension   [place], [place], MD   8 months ago Acute right-sided low back pain with right-sided sciatica   [facility] Eulas Post, MD   12 months ago Periumbilical pain   [place] Eulas Post, MD   1 year ago Medicare annual wellness visit, subsequent   [place] Eulas Post, MD   1 year ago [place], [place]                 Requested Prescriptions  Pending Prescriptions Disp Refills   sucralfate (CARAFATE) 1 g tablet 120 tablet 2    Sig: Take 1 tablet (1 g total) by mouth 4 (four) times daily.     Gastroenterology: Antiacids Passed - 02/03/2023  2:54 PM      Passed - Valid encounter within last 12 months    Recent Outpatient Visits           2 months ago Primary hypertension   [facility] [place], [place], MD   8 months ago Acute right-sided low back pain with right-sided sciatica   [facility] Eulas Post, MD   12 months ago Periumbilical pain   [place] Eulas Post, MD   1 year ago Medicare annual  wellness visit, subsequent   [place] Eulas Post, MD   1 year ago [place] Eulas Post, MD

## 2023-02-06 MED ORDER — SUCRALFATE 1 G PO TABS: 1.0000 g | ORAL_TABLET | Freq: Four times a day (QID) | ORAL | 2 refills | Status: AC

## 2023-02-12 ENCOUNTER — Ambulatory Visit: Payer: Self-pay | Admitting: *Deleted

## 2023-02-12 DIAGNOSIS — M5441 Lumbago with sciatica, right side: Secondary | ICD-10-CM

## 2023-02-12 MED ORDER — PREDNISONE 20 MG PO TABS: ORAL_TABLET | ORAL | 0 refills | Status: AC

## 2023-02-12 NOTE — Telephone Encounter (Signed)
Prescribed 10 day course of prednisone taper to end by 02/21/23 before her surgery date  Eulis Foster, MD  [facility]

## 2023-02-12 NOTE — Telephone Encounter (Signed)
Patient advised. Verbalized understanding and states she will f/u in office after she f/u with surgical office  FYI

## 2023-02-12 NOTE — Telephone Encounter (Signed)
Reason for Disposition  [1] MODERATE back pain (e.g., interferes with normal activities) AND [2] present > 3 days  Answer Assessment - Initial Assessment Questions 1. ONSET: "When did the pain begin?"      I'm having back pain.    Went to ED on 01/26/2023 because my stomach was hurting.     Wed. Or Thur. My pain in   Lower back pain  started again.   'Sunday my back was hurting bad.   I\'m taking the Flexeril 5 mg.   The line disconnected at this point.    I called her back. I\'m for stomach surgery in 2 weeks.   I was wondering if I can have a rx of prednisone for my back.   I\'m for surgery on the 14th on my stomach.      I can\'t drive.    My husband can\'t drive either.        Can I get a shot in my back?    Would Dr Simmons-Robinson be willing to call in prednisone for me.     2. LOCATION: "Where does it hurt?" (upper, mid or lower back)     My lower back.   This has been a problem for a long time.        3. SEVERITY: "How bad is the pain?"  (e.g., Scale 1-10; mild, moderate, or severe)   - MILD (1-3): Doesn\'t interfere with normal activities.    - MODERATE (4-7): Interferes with normal activities or awakens from sleep.    - SEVERE (8-10): Excruciating pain, unable to do any normal activities.      Severe pain 4. PATTERN: "Is the pain constant?" (e.g., yes, no; constant, intermittent)      Constant 5. RADIATION: "Does the pain shoot into your legs or somewhere else?"     No  6. CAUSE:  "What do you think is causing the back pain?"      I have back problems that flares up intermittently 7. BACK OVERUSE:  "Any recent lifting of heavy objects, strenuous work or exercise?"     no 8. MEDICINES: "What have you taken so far for the pain?" (e.g., nothing, acetaminophen, NSAIDS)     Maybe increase my Flexeril to 10 mg.   I\'m taking 5 mg now.    I also take gabapentin.     I have oxycodone.    I\'ll keep using the Tylenol and Flexeril  9. NEUROLOGIC SYMPTOMS: "Do you have any weakness, numbness,  or problems with bowel/bladder control?"     No problems.    10. OTHER SYMPTOMS: "Do you have any other symptoms?" (e.g., fever, abdomen pain, burning with urination, blood in urine)       I\'m for stomach surgery on 02/27/2023.     11'$ . PREGNANCY: "Is there any chance you are pregnant?" "When was your last menstrual period?"       N/A due to age  Protocols used: Back Pain-A-AH

## 2023-02-12 NOTE — Telephone Encounter (Signed)
  Chief Complaint: Lower back pain.   Requesting prednisone.     She is for stomach surgery on 02/27/2023 and the back pain is really bad right now.   This is a chronic problem that flares up about every year. Symptoms: Lower back pain that is not better with Flexeril 5 mg, heat or stretches.   She nor her husband can drive so can't come in for a visit.   No virtual visits available until March 5. Frequency: Back pain started on Wednesday. Pertinent Negatives: Patient denies radiation or pain down either leg.    Disposition: []$ ED /[]$ Urgent Care (no appt availability in office) / []$ Appointment(In office/virtual)/ []$  [facility]/ []$ Home Care/ []$ Refused Recommended Disposition /[]$  Mobile Bus/ [x]$  Follow-up with PCP Additional Notes: Message sent to Dr. Alba Cory with her request for prednisone for her back if it's ok to take before stomach surgery on 02/27/2023.

## 2023-02-12 NOTE — Addendum Note (Signed)
Addended by: Adolph Pollack on: 02/12/2023 11:42 AM   Modules accepted: Orders

## 2023-02-21 ENCOUNTER — Encounter: Payer: Self-pay | Admitting: Physician Assistant

## 2023-02-21 ENCOUNTER — Ambulatory Visit (INDEPENDENT_AMBULATORY_CARE_PROVIDER_SITE_OTHER): Payer: Medicare Other | Admitting: Physician Assistant

## 2023-02-21 VITALS — BP 139/72 | HR 77 | Wt 240.0 lb

## 2023-02-21 DIAGNOSIS — N3000 Acute cystitis without hematuria: Secondary | ICD-10-CM | POA: Diagnosis not present

## 2023-02-21 DIAGNOSIS — R300 Dysuria: Secondary | ICD-10-CM

## 2023-02-21 MED ORDER — NITROFURANTOIN MONOHYD MACRO 100 MG PO CAPS: 100.0000 mg | ORAL_CAPSULE | Freq: Two times a day (BID) | ORAL | 0 refills | Status: AC

## 2023-02-21 NOTE — Progress Notes (Signed)
I,Sha'taria Tyson,acting as a Education administrator for [employer], PA-C.,have documented all relevant documentation on the behalf of Mikey Kirschner, PA-C,as directed by  Mikey Kirschner, PA-C while in the presence of Mikey Kirschner, PA-C.   Established patient visit   Patient: Kathy Mcintosh   DOB: Feb 21, 1956   67 y.o. Female  MRN: [id number] Visit Date: 02/21/2023  Today's healthcare provider: Mikey Kirschner, PA-C   Cc. Flank pain, dysuria x 3-5 days  Subjective    HPI  Urinary symptoms  She reports new onset right sided flank pain, urinary hesitancy, and burning sensation when urinating . The flank pain started x 3 days ago. The current episode started a few days ago and is gradually worsening. Patient states symptoms are 8/10 in intensity, occurring constantly. She  has not been recently treated for similar symptoms. Pt reports she did an at home UTI test that was positive. She has been taking AZO over the counter.   Pt has a suspected intussusception of jejunum has is scheduled next week to have ex lap surgery with GI.   Associated symptoms: No abdominal pain Yes back pain  No chills No constipation  No cramping No diarrhea  No discharge No fever  No hematuria Yes nausea  No vomiting    ---------------------------------------------------------------------------------------   Medications: Outpatient Medications Prior to Visit  Medication Sig   B COMPLEX VITAMINS PO Take by mouth.   calcium carbonate (OSCAL) 1500 (600 Ca) MG TABS tablet Take 600 mg of elemental calcium by mouth 3 (three) times daily with meals.   cholecalciferol (VITAMIN D) 1000 UNITS tablet Take 1,000 Units by mouth daily.   Cimetidine (TAGAMET PO) Take by mouth.   cyclobenzaprine (FLEXERIL) 5 MG tablet Take 1 tablet (5 mg total) by mouth at bedtime.   estradiol (ESTRACE VAGINAL) 0.1 MG/GM vaginal cream Place 1 Applicatorful vaginally at bedtime.   ferrous sulfate 324 MG TBEC Take 324 mg by mouth.   fluticasone  (FLONASE) 50 MCG/ACT nasal spray PLACE 2 SPRAYS INTO BOTH NOSTRILS DAILY.   gabapentin (NEURONTIN) 100 MG capsule TAKE 1 CAPSULE(100 MG) BY MOUTH TWICE DAILY   levothyroxine (SYNTHROID) 100 MCG tablet TAKE 1 TABLET(100 MCG) BY MOUTH DAILY   loratadine (CLARITIN) 10 MG tablet TAKE 1 TABLET (10 MG TOTAL) BY MOUTH DAILY.   losartan (COZAAR) 50 MG tablet TAKE 1 TABLET(50 MG) BY MOUTH DAILY   metoCLOPramide (REGLAN) 10 MG tablet Take 10 mg by mouth every 6 (six) hours as needed for nausea.   Multiple Vitamin (MULTIVITAMIN) LIQD Take 5 mLs by mouth daily.   ondansetron (ZOFRAN) 8 MG tablet Take 1 tablet (8 mg total) by mouth every 8 (eight) hours as needed for nausea or vomiting.   oxyCODONE (OXY IR/ROXICODONE) 5 MG immediate release tablet Take by mouth.   pantoprazole (PROTONIX) 40 MG tablet Take 1 tablet (40 mg total) by mouth daily.   predniSONE (DELTASONE) 20 MG tablet Take 1 tablet (20 mg total) by mouth daily with breakfast for 5 days, THEN 0.5 tablets (10 mg total) daily with breakfast for 5 days.   sucralfate (CARAFATE) 1 g tablet Take 1 tablet (1 g total) by mouth 4 (four) times daily.   sucralfate (CARAFATE) 1 GM/10ML suspension Take 10 mLs (1 g total) by mouth 4 (four) times daily -  with meals and at bedtime.   vitamin B-12 (CYANOCOBALAMIN) 1000 MCG tablet Take 1,000 mcg by mouth daily.   pantoprazole (PROTONIX) 40 MG tablet Take by mouth. (Patient not taking: Reported  on 02/21/2023)   No facility-administered medications prior to visit.    Review of Systems  Constitutional:  Negative for fatigue and fever.  Respiratory:  Negative for cough and shortness of breath.   Cardiovascular:  Negative for chest pain and leg swelling.  Gastrointestinal:  Positive for abdominal pain.  Genitourinary:  Positive for difficulty urinating and dysuria.  Neurological:  Negative for dizziness and headaches.      Objective    BP 139/72   Pulse 77   Wt 240 lb (108.9 kg)   LMP 11/28/1990   SpO2 100%    BMI 37.59 kg/m  Blood pressure 139/72, pulse 77, weight 240 lb (108.9 kg), last menstrual period 11/28/1990, SpO2 100 %.   Physical Exam Constitutional:      General: She is awake.     Appearance: She is well-developed.  HENT:     Head: Normocephalic.  Eyes:     Conjunctiva/sclera: Conjunctivae normal.  Cardiovascular:     Rate and Rhythm: Normal rate and regular rhythm.     Heart sounds: Normal heart sounds.  Pulmonary:     Effort: Pulmonary effort is normal.     Breath sounds: Normal breath sounds.  Abdominal:     General: Abdomen is flat. There is no distension.     Palpations: Abdomen is soft.     Tenderness: There is abdominal tenderness in the right lower quadrant and suprapubic area. There is no guarding.  Skin:    General: Skin is warm.  Neurological:     Mental Status: She is alert and oriented to person, place, and time.  Psychiatric:        Attention and Perception: Attention normal.        Mood and Affect: Mood normal.        Speech: Speech normal.        Behavior: Behavior is cooperative.     No results found for any visits on 02/21/23.  Assessment & Plan     Acute cystitis UA today -- pt unable to void. May bring back sample. Ordered culture in case she returns to urinate. Increase fluids Rx macrobid bid x 5 days  Return if symptoms worsen or fail to improve.      I, Mikey Kirschner, PA-C have reviewed all documentation for this visit. The documentation on  02/21/23 for the exam, diagnosis, procedures, and orders are all accurate and complete.  Mikey Kirschner, PA-C [facility] [address] [place], [place], [address] Office: [phone] Fax: [phone]

## 2023-02-25 LAB — URINE CULTURE

## 2023-03-03 ENCOUNTER — Telehealth: Payer: Self-pay

## 2023-03-03 ENCOUNTER — Other Ambulatory Visit: Payer: Self-pay

## 2023-03-03 ENCOUNTER — Telehealth: Payer: Self-pay | Admitting: Family Medicine

## 2023-03-03 MED ORDER — GABAPENTIN 100 MG PO CAPS: 100.0000 mg | ORAL_CAPSULE | Freq: Two times a day (BID) | ORAL | 12 refills | Status: DC

## 2023-03-03 NOTE — Transitions of Care (Post Inpatient/ED Visit) (Signed)
   03/03/2023  Name: Kathy Mcintosh MRN: [id number] DOB: February 04, 1956  Today's TOC FU Call Status: Today's TOC FU Call Status:: Successful TOC FU Call Competed TOC FU Call Complete Date: 03/03/23  Transition Care Management Follow-up Telephone Call Date of Discharge: 02/28/23 Discharge Facility: Other ([facility]) Name of Other (Non[facility]) Discharge Facility: [facility] Type of Discharge: Inpatient Admission Primary Inpatient Discharge Diagnosis:: "gen abd pain" How have you been since you were released from the hospital?: Better (Patient reports she is doing well-she has had minimal pain since abd surgery-has not had to take Oxycodone. Appetitie is good-she is trying to increase protein to 60mg  /day. She has had a BM since returning home.) Any questions or concerns?: No  Items Reviewed: Did you receive and understand the discharge instructions provided?: Yes Medications obtained and verified?: Yes (Medications Reviewed) Any new allergies since your discharge?: No Dietary orders reviewed?: Yes Type of Diet Ordered:: as tolerated Do you have support at home?: Yes People in Home: spouse Name of Support/Comfort Primary Source: [facility] and Equipment/Supplies: [place] Ordered?: NA Any new equipment or medical supplies ordered?: NA  Functional Questionnaire: Do you need assistance with bathing/showering or dressing?: No Do you need assistance with meal preparation?: No Do you need assistance with eating?: No Do you have difficulty maintaining continence: No Do you need assistance with getting out of bed/getting out of a chair/moving?: No Do you have difficulty managing or taking your medications?: No  Follow up appointments reviewed: PCP Follow-up appointment confirmed?: [facility] Follow-up appointment confirmed?: Yes Date of Specialist follow-up appointment?: 03/28/23 Follow-Up Specialty Provider:: Dr. Rennis Golden Do you need  transportation to your follow-up appointment?: No Do you understand care options if your condition(s) worsen?: Yes-patient verbalized understanding  SDOH Interventions Today    Flowsheet Row Most Recent Value  SDOH Interventions   Food Insecurity Interventions Intervention Not Indicated  Transportation Interventions Intervention Not Indicated      TOC Interventions Today    Flowsheet Row Most Recent Value  TOC Interventions   TOC Interventions Discussed/Reviewed TOC Interventions Discussed, Post discharge activity limitations per provider, Post op wound/incision care      Interventions Today    Flowsheet Row Most Recent Value  General Interventions   General Interventions Discussed/Reviewed General Interventions Discussed, Doctor Visits  Doctor Visits Discussed/Reviewed PCP  PCP/Specialist Visits Compliance with follow-up visit  [pt states she will make PCP appt after she has followed up with surgeon]  Education Interventions   Education Provided Provided Education  Provided Verbal Education On Nutrition, When to see the doctor, Medication, Other  Nutrition Interventions   Nutrition Discussed/Reviewed Nutrition Discussed, Adding fruits and vegetables, Increaing proteins  Pharmacy Interventions   Pharmacy Dicussed/Reviewed Pharmacy Topics Discussed, Medications and their functions  Safety Interventions   Safety Discussed/Reviewed Safety Discussed        Hetty Blend [facility] Care Management Care Management Community Coordinator Direct Phone: [phone] Toll Free: [phone] Fax: [phone]

## 2023-03-03 NOTE — Telephone Encounter (Signed)
LOV 02/21/23 NOV none LRF 02/12/22 60 x 12

## 2023-03-03 NOTE — Telephone Encounter (Signed)
[facility] faxed refill request for the following medications:   gabapentin (NEURONTIN) 100 MG capsule    Please advise

## 2023-04-21 DIAGNOSIS — K289 Gastrojejunal ulcer, unspecified as acute or chronic, without hemorrhage or perforation: Secondary | ICD-10-CM | POA: Insufficient documentation

## 2023-04-21 DIAGNOSIS — Z9884 Bariatric surgery status: Secondary | ICD-10-CM | POA: Insufficient documentation

## 2023-08-28 ENCOUNTER — Encounter: Payer: Self-pay | Admitting: Family Medicine

## 2023-08-28 ENCOUNTER — Ambulatory Visit (INDEPENDENT_AMBULATORY_CARE_PROVIDER_SITE_OTHER): Payer: Medicare Other | Admitting: Family Medicine

## 2023-08-28 VITALS — BP 138/82 | HR 62 | Ht 67.0 in | Wt 221.6 lb

## 2023-08-28 DIAGNOSIS — E039 Hypothyroidism, unspecified: Secondary | ICD-10-CM

## 2023-08-28 DIAGNOSIS — K219 Gastro-esophageal reflux disease without esophagitis: Secondary | ICD-10-CM | POA: Diagnosis not present

## 2023-08-28 DIAGNOSIS — I10 Essential (primary) hypertension: Secondary | ICD-10-CM | POA: Diagnosis not present

## 2023-08-28 DIAGNOSIS — K259 Gastric ulcer, unspecified as acute or chronic, without hemorrhage or perforation: Secondary | ICD-10-CM

## 2023-08-28 DIAGNOSIS — M545 Low back pain, unspecified: Secondary | ICD-10-CM | POA: Diagnosis not present

## 2023-08-28 DIAGNOSIS — E538 Deficiency of other specified B group vitamins: Secondary | ICD-10-CM

## 2023-08-28 DIAGNOSIS — M461 Sacroiliitis, not elsewhere classified: Secondary | ICD-10-CM

## 2023-08-28 DIAGNOSIS — E559 Vitamin D deficiency, unspecified: Secondary | ICD-10-CM

## 2023-08-28 DIAGNOSIS — E7800 Pure hypercholesterolemia, unspecified: Secondary | ICD-10-CM

## 2023-08-28 DIAGNOSIS — G8929 Other chronic pain: Secondary | ICD-10-CM

## 2023-08-28 MED ORDER — GABAPENTIN 100 MG PO CAPS: 300.0000 mg | ORAL_CAPSULE | Freq: Three times a day (TID) | ORAL | Status: DC | PRN

## 2023-08-28 MED ORDER — CYCLOBENZAPRINE HCL 5 MG PO TABS: 5.0000 mg | ORAL_TABLET | Freq: Every day | ORAL | 2 refills | Status: DC

## 2023-08-28 MED ORDER — GABAPENTIN 100 MG PO CAPS: 300.0000 mg | ORAL_CAPSULE | Freq: Every day | ORAL | 1 refills | Status: DC

## 2023-08-28 MED ORDER — PANTOPRAZOLE SODIUM 40 MG PO TBEC: 40.0000 mg | DELAYED_RELEASE_TABLET | Freq: Every day | ORAL | Status: AC

## 2023-08-28 NOTE — Progress Notes (Signed)
Established patient visit   Patient: Kathy Mcintosh   DOB: 1956/08/07   67 y.o. Female  MRN: [id number] Visit Date: 08/28/2023  Today's healthcare provider: Ronnald Ramp, MD   Chief Complaint  Patient presents with   Medical Management of Chronic Issues    Low Back pain, Pain returned one week ago today, not constant but does appear when in a certain position or twist wrong    Subjective     HPI     Medical Management of Chronic Issues    Additional comments: Low Back pain, Pain returned one week ago today, not constant but does appear when in a certain position or twist wrong       Last edited by Rolly Salter, CMA on 08/28/2023 10:01 AM.       Discussed the use of AI scribe software for clinical note transcription with the patient, who gave verbal consent to proceed.  History of Present Illness   The patient, with a history of abdominal pain and bunion formation, presented with a recent history of bowel obstruction that required surgical intervention. The obstruction was due to a tear in the lower part of the abdomen, causing the bowel to slide inside itself and through the tear, leading to intermittent obstructions. The patient reported that the frequency and duration of these episodes were increasing, with pain levels reaching a scale of 20.  Post-surgery, the patient reported a significant improvement, with no recurrence of the obstruction. However, she did report a recent episode of intense stomach pain, which she managed with salsalate. The patient also underwent an endoscopy, which revealed an "ugly ulcer," leading to a prescription of pantoprazole twice daily.  In addition to these issues, the patient has been dealing with weight loss, and has been seeing a nutritionist. She reported a low-carb, high-protein diet, with a daily protein intake of 100 grams. Despite the diet, the patient reported feeling constantly hungry.  The patient also reported a  recent episode of back pain, which she attributed to a sudden movement. The pain was localized to the left side of the back and was described as a pinched nerve. The patient managed the pain with cyclobenzaprine at bedtime.  Lastly, the patient reported swelling and pain in her feet, specifically around the bunion area. The pain was described as being in between the bunions and was severe enough to prevent the patient from wearing shoes. She expressed a desire to consult with a podiatrist regarding this issue.         Past Medical History:  Diagnosis Date   Arthritis    Atypical ductal hyperplasia of right breast 2014   Breast cancer (HCC) 2013   Esophageal spasm    GERD (gastroesophageal reflux disease)    Headache    migraines   Hypertension    Hypothyroidism    PONV (postoperative nausea and vomiting)    Sleep apnea     Medications: Outpatient Medications Prior to Visit  Medication Sig   calcium carbonate (OSCAL) 1500 (600 Ca) MG TABS tablet Take 600 mg of elemental calcium by mouth 3 (three) times daily with meals.   Cimetidine (TAGAMET PO) Take by mouth.   estradiol (ESTRACE VAGINAL) 0.1 MG/GM vaginal cream Place 1 Applicatorful vaginally at bedtime.   ferrous sulfate 324 MG TBEC Take 324 mg by mouth.   fluticasone (FLONASE) 50 MCG/ACT nasal spray PLACE 2 SPRAYS INTO BOTH NOSTRILS DAILY.   levothyroxine (SYNTHROID) 100 MCG tablet TAKE 1 TABLET(100  MCG) BY MOUTH DAILY   lipase/protease/amylase (CREON) 36000 UNITS CPEP capsule Take 2 capsules by mouth daily. Take 2 capsules with every meal   loratadine (CLARITIN) 10 MG tablet TAKE 1 TABLET (10 MG TOTAL) BY MOUTH DAILY.   losartan (COZAAR) 50 MG tablet TAKE 1 TABLET(50 MG) BY MOUTH DAILY   metoCLOPramide (REGLAN) 10 MG tablet Take 10 mg by mouth every 6 (six) hours as needed for nausea.   NON FORMULARY 1 tablet by Mouth Rinse route daily. Bariatric multivitamin - Take one tablet by mouth daily   ondansetron (ZOFRAN) 8 MG tablet  Take 1 tablet (8 mg total) by mouth every 8 (eight) hours as needed for nausea or vomiting.   sucralfate (CARAFATE) 1 g tablet Take 1 tablet (1 g total) by mouth 4 (four) times daily.   sucralfate (CARAFATE) 1 GM/10ML suspension Take 10 mLs (1 g total) by mouth 4 (four) times daily -  with meals and at bedtime.   [DISCONTINUED] cyclobenzaprine (FLEXERIL) 5 MG tablet Take 1 tablet (5 mg total) by mouth at bedtime.   [DISCONTINUED] gabapentin (NEURONTIN) 100 MG capsule Take 1 capsule (100 mg total) by mouth 2 (two) times daily. (Patient taking differently: Take 100 mg by mouth 2 (two) times daily. Pt states taking 300mg  q8hrs prn per surgeon/post op instructions)   [DISCONTINUED] pantoprazole (PROTONIX) 40 MG tablet Take 1 tablet (40 mg total) by mouth daily. (Patient taking differently: Take 40 mg by mouth daily. Pt states taking BID)   [DISCONTINUED] pantoprazole (PROTONIX) 40 MG tablet Take by mouth.   [DISCONTINUED] B COMPLEX VITAMINS PO Take by mouth. (Patient not taking: Reported on 08/28/2023)   [DISCONTINUED] cholecalciferol (VITAMIN D) 1000 UNITS tablet Take 1,000 Units by mouth daily. (Patient not taking: Reported on 08/28/2023)   [DISCONTINUED] Multiple Vitamin (MULTIVITAMIN) LIQD Take 5 mLs by mouth daily. (Patient not taking: Reported on 08/28/2023)   [DISCONTINUED] oxyCODONE (OXY IR/ROXICODONE) 5 MG immediate release tablet Take by mouth.   [DISCONTINUED] vitamin B-12 (CYANOCOBALAMIN) 1000 MCG tablet Take 1,000 mcg by mouth daily. (Patient not taking: Reported on 08/28/2023)   No facility-administered medications prior to visit.    Review of Systems      Objective    BP 138/82 (BP Location: Left Arm, Patient Position: Sitting, Cuff Size: Large)   Pulse 62   Ht 5\' 7"  (1.702 m)   Wt 221 lb 9.6 oz (100.5 kg)   LMP 11/28/1990   SpO2 99%   BMI 34.71 kg/m  BP Readings from Last 3 Encounters:  08/28/23 138/82  02/21/23 139/72  11/27/22 135/83   Wt Readings from Last 3 Encounters:   08/28/23 221 lb 9.6 oz (100.5 kg)  02/21/23 240 lb (108.9 kg)  11/27/22 238 lb (108 kg)       Physical Exam Vitals reviewed.  Constitutional:      General: She is not in acute distress.    Appearance: Normal appearance. She is not ill-appearing, toxic-appearing or diaphoretic.  Eyes:     Conjunctiva/sclera: Conjunctivae normal.  Cardiovascular:     Rate and Rhythm: Normal rate and regular rhythm.     Pulses: Normal pulses.     Heart sounds: Normal heart sounds. No murmur heard.    No friction rub. No gallop.  Pulmonary:     Effort: Pulmonary effort is normal. No respiratory distress.     Breath sounds: Normal breath sounds. No stridor. No wheezing, rhonchi or rales.  Abdominal:     General: A surgical scar is present. Bowel  sounds are normal. There is no distension.     Palpations: Abdomen is soft.     Tenderness: There is no abdominal tenderness.  Musculoskeletal:     Lumbar back: Tenderness present. No deformity or signs of trauma.     Right lower leg: No edema.     Left lower leg: Edema present.  Skin:    Findings: No erythema or rash.  Neurological:     Mental Status: She is alert and oriented to person, place, and time.       No results found for any visits on 08/28/23.  Assessment & Plan     Problem List Items Addressed This Visit     Acid reflux   Relevant Medications   lipase/protease/amylase (CREON) 36000 UNITS CPEP capsule   pantoprazole (PROTONIX) 40 MG tablet   B12 deficiency   BP (high blood pressure)   Hypercholesteremia   Hypothyroidism, unspecified   Vitamin D deficiency, unspecified   Other Visit Diagnoses     Chronic bilateral low back pain without sciatica    -  Primary   Relevant Medications   gabapentin (NEURONTIN) 100 MG capsule   cyclobenzaprine (FLEXERIL) 5 MG tablet   Gastric ulcer without hemorrhage or perforation, unspecified chronicity       Relevant Medications   pantoprazole (PROTONIX) 40 MG tablet   Sacroiliitis (HCC)        Relevant Medications   cyclobenzaprine (FLEXERIL) 5 MG tablet       Assessment and Plan    Postoperative Intussusception Patient underwent surgery in March for bowel obstruction due to intussusception. No current symptoms of obstruction. -Continue current management.  Peptic Ulcer Disease Patient reports a history of a "ugly ulcer" diagnosed via endoscopy. Currently on Pantoprazole twice daily. Recent abdominal pain may be related to ulcer. -Continue Pantoprazole twice daily. -Plan for repeat endoscopy in January.  Bunion Patient reports pain and swelling related to bunions on both feet. -Refer to podiatrist for further evaluation and management.  Back Pain Patient reports recent onset of back pain, likely musculoskeletal in nature. -Increase Gabapentin to 300mg  at bedtime. -Continue Cyclobenzaprine 5mg  at bedtime as needed for pain.  Hypertension Well-controlled on Losartan 50mg  daily. -Continue Losartan 50mg  daily.  General Health Maintenance -Continue current vitamin regimen including bariatric vitamin, iron, and calcium. -Plan for flu shot. -Follow-up in 4 months.         Return in about 4 months (around 12/28/2023) for CHRONIC F/U.         Ronnald Ramp, MD  [facility] [phone] (phone) [phone] (fax)  [facility]

## 2023-08-29 ENCOUNTER — Telehealth: Payer: Self-pay | Admitting: Family Medicine

## 2023-08-29 NOTE — Telephone Encounter (Signed)
Pt stated Dr. Neita Garnet sent in a new prescription with new dose for medication gabapentin (NEURONTIN) 100 MG capsule but the pharmacy stated they don't have it.  I advised pt I would send a message to the provider. Pt asked that I wait and said she was going to call the pharmacy back and reach back out to the office if this was still an issue.

## 2023-09-01 ENCOUNTER — Other Ambulatory Visit: Payer: Self-pay | Admitting: Family Medicine

## 2023-09-01 DIAGNOSIS — E039 Hypothyroidism, unspecified: Secondary | ICD-10-CM

## 2023-09-23 ENCOUNTER — Telehealth: Payer: Self-pay

## 2023-09-23 NOTE — Telephone Encounter (Signed)
Copied from CRM [id number]. Topic: General - Inquiry >> Sep 23, 2023  9:41 AM Haroldine Laws wrote: Reason for CRM: pt called asking about the dosing on her gabapentin.  She would like clarification .  CB#  [phone]

## 2023-09-24 ENCOUNTER — Telehealth: Payer: Medicare Other | Admitting: Physician Assistant

## 2023-09-24 DIAGNOSIS — R3989 Other symptoms and signs involving the genitourinary system: Secondary | ICD-10-CM | POA: Diagnosis not present

## 2023-09-24 MED ORDER — CEPHALEXIN 500 MG PO CAPS: 500.0000 mg | ORAL_CAPSULE | Freq: Two times a day (BID) | ORAL | 0 refills | Status: AC

## 2023-09-24 NOTE — Progress Notes (Signed)
Virtual Visit Consent   Kathy Mcintosh, you are scheduled for a virtual visit with a [facility] provider today. Just as with appointments in the office, your consent must be obtained to participate. Your consent will be active for this visit and any virtual visit you may have with one of our providers in the next 365 days. If you have a MyChart account, a copy of this consent can be sent to you electronically.  As this is a virtual visit, video technology does not allow for your provider to perform a traditional examination. This may limit your provider's ability to fully assess your condition. If your provider identifies any concerns that need to be evaluated in person or the need to arrange testing (such as labs, EKG, etc.), we will make arrangements to do so. Although advances in technology are sophisticated, we cannot ensure that it will always work on either your end or our end. If the connection with a video visit is poor, the visit may have to be switched to a telephone visit. With either a video or telephone visit, we are not always able to ensure that we have a secure connection.  By engaging in this virtual visit, you consent to the provision of healthcare and authorize for your insurance to be billed (if applicable) for the services provided during this visit. Depending on your insurance coverage, you may receive a charge related to this service.  I need to obtain your verbal consent now. Are you willing to proceed with your visit today? Kathy Mcintosh has provided verbal consent on 09/24/2023 for a virtual visit (video or telephone). Piedad Climes, [place]  Date: 09/24/2023 10:17 AM  Virtual Visit via Video Note   I, Piedad Climes, connected with  Kathy Mcintosh  ([id number], [birth year]) on 09/24/23 at 10:15 AM EDT by a video-enabled telemedicine application and verified that I am speaking with the correct person using two identifiers.  Location: Patient: Virtual Visit Location  Patient: Home Provider: Virtual Visit Location Provider: Home Office   I discussed the limitations of evaluation and management by telemedicine and the availability of in person appointments. The patient expressed understanding and agreed to proceed.    History of Present Illness: Kathy Mcintosh is a 67 y.o. who identifies as a female who was assigned female at birth, and is being seen today for urinary symptoms starting last night with some malaise, chills followed by dysuria, urgency and frequency. Notes cloudy urine this morning with odor. Denies hematuria. Denies fever. Denies vomiting but notes mild nausea with lower back pain. Took home UTI test + LE, nitrites.   HPI: HPI  Problems:  Patient Active Problem List   Diagnosis Date Noted   Marginal ulcer 04/21/2023   S/P gastric bypass 04/21/2023   Postmenopausal estrogen deficiency 11/27/2022   Spinal stenosis of lumbar region with neurogenic claudication 11/14/2017   Epigastric pain 01/06/2017   Arthritis 10/25/2015   Age-related memory disorder 10/25/2015   Bladder cystocele 10/25/2015   Hypercholesteremia 10/25/2015   Cannot sleep 10/25/2015   Headache, migraine 10/25/2015   Detrusor muscle hypertonia 10/25/2015   B12 deficiency 10/25/2015   Acid reflux 09/21/2013   BP (high blood pressure) 09/21/2013   Adiposity 09/21/2013   Uterovaginal prolapse 07/23/2013   Atypical ductal hyperplasia, breast 06/22/2013   Vitamin D deficiency, unspecified 11/28/2012   Hypothyroidism, unspecified 11/28/2012    Allergies:  Allergies  Allergen Reactions   Sulfa Antibiotics Nausea And Vomiting   Medications:  Current  Outpatient Medications:    cephALEXin (KEFLEX) 500 MG capsule, Take 1 capsule (500 mg total) by mouth 2 (two) times daily for 7 days., Disp: 14 capsule, Rfl: 0   calcium carbonate (OSCAL) 1500 (600 Ca) MG TABS tablet, Take 600 mg of elemental calcium by mouth 3 (three) times daily with meals., Disp: , Rfl:    Cimetidine  (TAGAMET PO), Take by mouth., Disp: , Rfl:    cyclobenzaprine (FLEXERIL) 5 MG tablet, Take 1 tablet (5 mg total) by mouth at bedtime., Disp: 30 tablet, Rfl: 2   estradiol (ESTRACE VAGINAL) 0.1 MG/GM vaginal cream, Place 1 Applicatorful vaginally at bedtime., Disp: 42.5 g, Rfl: 12   ferrous sulfate 324 MG TBEC, Take 324 mg by mouth., Disp: , Rfl:    fluticasone (FLONASE) 50 MCG/ACT nasal spray, PLACE 2 SPRAYS INTO BOTH NOSTRILS DAILY., Disp: 16 g, Rfl: 12   gabapentin (NEURONTIN) 100 MG capsule, Take 3 capsules (300 mg total) by mouth at bedtime., Disp: 60 capsule, Rfl: 1   levothyroxine (SYNTHROID) 100 MCG tablet, TAKE 1 TABLET(100 MCG) BY MOUTH DAILY, Disp: 90 tablet, Rfl: 3   lipase/protease/amylase (CREON) 36000 UNITS CPEP capsule, Take 2 capsules by mouth daily. Take 2 capsules with every meal, Disp: , Rfl:    loratadine (CLARITIN) 10 MG tablet, TAKE 1 TABLET (10 MG TOTAL) BY MOUTH DAILY., Disp: 30 tablet, Rfl: 11   losartan (COZAAR) 50 MG tablet, TAKE 1 TABLET(50 MG) BY MOUTH DAILY, Disp: 90 tablet, Rfl: 3   metoCLOPramide (REGLAN) 10 MG tablet, Take 10 mg by mouth every 6 (six) hours as needed for nausea., Disp: , Rfl:    NON FORMULARY, 1 tablet by Mouth Rinse route daily. Bariatric multivitamin - Take one tablet by mouth daily, Disp: , Rfl:    ondansetron (ZOFRAN) 8 MG tablet, Take 1 tablet (8 mg total) by mouth every 8 (eight) hours as needed for nausea or vomiting., Disp: 60 tablet, Rfl: 0   pantoprazole (PROTONIX) 40 MG tablet, Take 1 tablet (40 mg total) by mouth daily. Pt states taking BID, Disp: , Rfl:    sucralfate (CARAFATE) 1 g tablet, Take 1 tablet (1 g total) by mouth 4 (four) times daily., Disp: 120 tablet, Rfl: 2   sucralfate (CARAFATE) 1 GM/10ML suspension, Take 10 mLs (1 g total) by mouth 4 (four) times daily -  with meals and at bedtime., Disp: 420 mL, Rfl: 2  Observations/Objective: Patient is well-developed, well-nourished in no acute distress.  Resting comfortably at  home.  Head is normocephalic, atraumatic.  No labored breathing. Speech is clear and coherent with logical content.  Patient is alert and oriented at baseline.   Assessment and Plan: 1. Suspected UTI - cephALEXin (KEFLEX) 500 MG capsule; Take 1 capsule (500 mg total) by mouth 2 (two) times daily for 7 days.  Dispense: 14 capsule; Refill: 0  Classic UTI symptoms with absence of alarm signs or symptoms. Prior history of UTI. Will treat empirically with Keflex for suspected uncomplicated cystitis. Supportive measures and OTC medications reviewed. Strict in-person evaluation precautions discussed.    Follow Up Instructions: I discussed the assessment and treatment plan with the patient. The patient was provided an opportunity to ask questions and all were answered. The patient agreed with the plan and demonstrated an understanding of the instructions.  A copy of instructions were sent to the patient via MyChart unless otherwise noted below.   The patient was advised to call back or seek an in-person evaluation if the symptoms worsen or  if the condition fails to improve as anticipated.    Piedad Climes, PA-C

## 2023-09-24 NOTE — Patient Instructions (Signed)
Mercy Riding, thank you for joining Piedad Climes, PA-C for today's virtual visit.  While this provider is not your primary care provider (PCP), if your PCP is located in our provider database this encounter information will be shared with them immediately following your visit.   A [facility] MyChart account gives you access to today's visit and all your visits, tests, and labs performed at [facility] " click here if you don't have a [facility] MyChart account or go to [email or web address]  Consent: (Patient) Kathy Mcintosh provided verbal consent for this virtual visit at the beginning of the encounter.  Current Medications:  Current Outpatient Medications:    calcium carbonate (OSCAL) 1500 (600 Ca) MG TABS tablet, Take 600 mg of elemental calcium by mouth 3 (three) times daily with meals., Disp: , Rfl:    Cimetidine (TAGAMET PO), Take by mouth., Disp: , Rfl:    cyclobenzaprine (FLEXERIL) 5 MG tablet, Take 1 tablet (5 mg total) by mouth at bedtime., Disp: 30 tablet, Rfl: 2   estradiol (ESTRACE VAGINAL) 0.1 MG/GM vaginal cream, Place 1 Applicatorful vaginally at bedtime., Disp: 42.5 g, Rfl: 12   ferrous sulfate 324 MG TBEC, Take 324 mg by mouth., Disp: , Rfl:    fluticasone (FLONASE) 50 MCG/ACT nasal spray, PLACE 2 SPRAYS INTO BOTH NOSTRILS DAILY., Disp: 16 g, Rfl: 12   gabapentin (NEURONTIN) 100 MG capsule, Take 3 capsules (300 mg total) by mouth at bedtime., Disp: 60 capsule, Rfl: 1   levothyroxine (SYNTHROID) 100 MCG tablet, TAKE 1 TABLET(100 MCG) BY MOUTH DAILY, Disp: 90 tablet, Rfl: 3   lipase/protease/amylase (CREON) 36000 UNITS CPEP capsule, Take 2 capsules by mouth daily. Take 2 capsules with every meal, Disp: , Rfl:    loratadine (CLARITIN) 10 MG tablet, TAKE 1 TABLET (10 MG TOTAL) BY MOUTH DAILY., Disp: 30 tablet, Rfl: 11   losartan (COZAAR) 50 MG tablet, TAKE 1 TABLET(50 MG) BY MOUTH DAILY, Disp: 90 tablet, Rfl: 3   metoCLOPramide (REGLAN) 10 MG tablet,  Take 10 mg by mouth every 6 (six) hours as needed for nausea., Disp: , Rfl:    NON FORMULARY, 1 tablet by Mouth Rinse route daily. Bariatric multivitamin - Take one tablet by mouth daily, Disp: , Rfl:    ondansetron (ZOFRAN) 8 MG tablet, Take 1 tablet (8 mg total) by mouth every 8 (eight) hours as needed for nausea or vomiting., Disp: 60 tablet, Rfl: 0   pantoprazole (PROTONIX) 40 MG tablet, Take 1 tablet (40 mg total) by mouth daily. Pt states taking BID, Disp: , Rfl:    sucralfate (CARAFATE) 1 g tablet, Take 1 tablet (1 g total) by mouth 4 (four) times daily., Disp: 120 tablet, Rfl: 2   sucralfate (CARAFATE) 1 GM/10ML suspension, Take 10 mLs (1 g total) by mouth 4 (four) times daily -  with meals and at bedtime., Disp: 420 mL, Rfl: 2   Medications ordered in this encounter:  No orders of the defined types were placed in this encounter.    *If you need refills on other medications prior to your next appointment, please contact your pharmacy*  Follow-Up: Call back or seek an in-person evaluation if the symptoms worsen or if the condition fails to improve as anticipated.  [facility] [phone]  Other Instructions Your symptoms are consistent with a bladder infection, also called acute cystitis. Please take your antibiotic (Keflex) as directed until all pills are gone.  Stay very well hydrated.  Consider a daily probiotic (  Align, Culturelle, or Activia) to help prevent stomach upset caused by the antibiotic.  Taking a probiotic daily may also help prevent recurrent UTIs.  Also consider taking AZO (Phenazopyridine) tablets to help decrease pain with urination.    Urinary Tract Infection A urinary tract infection (UTI) can occur any place along the urinary tract. The tract includes the kidneys, ureters, bladder, and urethra. A type of germ called bacteria often causes a UTI. UTIs are often helped with antibiotic medicine.  HOME CARE  If given, take antibiotics as told by your  doctor. Finish them even if you start to feel better. Drink enough fluids to keep your pee (urine) clear or pale yellow. Avoid tea, drinks with caffeine, and bubbly (carbonated) drinks. Pee often. Avoid holding your pee in for a long time. Pee before and after having sex (intercourse). Wipe from front to back after you poop (bowel movement) if you are a woman. Use each tissue only once. GET HELP RIGHT AWAY IF:  You have back pain. You have lower belly (abdominal) pain. You have chills. You feel sick to your stomach (nauseous). You throw up (vomit). Your burning or discomfort with peeing does not go away. You have a fever. Your symptoms are not better in 3 days. MAKE SURE YOU:  Understand these instructions. Will watch your condition. Will get help right away if you are not doing well or get worse. Document Released: 05/20/2008 Document Revised: 08/26/2012 Document Reviewed: 07/02/2012 [facility] Patient Information 2015 [place], [place]. This information is not intended to replace advice given to you by your health care provider. Make sure you discuss any questions you have with your health care provider.    If you have been instructed to have an in-person evaluation today at a local Urgent Care facility, please use the link below. It will take you to a list of all of our available [facility], including address, phone number and hours of operation. Please do not delay care.  [facility]  If you or a family member do not have a primary care provider, use the link below to schedule a visit and establish care. When you choose a [facility] primary care physician or advanced practice provider, you gain a long-term partner in health. Find a Primary Care Provider  Learn more about [facility]'s in-office and virtual care options: [facility] - Get Care Now

## 2023-09-24 NOTE — Telephone Encounter (Signed)
Unable to leave VM. CRM created.Ok for [facility] to advise/clarify if patient returns call

## 2023-11-07 ENCOUNTER — Other Ambulatory Visit: Payer: Self-pay | Admitting: Family Medicine

## 2023-11-07 NOTE — Telephone Encounter (Signed)
Current Gabapentin prescription reflects taking 3 capsules at bedtime but the quantity was for 60 which is only a 20 day supply.  Prescription updated to reflect correct quantity (90 capsultes) for one month's supply.

## 2023-11-07 NOTE — Telephone Encounter (Signed)
This pt stopped by and states that Dr. Roxan Hockey changed her  gabapentin (NEURONTIN) 100 MG capsule 3 at bedtime equaling 300mg .  She states that the directions are correct but the quantity is not enough to last a month.  She is requesting the quantity be changed and sent to [facility] in [place].  She is out of medication

## 2024-01-12 ENCOUNTER — Ambulatory Visit (INDEPENDENT_AMBULATORY_CARE_PROVIDER_SITE_OTHER): Payer: Medicare Other | Admitting: Family Medicine

## 2024-01-12 ENCOUNTER — Encounter: Payer: Self-pay | Admitting: Family Medicine

## 2024-01-12 VITALS — BP 132/70 | HR 65 | Ht 67.0 in | Wt 218.5 lb

## 2024-01-12 DIAGNOSIS — M48062 Spinal stenosis, lumbar region with neurogenic claudication: Secondary | ICD-10-CM | POA: Diagnosis not present

## 2024-01-12 DIAGNOSIS — E7800 Pure hypercholesterolemia, unspecified: Secondary | ICD-10-CM

## 2024-01-12 DIAGNOSIS — I10 Essential (primary) hypertension: Secondary | ICD-10-CM | POA: Diagnosis not present

## 2024-01-12 DIAGNOSIS — Z9884 Bariatric surgery status: Secondary | ICD-10-CM

## 2024-01-12 DIAGNOSIS — E039 Hypothyroidism, unspecified: Secondary | ICD-10-CM

## 2024-01-12 NOTE — Assessment & Plan Note (Signed)
Chronic Due for updated lipid panel. Last cholesterol panel was in 2021. - Order lipid panel - continue atorvastatin 40mg  daily

## 2024-01-12 NOTE — Assessment & Plan Note (Signed)
Chronic Managed with Synthroid. Last TSH in 2023 was 2.24. - Continue Synthroid 100 mcg daily - Order TSH, T4, T3

## 2024-01-12 NOTE — Assessment & Plan Note (Signed)
Chronic  Intermittent back pain managed with gabapentin and cyclobenzaprine. Recent medication adjustment due to disorientation. Current regimen effective with alternating medications. - Continue current regimen of gabapentin 100mg  and cyclobenzaprine 5mg  once daily as needed

## 2024-01-12 NOTE — Assessment & Plan Note (Signed)
Hypertension is well-controlled with current medication regimen. Blood pressure today is 132/70 mmHg. Patient reports improvement attributed to increased activity from returning to work. Chronic, at goal  - Continue losartan 50 mg daily - CMP collected today

## 2024-01-12 NOTE — Assessment & Plan Note (Signed)
Recent flare-up of stomach pain in November, managed with dietary modifications. Currently on pantoprazole 40 mg twice daily. Plans to discuss further management with gastroenterologist on February 14th. - Continue pantoprazole 40 mg twice daily - Follow up with gastroenterologist on February 14th

## 2024-01-12 NOTE — Assessment & Plan Note (Signed)
Chronic  Continues to follow with bariatric surgery clinic at [facility]  Maintaining weight loss, continues  to follow with nutritionist

## 2024-01-12 NOTE — Progress Notes (Signed)
Established patient visit   Patient: Kathy Mcintosh   DOB: Jan 05, 1956   68 y.o. Female  MRN: [id number] Visit Date: 01/12/2024  Today's healthcare provider: Ronnald Ramp, MD   Chief Complaint  Patient presents with   Medical Management of Chronic Issues    4 month f/u   Subjective     HPI     Medical Management of Chronic Issues    Additional comments: 4 month f/u      Last edited by Bevelyn Ngo, CMA on 01/12/2024  9:54 AM.       Discussed the use of AI scribe software for clinical note transcription with the patient, who gave verbal consent to proceed.  History of Present Illness   The patient, a 68 year old individual with a history of primary hypertension, acquired hypothyroidism, and hypercholesterolemia, presents for a routine follow-up. She is currently on losartan 50 mg daily for hypertension, pantoprazole 40 mg twice daily for gastric reflux, and 100 mcg of Synthroid daily for hypothyroidism. The patient's blood pressure is well controlled, and recent lab results indicate stable cholesterol and thyroid levels.  The patient recently returned to work and reports an improvement in her blood pressure, which she attributes to increased activity. However, she experienced initial discomfort in her feet and back due to the transition from working at home to a more active job. The patient was prescribed gabapentin and cyclobenzaprine for back pain, which initially caused disorientation but has since been adjusted and is now well-tolerated.  The patient also reports a flare-up of stomach pain, which she managed by adjusting her diet. She plans to discuss this with her gastroenterologist at an upcoming appointment. The patient is also followed by a bariatric specialist and a nutritionist, and she has been successful in weight management through dietary changes and increased physical activity at work.  The patient also mentions a financial concern related to her  medical bills, suspecting that her supplemental insurance may not be covering out-of-network services. She plans to address this issue with her insurance provider.         Past Medical History:  Diagnosis Date   Arthritis    Atypical ductal hyperplasia of right breast 2014   Breast cancer (HCC) 2013   Esophageal spasm    GERD (gastroesophageal reflux disease)    Headache    migraines   Hypertension    Hypothyroidism    PONV (postoperative nausea and vomiting)    Sleep apnea     Medications: Outpatient Medications Prior to Visit  Medication Sig   calcium carbonate (OSCAL) 1500 (600 Ca) MG TABS tablet Take 600 mg of elemental calcium by mouth 3 (three) times daily with meals.   Cimetidine (TAGAMET PO) Take by mouth.   cyclobenzaprine (FLEXERIL) 5 MG tablet Take 1 tablet (5 mg total) by mouth at bedtime.   estradiol (ESTRACE VAGINAL) 0.1 MG/GM vaginal cream Place 1 Applicatorful vaginally at bedtime.   ferrous sulfate 324 MG TBEC Take 324 mg by mouth.   fluticasone (FLONASE) 50 MCG/ACT nasal spray PLACE 2 SPRAYS INTO BOTH NOSTRILS DAILY.   gabapentin (NEURONTIN) 100 MG capsule TAKE 3 CAPSULES(300 MG) BY MOUTH AT BEDTIME   levothyroxine (SYNTHROID) 100 MCG tablet TAKE 1 TABLET(100 MCG) BY MOUTH DAILY   lipase/protease/amylase (CREON) 36000 UNITS CPEP capsule Take 2 capsules by mouth daily. Take 2 capsules with every meal   loratadine (CLARITIN) 10 MG tablet TAKE 1 TABLET (10 MG TOTAL) BY MOUTH DAILY.   losartan (COZAAR)  50 MG tablet TAKE 1 TABLET(50 MG) BY MOUTH DAILY   metoCLOPramide (REGLAN) 10 MG tablet Take 10 mg by mouth every 6 (six) hours as needed for nausea.   NON FORMULARY 1 tablet by Mouth Rinse route daily. Bariatric multivitamin - Take one tablet by mouth daily   ondansetron (ZOFRAN) 8 MG tablet Take 1 tablet (8 mg total) by mouth every 8 (eight) hours as needed for nausea or vomiting.   pantoprazole (PROTONIX) 40 MG tablet Take 1 tablet (40 mg total) by mouth daily. Pt  states taking BID   sucralfate (CARAFATE) 1 g tablet Take 1 tablet (1 g total) by mouth 4 (four) times daily.   sucralfate (CARAFATE) 1 GM/10ML suspension Take 10 mLs (1 g total) by mouth 4 (four) times daily -  with meals and at bedtime.   No facility-administered medications prior to visit.    Review of Systems  Last CBC Lab Results  Component Value Date   WBC 6.6 07/17/2021   HGB 13.4 07/17/2021   HCT 38.9 07/17/2021   MCV 89 07/17/2021   MCH 30.8 07/17/2021   RDW 12.9 07/17/2021   PLT 219 07/17/2021   Last metabolic panel Lab Results  Component Value Date   GLUCOSE 99 12/17/2022   NA 143 12/17/2022   K 4.7 12/17/2022   CL 104 12/17/2022   CO2 25 12/17/2022   BUN 9 12/17/2022   CREATININE 0.67 12/17/2022   EGFR 96 12/17/2022   CALCIUM 9.5 12/17/2022   PROT 6.8 12/17/2022   ALBUMIN 4.3 12/17/2022   LABGLOB 2.5 12/17/2022   AGRATIO 1.7 12/17/2022   BILITOT 0.6 12/17/2022   ALKPHOS 168 (H) 12/17/2022   AST 17 12/17/2022   ALT 13 12/17/2022   Last lipids Lab Results  Component Value Date   CHOL 167 11/07/2020   HDL 57 11/07/2020   LDLCALC 92 11/07/2020   TRIG 96 11/07/2020   CHOLHDL 2.9 11/07/2020   Last hemoglobin A1c No results found for: "HGBA1C" Last thyroid functions Lab Results  Component Value Date   TSH 2.240 11/27/2022        Objective    BP 132/70   Pulse 65   Ht 5\' 7"  (1.702 m)   Wt 218 lb 8 oz (99.1 kg)   LMP 11/28/1990   SpO2 98%   BMI 34.22 kg/m  BP Readings from Last 3 Encounters:  01/12/24 132/70  08/28/23 138/82  02/21/23 139/72   Wt Readings from Last 3 Encounters:  01/12/24 218 lb 8 oz (99.1 kg)  08/28/23 221 lb 9.6 oz (100.5 kg)  02/21/23 240 lb (108.9 kg)        Physical Exam  General: Alert, no acute distress Neck: no thyromegaly, no thyroid tenderness, no nodules palpated  Cardio: Normal S1 and S2, RRR, no r/m/g Pulm: CTAB, normal work of breathing   No results found for any visits on 01/12/24.   Assessment & Plan     Problem List Items Addressed This Visit       Cardiovascular and Mediastinum   BP (high blood pressure) - Primary   Hypertension is well-controlled with current medication regimen. Blood pressure today is 132/70 mmHg. Patient reports improvement attributed to increased activity from returning to work. Chronic, at goal  - Continue losartan 50 mg daily - CMP collected today       Relevant Orders   CMP14+EGFR     Endocrine   Hypothyroidism, unspecified   Chronic Managed with Synthroid. Last TSH in 2023 was 2.24. -  Continue Synthroid 100 mcg daily - Order TSH, T4, T3      Relevant Orders   TSH+T4F+T3Free     Other   Spinal stenosis of lumbar region with neurogenic claudication   Chronic  Intermittent back pain managed with gabapentin and cyclobenzaprine. Recent medication adjustment due to disorientation. Current regimen effective with alternating medications. - Continue current regimen of gabapentin 100mg  and cyclobenzaprine 5mg  once daily as needed      S/P gastric bypass   Chronic  Continues to follow with bariatric surgery clinic at [facility]  Maintaining weight loss, continues  to follow with nutritionist       Hypercholesteremia   Chronic Due for updated lipid panel. Last cholesterol panel was in 2021. - Order lipid panel - continue atorvastatin 40mg  daily       Relevant Orders   Lipid panel      General Health Maintenance Patient is generally well and active. Weight management ongoing with current weight of 212 lbs. Reports successful weight loss and adherence to dietary recommendations. - Order complete metabolic panel - Schedule wellness visit in May  Follow-up - Follow up with bariatrics and gynecology on February 17th - Schedule lab work and complete fasting labs between 8-11:30 AM or 1-4:30 PM.         Return in about 15 weeks (around 04/26/2024) for AWV.         Ronnald Ramp, MD  [facility] [phone] (phone) [phone] (fax)  [facility]

## 2024-01-16 ENCOUNTER — Other Ambulatory Visit: Payer: Self-pay | Admitting: Family Medicine

## 2024-01-23 ENCOUNTER — Encounter: Payer: Self-pay | Admitting: Family Medicine

## 2024-01-23 LAB — LIPID PANEL
Chol/HDL Ratio: 2.5 {ratio} (ref 0.0–4.4)
Cholesterol, Total: 162 mg/dL (ref 100–199)
HDL: 64 mg/dL (ref >39)
LDL Chol Calc (NIH): 83 mg/dL (ref 0–99)
Triglycerides: 80 mg/dL (ref 0–149)
VLDL Cholesterol Cal: 15 mg/dL (ref 5–40)

## 2024-01-23 LAB — CMP14+EGFR
ALT: 12 [IU]/L (ref 0–32)
AST: 16 [IU]/L (ref 0–40)
Albumin: 4 g/dL (ref 3.9–4.9)
Alkaline Phosphatase: 170 [IU]/L — ABNORMAL HIGH (ref 44–121)
BUN/Creatinine Ratio: 36 — ABNORMAL HIGH (ref 12–28)
BUN: 21 mg/dL (ref 8–27)
Bilirubin Total: 0.5 mg/dL (ref 0.0–1.2)
CO2: 26 mmol/L (ref 20–29)
Calcium: 9.6 mg/dL (ref 8.7–10.3)
Chloride: 103 mmol/L (ref 96–106)
Creatinine, Ser: 0.58 mg/dL (ref 0.57–1.00)
Globulin, Total: 2.3 g/dL (ref 1.5–4.5)
Glucose: 90 mg/dL (ref 70–99)
Potassium: 4.4 mmol/L (ref 3.5–5.2)
Sodium: 141 mmol/L (ref 134–144)
Total Protein: 6.3 g/dL (ref 6.0–8.5)
eGFR: 99 mL/min/{1.73_m2} (ref >59)

## 2024-01-23 LAB — TSH+T4F+T3FREE
Free T4: 1.17 ng/dL (ref 0.82–1.77)
T3, Free: 3 pg/mL (ref 2.0–4.4)
TSH: 3.12 u[IU]/mL (ref 0.450–4.500)

## 2024-02-06 IMAGING — US US ABDOMEN COMPLETE
1 series · 13 of 25 positions shown · non-contrast
Comparison: None.

CLINICAL DATA: History of cholecystectomy and gastric bypass with
generalized abdominal pain x3 months.

EXAM:
ABDOMEN ULTRASOUND COMPLETE

[Series 1: us abdomen complete · 0.22mm/px · 13 of 83 slices shown]
[im 1/83]
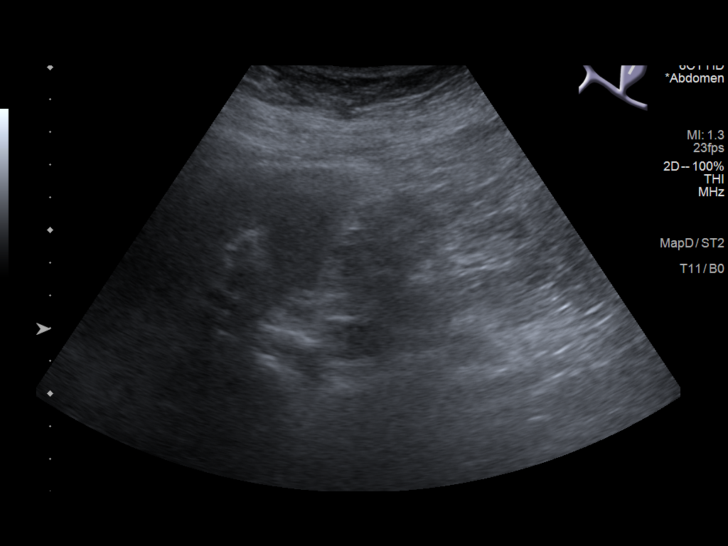
[im 7/83]
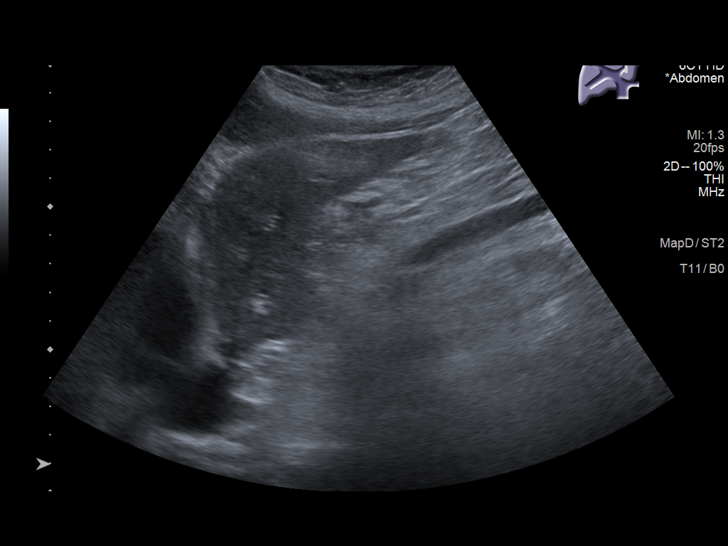
[im 14/83]
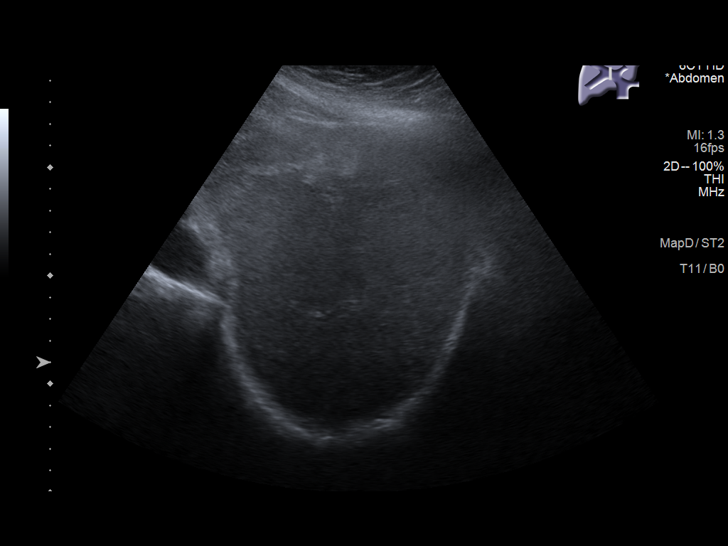
[im 21/83]
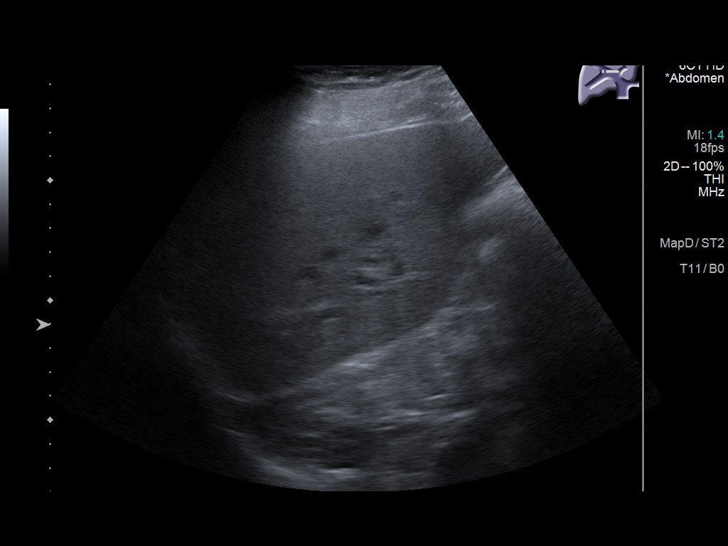
[im 28/83]
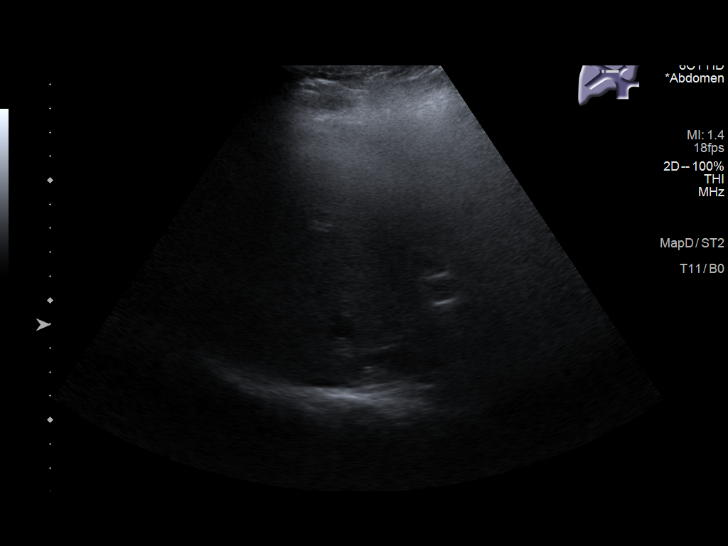
[im 35/83]
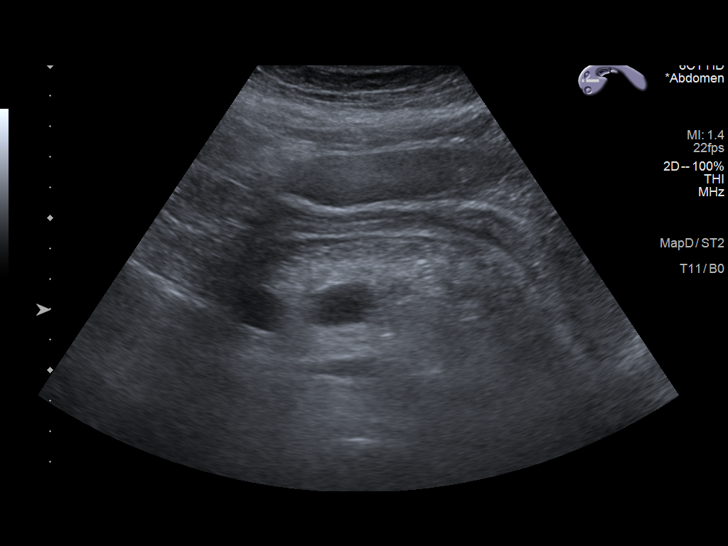
[im 42/83]
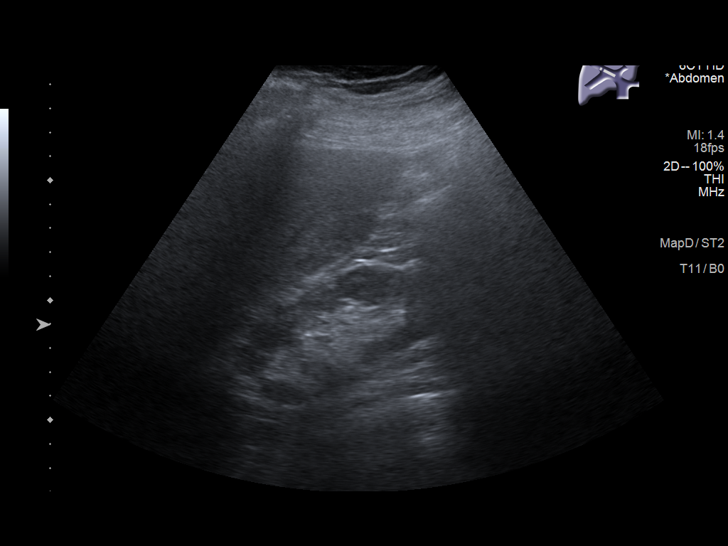
[im 48/83]
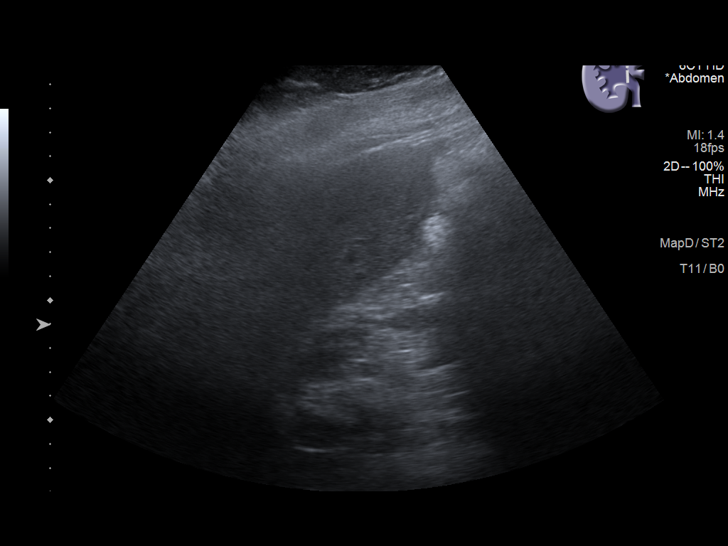
[im 55/83]
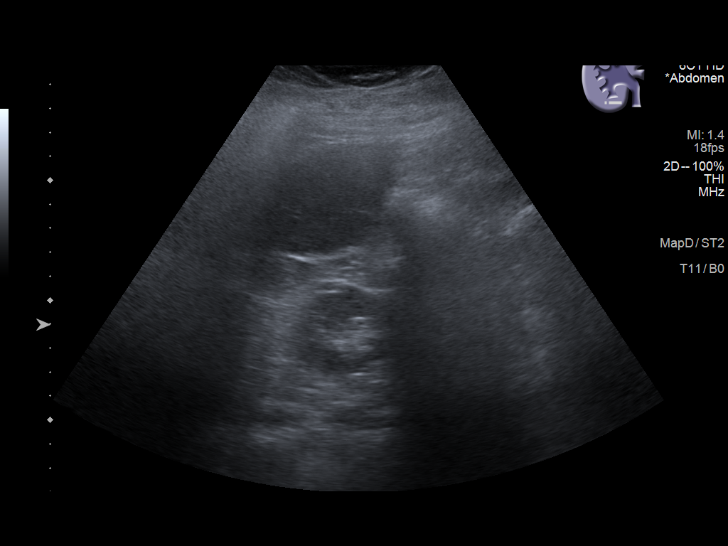
[im 62/83]
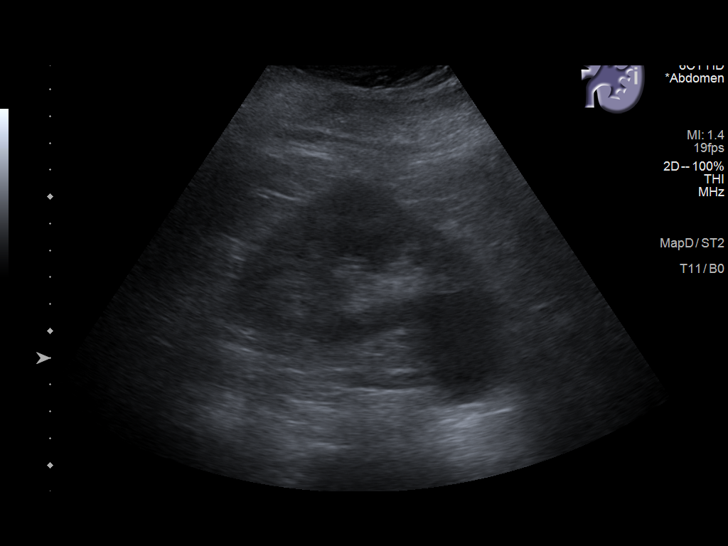
[im 69/83]
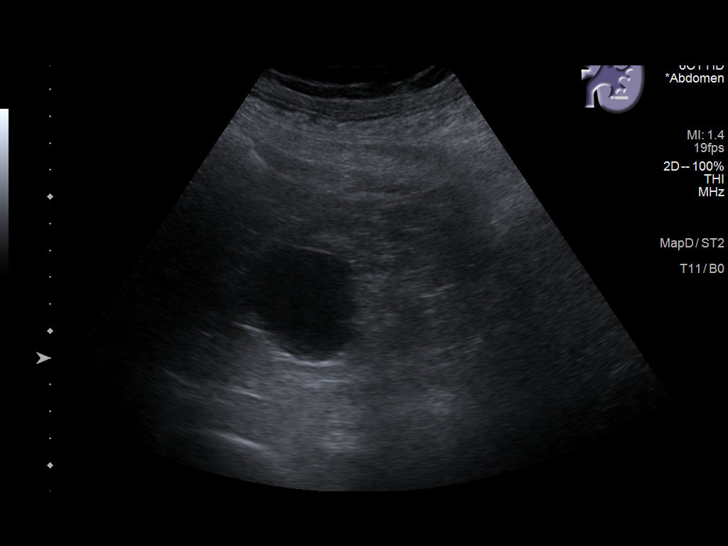
[im 76/83]
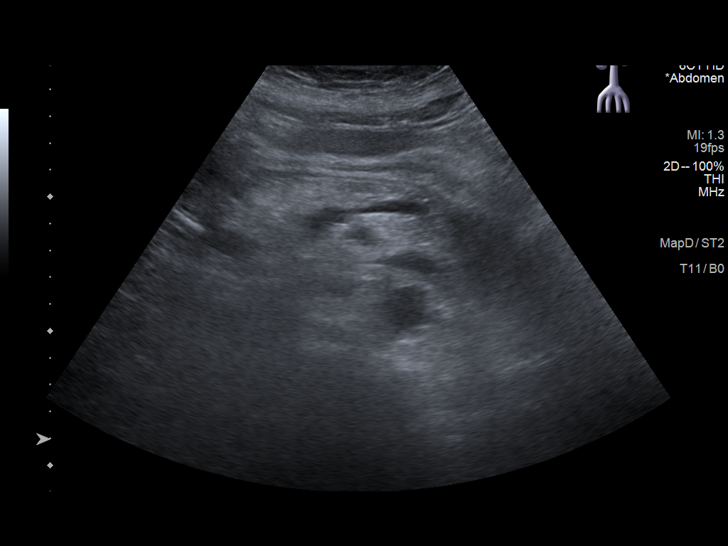
[im 83/83]
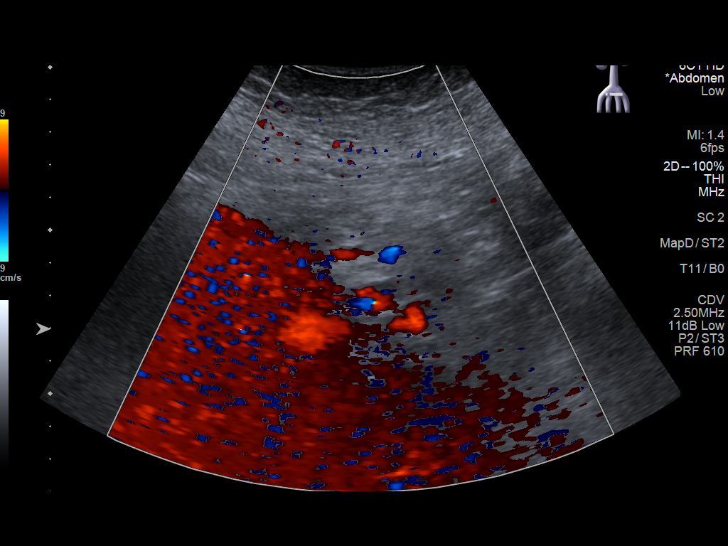

[13 of 25 positions shown; findings below may reference images not displayed]

FINDINGS: Gallbladder: The gallbladder is surgically absent.

Common bile duct: Diameter: 6.8 mm

Liver: No focal lesion identified. Diffusely increased echogenicity
of the liver parenchyma is noted. Portal vein is patent on color
Doppler imaging with normal direction of blood flow towards the
liver.

IVC: No abnormality visualized.

Pancreas: Poorly visualized secondary to overlying bowel gas.

Spleen: Size (8.2 cm) and appearance within normal limits.

Right Kidney: Length: 9.4 cm. Echogenicity within normal limits. No
mass or hydronephrosis visualized.

Left Kidney: Length: 11.4 cm. Echogenicity within normal limits. A
4.3 cm x 3.1 cm x 4.0 cm anechoic structure is seen within the left
kidney. No abnormal flow is noted within this region on color
Doppler evaluation. No hydronephrosis is visualized.

Abdominal aorta: No aneurysm visualized.

Other findings: None.
IMPRESSION: 1. Findings consistent with history of prior cholecystectomy.
2. Hepatic steatosis without focal liver lesions.
3. Simple left renal cyst.

## 2024-03-19 IMAGING — CT CT ABD-PELV W/ CM
2 of 5 series · 15 of 46 positions shown, 17 images · IV contrast (agent unspecified)
Comparison: None.

CLINICAL DATA: Ventral hernia

EXAM:
CT ABDOMEN AND PELVIS WITH CONTRAST
TECHNIQUE: Multidetector CT imaging of the abdomen and pelvis was performed
using the standard protocol following bolus administration of
intravenous contrast.

[Series 2: abd pelvis 5.00 · axial · 0.90mm/px · z∈[-1548,-1098]mm · 12 of 102 slices shown, 14 images]
[im 6/102  soft-tissue]
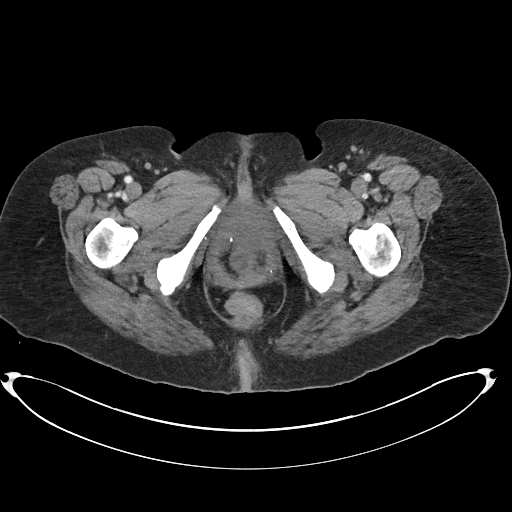
[im 6/102  bone]
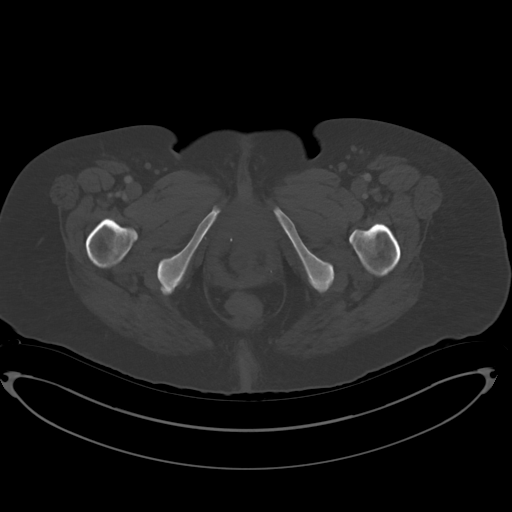
[im 18/102  soft-tissue]
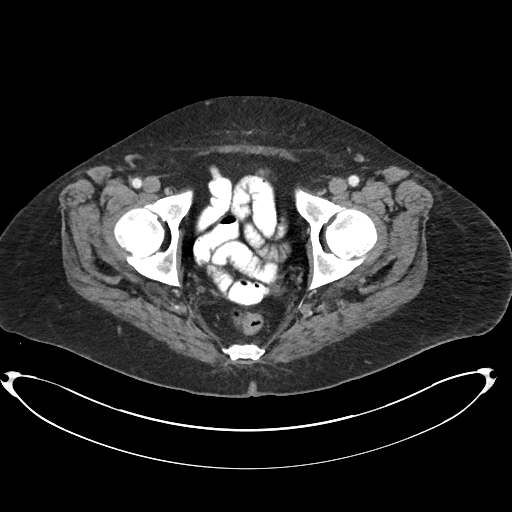
[im 24/102  soft-tissue]
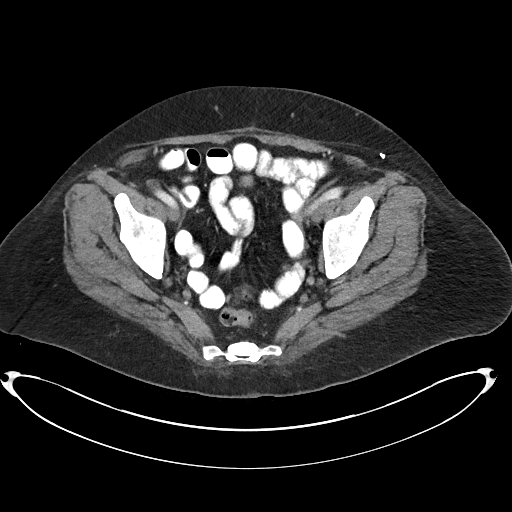
[im 30/102  soft-tissue]
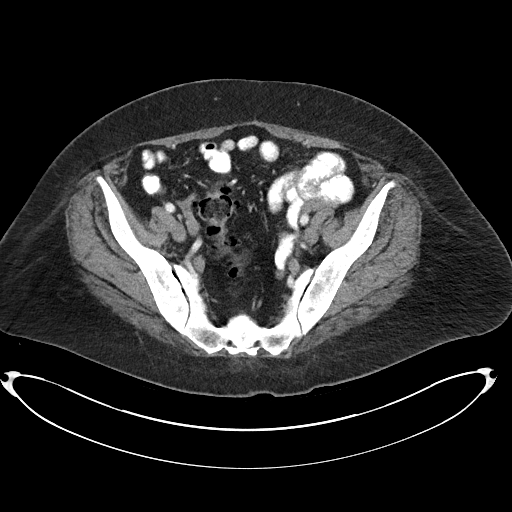
[im 42/102  soft-tissue]
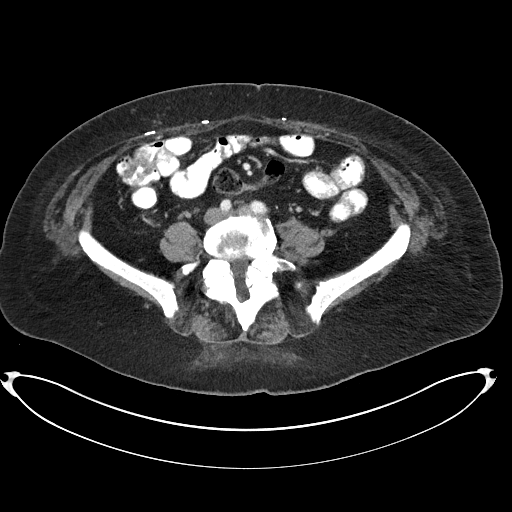
[im 48/102  soft-tissue]
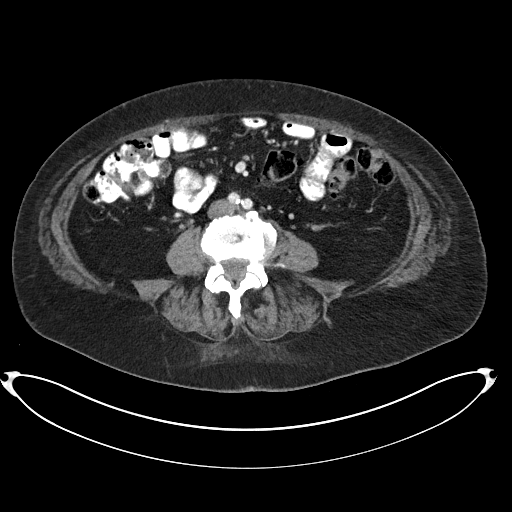
[im 54/102  soft-tissue]
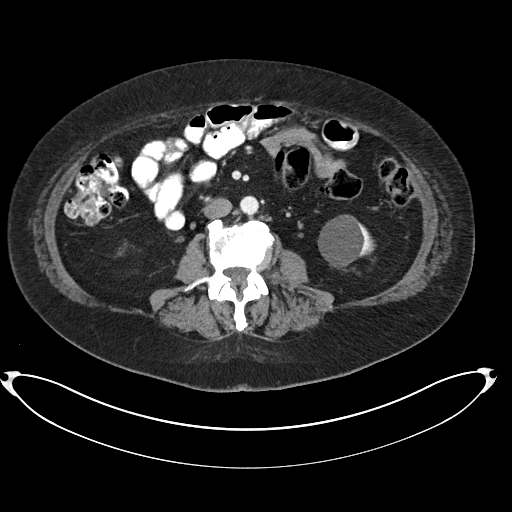
[im 66/102  soft-tissue]
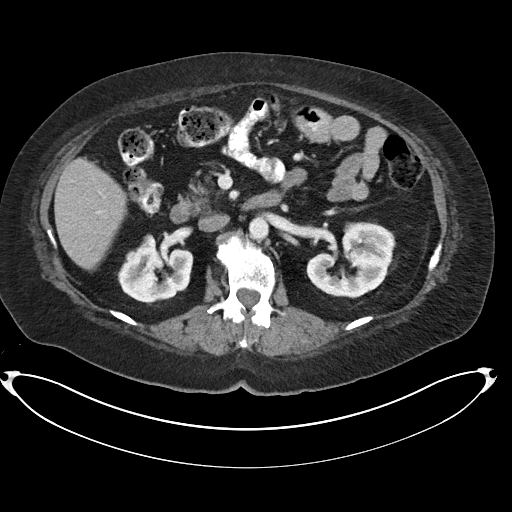
[im 72/102  soft-tissue]
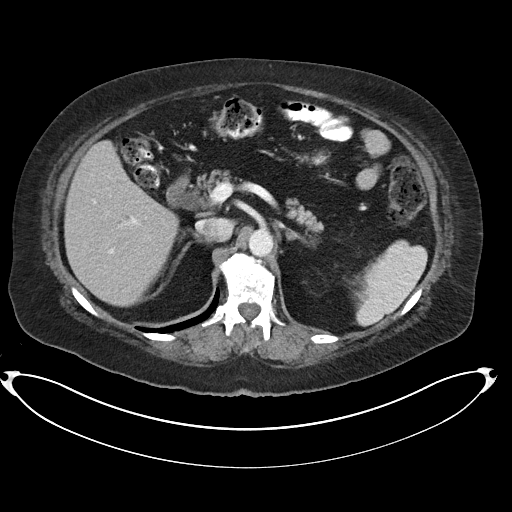
[im 72/102  bone]
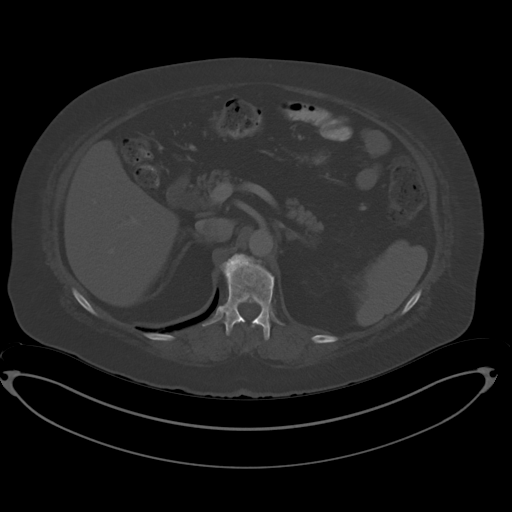
[im 78/102  soft-tissue]
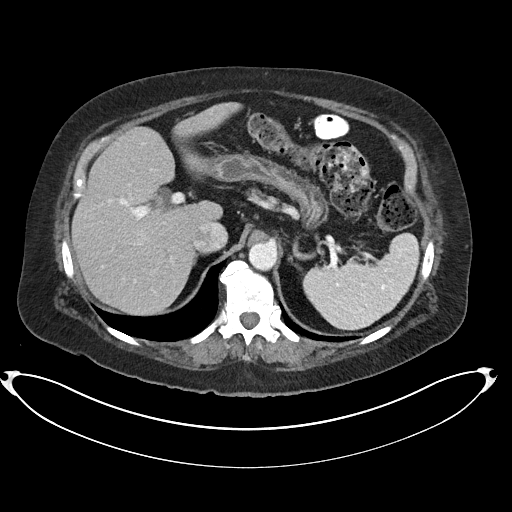
[im 90/102  soft-tissue]
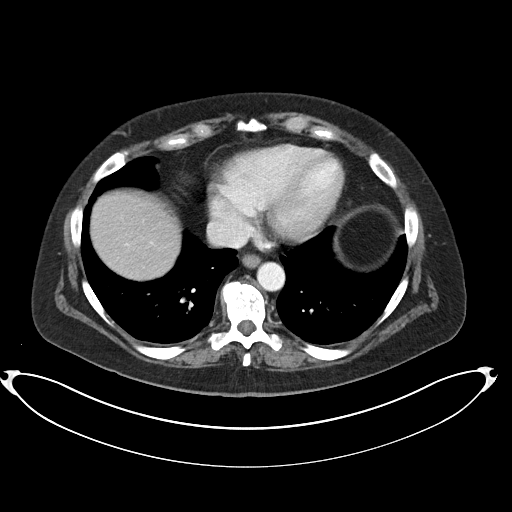
[im 96/102  soft-tissue]
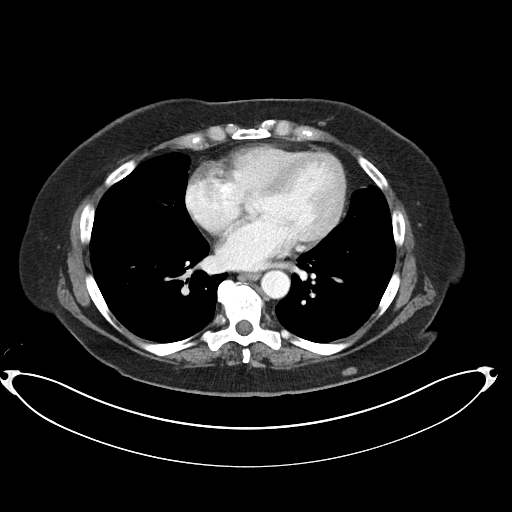

[Series 4: coronals abd pelvis 2.00 cor · coronal · 0.90mm/px · 3 of 159 slices shown]
[im 53/159  soft-tissue]
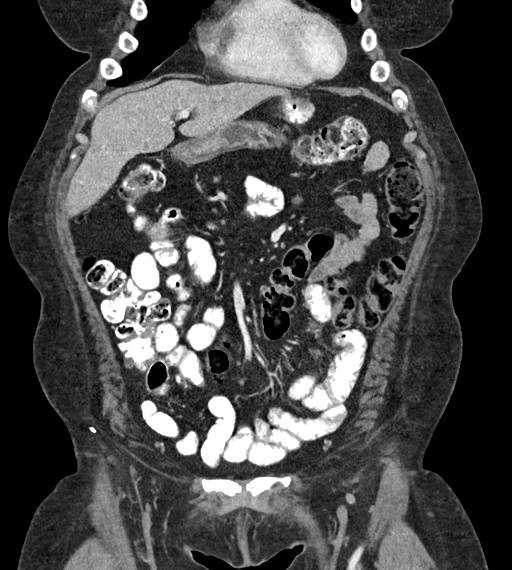
[im 71/159  soft-tissue]
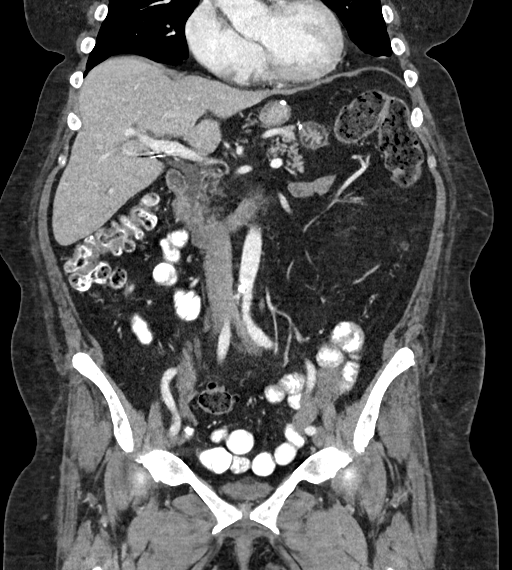
[im 88/159  soft-tissue]
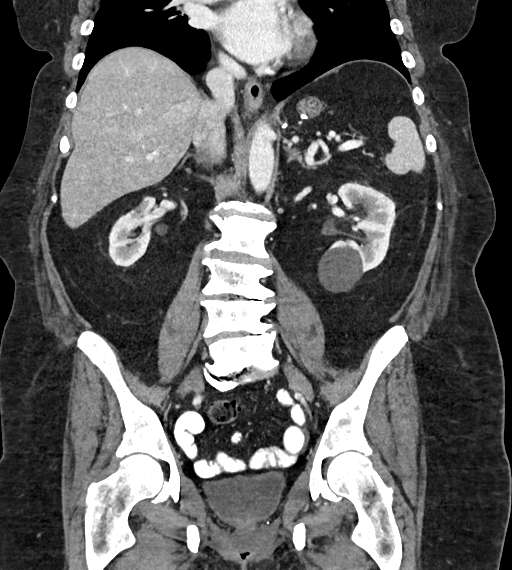

[15 of 46 positions shown; findings below may reference images not displayed]

RADIATION DOSE REDUCTION: This exam was performed according to the
departmental dose-optimization program which includes automated
exposure control, adjustment of the mA and/or kV according to
patient size and/or use of iterative reconstruction technique.

CONTRAST:  100mL OMNIPAQUE IOHEXOL 300 MG/ML  SOLN
FINDINGS: Lower chest: There is a 6 mm pulmonary nodule within left lower
lobe.

Hepatobiliary: No focal liver abnormality. Status post
cholecystectomy. No biliary dilatation.

Pancreas: Diffusely atrophic. No focal lesion. Otherwise normal
pancreatic contour. No surrounding inflammatory changes. No main
pancreatic ductal dilatation.

Spleen: Normal in size without focal abnormality.

Adrenals/Urinary Tract:

No adrenal nodule bilaterally.

Bilateral kidneys enhance symmetrically. Right renal cortical
scarring. Subcentimeter hypodensity. There is a 4.9 cm fluid density
lesion within left kidney likely represents a simple renal cyst.

No hydronephrosis. No hydroureter.

The urinary bladder is unremarkable.

Stomach/Bowel: Status post Roux-en-Y gastric bypass. Stomach is
within normal limits. No evidence of large bowel wall thickening or
dilatation. Irregular bowel wall thickening of the jejunojejunostomy
site ([DATE], [DATE]). Appendix appears normal.

Vascular/Lymphatic: No abdominal aorta or iliac aneurysm. Mild
atherosclerotic plaque of the aorta and its branches. No abdominal,
pelvic, or inguinal lymphadenopathy.

Reproductive: Status post hysterectomy. No adnexal masses.

Other: No intraperitoneal free fluid. No intraperitoneal free gas.
No organized fluid collection.

Musculoskeletal:

Small fat containing umbilical hernia. Surgical clips along the
anterior abdominal wall.

No suspicious lytic or blastic osseous lesions. No acute displaced
fracture. Bilateral L5 pars interarticularis defects with grade 1
anterolisthesis L5 on S1 and mild retrolisthesis of L4 on L5.
Multilevel degenerative changes of the spine.
IMPRESSION: 1. Irregular bowel wall thickening of the jejunojejunostomy site.
Correlation with prior cross-sectional imaging would be of value.
2. Small fat containing umbilical hernia. No findings suggest
associated ischemia or bowel obstruction.
3. A 6 mm left lower lobe pulmonary nodule. Non-contrast chest CT at
6-12 months is recommended. If the nodule is stable at time of
repeat CT, then future CT at 18-24 months (from today's scan) is
considered optional for low-risk patients, but is recommended for
high-risk patients. This recommendation follows the consensus
statement: Guidelines for Management of Incidental Pulmonary Nodules
Detected on CT Images: From the [HOSPITAL] 7879; Radiology
4. Bilateral L5 pars interarticularis defects with grade 1
anterolisthesis L5 on S1 and mild retrolisthesis of L4 on L5.

## 2024-04-16 ENCOUNTER — Other Ambulatory Visit: Payer: Self-pay | Admitting: Family Medicine

## 2024-04-16 DIAGNOSIS — M461 Sacroiliitis, not elsewhere classified: Secondary | ICD-10-CM

## 2024-04-19 NOTE — Telephone Encounter (Signed)
 Requested medication (s) are due for refill today: yes  Requested medication (s) are on the active medication list: yes  Last refill:  08/28/23  Future visit scheduled: no  Notes to clinic:  Unable to refill per protocol, cannot delegate.      Requested Prescriptions  Pending Prescriptions Disp Refills   cyclobenzaprine  (FLEXERIL ) 5 MG tablet [Pharmacy Med Name: CYCLOBENZAPRINE  5MG  TABLETS] 30 tablet 2    Sig: TAKE 1 TABLET(5 MG) BY MOUTH AT BEDTIME     Not Delegated - Analgesics:  Muscle Relaxants Failed - 04/19/2024  2:57 PM      Failed - This refill cannot be delegated      Failed - Valid encounter within last 6 months    Recent Outpatient Visits   None

## 2024-04-27 ENCOUNTER — Encounter: Payer: Self-pay | Admitting: Family Medicine

## 2024-04-27 ENCOUNTER — Ambulatory Visit: Payer: Self-pay | Admitting: Family Medicine

## 2024-04-27 VITALS — BP 137/75 | HR 66 | Ht 67.0 in | Wt 221.0 lb

## 2024-04-27 DIAGNOSIS — Z0000 Encounter for general adult medical examination without abnormal findings: Secondary | ICD-10-CM

## 2024-04-27 NOTE — Progress Notes (Signed)
 Subjective:   Kathy Mcintosh is a 68 y.o. female who presents for Medicare Annual (Subsequent) preventive examination.  Visit Complete: In person  Patient Medicare AWV questionnaire was completed by the patient on 04/27/24; I have confirmed that all information answered by patient is correct and no changes since this date.  Cardiac Risk Factors include: none     Objective:     Today's Vitals   04/27/24 1003  BP: 137/75  Pulse: 66  SpO2: 100%  Weight: 221 lb (100.2 kg)  Height: 5\' 7"  (1.702 m)  PainSc: 0-No pain   Body mass index is 34.61 kg/m.     04/24/2022    9:22 AM 04/10/2017    8:37 AM 11/24/2015    9:22 AM 11/20/2015   12:40 PM  Advanced Directives  Does Patient Have a Medical Advance Directive? No No No Yes  Type of Theme park manager;Living will  Would patient like information on creating a medical advance directive?   No - patient declined information     Current Medications (verified) Outpatient Encounter Medications as of 04/27/2024  Medication Sig   calcium carbonate (OSCAL) 1500 (600 Ca) MG TABS tablet Take 600 mg of elemental calcium by mouth 3 (three) times daily with meals.   Cimetidine (TAGAMET PO) Take by mouth.   cyclobenzaprine  (FLEXERIL ) 5 MG tablet TAKE 1 TABLET(5 MG) BY MOUTH AT BEDTIME   estradiol  (ESTRACE  VAGINAL) 0.1 MG/GM vaginal cream Place 1 Applicatorful vaginally at bedtime.   ferrous sulfate 324 MG TBEC Take 324 mg by mouth.   fluticasone  (FLONASE ) 50 MCG/ACT nasal spray PLACE 2 SPRAYS INTO BOTH NOSTRILS DAILY.   gabapentin  (NEURONTIN ) 100 MG capsule TAKE 3 CAPSULES(300 MG) BY MOUTH AT BEDTIME   levothyroxine  (SYNTHROID ) 100 MCG tablet TAKE 1 TABLET(100 MCG) BY MOUTH DAILY   lipase/protease/amylase (CREON) 36000 UNITS CPEP capsule Take 2 capsules by mouth daily. Take 2 capsules with every meal   loratadine  (CLARITIN ) 10 MG tablet TAKE 1 TABLET (10 MG TOTAL) BY MOUTH DAILY.   losartan  (COZAAR ) 50 MG tablet  TAKE 1 TABLET(50 MG) BY MOUTH DAILY   metoCLOPramide  (REGLAN ) 10 MG tablet Take 10 mg by mouth every 6 (six) hours as needed for nausea.   misoprostol (CYTOTEC) 200 MCG tablet Take 200 mcg by mouth.   NON FORMULARY 1 tablet by Mouth Rinse route daily. Bariatric multivitamin - Take one tablet by mouth daily   ondansetron  (ZOFRAN ) 8 MG tablet Take 1 tablet (8 mg total) by mouth every 8 (eight) hours as needed for nausea or vomiting.   pantoprazole  (PROTONIX ) 40 MG tablet Take 1 tablet (40 mg total) by mouth daily. Pt states taking BID   sucralfate  (CARAFATE ) 1 g tablet Take 1 tablet (1 g total) by mouth 4 (four) times daily.   sucralfate  (CARAFATE ) 1 GM/10ML suspension Take 10 mLs (1 g total) by mouth 4 (four) times daily -  with meals and at bedtime.   No facility-administered encounter medications on file as of 04/27/2024.    Allergies (verified) Sulfa antibiotics   History: Past Medical History:  Diagnosis Date   Arthritis    Atypical ductal hyperplasia of right breast 2014   Breast cancer (HCC) 2013   Esophageal spasm    GERD (gastroesophageal reflux disease)    Headache    migraines   Hypertension    Hypothyroidism    PONV (postoperative nausea and vomiting)    Sleep apnea    Past Surgical History:  Procedure Laterality Date   ABDOMINAL HYSTERECTOMY  09/2013   BREAST BIOPSY Right 12/07/2012   stereo biopsy - positive   BREAST LUMPECTOMY Right 01/01/2013   ADH -    BREAST MASS EXCISION Right 12/2012   BREAST SURGERY Right 12/07/2012   Retroareolar papilloma with 2 mm foci of ADH. Negative margins.l   CHOLECYSTECTOMY  1984   COLONOSCOPY WITH PROPOFOL  N/A 01/15/2017   Procedure: COLONOSCOPY WITH PROPOFOL ;  Surgeon: Marshall Skeeter, MD;  Location: [facility] END[facility];  Service: Endoscopy;  Laterality: N/A;   ESOPHAGOGASTRODUODENOSCOPY (EGD) WITH PROPOFOL  N/A 01/15/2017   Procedure: ESOPHAGOGASTRODUODENOSCOPY (EGD) WITH PROPOFOL ;  Surgeon: Marshall Skeeter, MD;  Location: [facility]  ENDOSCOPY;  Service: Endoscopy;  Laterality: N/A;   ESOPHAGOGASTRODUODENOSCOPY (EGD) WITH PROPOFOL  N/A 04/24/2022   Procedure: ESOPHAGOGASTRODUODENOSCOPY (EGD) WITH PROPOFOL ;  Surgeon: Marshall Skeeter, MD;  Location: [facility] END[facility]COPY;  Service: Endoscopy;  Laterality: N/A;  PEDS SCOPE   GASTRIC BYPASS  2004   KNEE ARTHROSCOPY  Right 2011, Left 2012   MASS EXCISION Bilateral 11/24/2015   Procedure: LEFT RING FINGER AND RIGHT THUMB MASS EXCISION ;  Surgeon: Ronn Cohn, MD;  Location: [facility];  Service: Orthopedics;  Laterality: Bilateral;   MM BREAST STEREO BX*L*R/S  11/2012   pannulectomy     WISDOM TOOTH EXTRACTION     Family History  Problem Relation Age of Onset   Lung cancer Mother    Cervical cancer Mother    Hypertension Mother    Asthma Mother    COPD Mother    Dementia Mother    Cervical cancer Sister    Breast cancer Sister    Heart failure Father    Heart disease Father    COPD Father    Asthma Daughter    Breast cancer Paternal Aunt 25   Social History   Socioeconomic History   Marital status: Married    Spouse name: Not on file   Number of children: Not on file   Years of education: Not on file   Highest education level: Associate degree: academic program  Occupational History   Not on file  Tobacco Use   Smoking status: Never   Smokeless tobacco: Never  Vaping Use   Vaping status: Never Used  Substance and Sexual Activity   Alcohol use: No   Drug use: No   Sexual activity: Not Currently  Other Topics Concern   Not on file  Social History Narrative   Not on file   Social Drivers of Health   Financial Resource Strain: Low Risk  (04/26/2024)   Overall Financial Resource Strain (CARDIA)    Difficulty of Paying Living Expenses: Not hard at all  Food Insecurity: No Food Insecurity (04/26/2024)   Hunger Vital Sign    Worried About Running Out of Food in the Last Year: Never true    Ran Out of Food in the Last Year: Never true   Transportation Needs: No Transportation Needs (04/26/2024)   PRAPARE - Administrator, Civil Service (Medical): No    Lack of Transportation (Non-Medical): No  Physical Activity: Insufficiently Active (04/26/2024)   Exercise Vital Sign    Days of Exercise per Week: 1 day    Minutes of Exercise per Session: 10 min  Stress: No Stress Concern Present (04/26/2024)   [employer] of Occupational Health - Occupational Stress Questionnaire    Feeling of Stress : Not at all  Social Connections: Moderately Integrated (04/26/2024)   Social Connection and Isolation Panel [  NHANES]    Frequency of Communication with Friends and Family: More than three times a week    Frequency of Social Gatherings with Friends and Family: More than three times a week    Attends Religious Services: More than 4 times per year    Active Member of [employer] or Organizations: No    Attends Engineer, structural: Not on file    Marital Status: Married    Tobacco Counseling Counseling given: Not Answered   Clinical Intake:  Pre-visit preparation completed: Yes  Pain : No/denies pain Pain Score: 0-No pain     Nutritional Risks: None Diabetes: No  How often do you need to have someone help you when you read instructions, pamphlets, or other written materials from your doctor or pharmacy?: 1 - Never  Interpreter Needed?: No      Activities of Daily Living    04/27/2024   10:01 AM 04/26/2024   10:17 PM  In your present state of health, do you have any difficulty performing the following activities:  Hearing? 0 0  Vision? 0 0  Difficulty concentrating or making decisions? 0 0  Walking or climbing stairs? 0 0  Dressing or bathing? 0 0  Doing errands, shopping? 0 0  Preparing Food and eating ? N N  Using the Toilet? N N  In the past six months, have you accidently leaked urine? Y Y  Do you have problems with loss of bowel control? N N  Managing your Medications? N N  Managing your  Finances? N N  Housekeeping or managing your Housekeeping? N N    Patient Care Team: Mimi Alt, MD as PCP - General (Family Medicine) Marquita Situ, Magali Schmitz, MD (General Surgery)  Indicate any recent Medical Services you may have received from other than [facility] providers in the past year (date may be approximate).     Assessment:    This is a routine wellness examination for Kathy Mcintosh.  Hearing/Vision screen No results found.   Goals Addressed   None    Depression Screen    04/27/2024   10:03 AM 01/12/2024   10:02 AM 11/27/2022   10:57 AM 02/06/2022    1:49 PM 07/31/2021    9:24 AM 07/27/2020    9:12 AM 04/30/2019    1:41 PM  PHQ 2/9 Scores  PHQ - 2 Score 0 0 0 0 0 0 0  PHQ- 9 Score 0 0  0  0     Fall Risk    04/26/2024   10:17 PM 11/27/2022   10:57 AM 02/06/2022    1:49 PM 07/31/2021    9:24 AM 07/27/2020    9:12 AM  Fall Risk   Falls in the past year? 0 0 1 0 1  Number falls in past yr: 0 0 0 0 1  Injury with Fall? 0 0 0 0 0  Risk for fall due to :  No Fall Risks  No Fall Risks   Follow up    Falls evaluation completed Falls evaluation completed    MEDICARE RISK AT HOME: Medicare Risk at Home Any stairs in or around the home?: (Patient-Rptd) Yes If so, are there any without handrails?: (Patient-Rptd) No Home free of loose throw rugs in walkways, pet beds, electrical cords, etc?: (Patient-Rptd) Yes Adequate lighting in your home to reduce risk of falls?: (Patient-Rptd) Yes Life alert?: (Patient-Rptd) No Use of a cane, walker or w/c?: (Patient-Rptd) No Grab bars in the bathroom?: (Patient-Rptd) Yes Shower chair or bench  in shower?: (Patient-Rptd) Yes Elevated toilet seat or a handicapped toilet?: (Patient-Rptd) No  TIMED UP AND GO:  Was the test performed?  No    Cognitive Function:        07/31/2021    9:23 AM  6CIT Screen  What Year? 0 points  What month? 0 points  What time? 0 points  Count back from 20 0 points  Months in reverse 0  points  Repeat phrase 0 points  Total Score 0 points    Immunizations Immunization History  Administered Date(s) Administered   Fluad Quad(high Dose 65+) 10/31/2020, 02/06/2022, 11/27/2022   Influenza,inj,Quad PF,6+ Mos 12/24/2018   Influenza-Unspecified 11/14/2022   PFIZER(Purple Top)SARS-COV-2 Vaccination 04/22/2020, 05/16/2020   PNEUMOCOCCAL CONJUGATE-20 02/06/2022   Pneumococcal Polysaccharide-23 07/27/2020   Td 09/25/2004   Tdap 04/10/2017    TDAP status: Up to date  Flu Vaccine status: Up to date  Pneumococcal vaccine status: Up to date  Covid-19 vaccine status: Information provided on how to obtain vaccines.   Qualifies for Shingles Vaccine? Yes   Zostavax completed No   Shingrix Completed?: No.    Education has been provided regarding the importance of this vaccine. Patient has been advised to call insurance company to determine out of pocket expense if they have not yet received this vaccine. Advised may also receive vaccine at local pharmacy or Health Dept. Verbalized acceptance and understanding.  Screening Tests Health Maintenance  Topic Date Due   Zoster Vaccines- Shingrix (1 of 2) Never done   COVID-19 Vaccine (3 - 2024-25 season) 08/17/2023   INFLUENZA VACCINE  07/16/2024   MAMMOGRAM  01/15/2025   Medicare Annual Wellness (AWV)  04/27/2025   Colonoscopy  01/15/2027   DTaP/Tdap/Td (3 - Td or Tdap) 04/11/2027   Pneumonia Vaccine 23+ Years old  Completed   DEXA SCAN  Completed   Hepatitis C Screening  Completed   HPV VACCINES  Aged Out   Meningococcal B Vaccine  Aged Out    Health Maintenance  Health Maintenance Due  Topic Date Due   Zoster Vaccines- Shingrix (1 of 2) Never done   COVID-19 Vaccine (3 - 2024-25 season) 08/17/2023    Colorectal cancer screening: Type of screening: Colonoscopy. Completed 2018. Repeat every 10 years  Mammogram status: Completed 12/2022. Repeat every year  Bone Density status: Completed 01/2023. Results reflect: Bone  density results: NORMAL. Repeat every 2 years.  Lung Cancer Screening: (Low Dose CT Chest recommended if Age 27-80 years, 20 pack-year currently smoking OR have quit w/in 15years.) does not qualify.   Lung Cancer Screening Referral: n/a  Additional Screening:  Hepatitis C Screening: does qualify; Completed 03/2024  Vision Screening: Recommended annual ophthalmology exams for early detection of glaucoma and other disorders of the eye. Is the patient up to date with their annual eye exam?  Yes  Who is the provider or what is the name of the office in which the patient attends annual eye exams? Dr. Daphney Eans [facility]) If pt is not established with a provider, would they like to be referred to a provider to establish care? No .   Dental Screening: Recommended annual dental exams for proper oral hygiene  Diabetic Foot Exam: N/A  Community Resource Referral / Chronic Care Management: CRR required this visit?  No   CCM required this visit?  No  Physical Exam Vitals reviewed.  Constitutional:      General: She is not in acute distress.    Appearance: Normal appearance. She is not ill-appearing.  Cardiovascular:     Rate and Rhythm: Normal rate and regular rhythm.  Pulmonary:     Effort: Pulmonary effort is normal. No respiratory distress.     Breath sounds: No wheezing, rhonchi or rales.  Neurological:     Mental Status: She is alert and oriented to person, place, and time.  Psychiatric:        Mood and Affect: Mood normal.        Behavior: Behavior normal.       Plan:     I have personally reviewed and noted the following in the patient's chart:   Medical and social history Use of alcohol, tobacco or illicit drugs  Current medications and supplements including opioid prescriptions. Patient is not currently taking opioid prescriptions. Functional ability and status Nutritional status Physical activity Advanced directives List of other physicians Hospitalizations,  surgeries, and ER visits in previous 12 months Vitals Screenings to include cognitive, depression, and falls Referrals and appointments  In addition, I have reviewed and discussed with patient certain preventive protocols, quality metrics, and best practice recommendations. A written personalized care plan for preventive services as well as general preventive health recommendations were provided to patient.     Mimi Alt, MD   04/27/2024   After Visit Summary: (Declined) Due to this being a telephonic visit, with patients personalized plan was offered to patient but patient Declined AVS at this time

## 2024-04-27 NOTE — Patient Instructions (Addendum)
 [facility] [address] [place],  [place]  [address] Main: [phone]  - please contact them to obtain copy of bone density    Shingrix vaccine

## 2024-05-20 ENCOUNTER — Other Ambulatory Visit: Payer: Self-pay | Admitting: Family Medicine

## 2024-05-21 ENCOUNTER — Telehealth: Payer: Self-pay | Admitting: Family Medicine

## 2024-05-21 MED ORDER — GABAPENTIN 100 MG PO CAPS: 100.0000 mg | ORAL_CAPSULE | Freq: Every day | ORAL | 1 refills | Status: DC

## 2024-05-21 NOTE — Telephone Encounter (Signed)
 [facility] pharmacy is requesting refill gabapentin (NEURONTIN) 100 MG capsule  Please advise

## 2024-05-21 NOTE — Telephone Encounter (Signed)
 Requested Prescriptions  Pending Prescriptions Disp Refills   gabapentin  (NEURONTIN ) 100 MG capsule [Pharmacy Med Name: GABAPENTIN  100MG  CAPSULES] 180 capsule 1    Sig: TAKE 3 CAPSULES(300 MG) BY MOUTH AT BEDTIME     Neurology: Anticonvulsants - gabapentin  Passed - 05/21/2024  8:28 AM      Passed - Cr in normal range and within 360 days    Creatinine, Ser  Date Value Ref Range Status  01/22/2024 0.58 0.57 - 1.00 mg/dL Final         Passed - Completed PHQ-2 or PHQ-9 in the last 360 days      Passed - Valid encounter within last 12 months    Recent Outpatient Visits           3 weeks ago Encounter for annual wellness visit (AWV) in Medicare patient   [place] [facility], Judyann Number, MD

## 2024-08-15 ENCOUNTER — Other Ambulatory Visit: Payer: Self-pay | Admitting: Family Medicine

## 2024-08-15 DIAGNOSIS — M461 Sacroiliitis, not elsewhere classified: Secondary | ICD-10-CM

## 2024-09-10 ENCOUNTER — Other Ambulatory Visit (HOSPITAL_COMMUNITY): Payer: Self-pay

## 2024-09-10 ENCOUNTER — Telehealth: Payer: Self-pay

## 2024-09-10 NOTE — Telephone Encounter (Signed)
 Pharmacy Patient Advocate Encounter   Received notification from Onbase that prior authorization for Cyclobenzaprine  HCl 5MG  tablets  is required/requested.   Insurance verification completed.   The patient is insured through [facility] .   Per test claim: PA required; PA submitted to above mentioned insurance via Latent Key/confirmation #/EOC [id number] Status is pending

## 2024-09-10 NOTE — Telephone Encounter (Signed)
 Pharmacy Patient Advocate Encounter  Received notification from [facility] that Prior Authorization for Cyclobenzaprine  HCl 5MG  tablets  has been APPROVED from 09/10/24 to 12/15/2098. Ran test claim, Copay is $.82. This test claim was processed through [facility]- copay amounts may vary at other pharmacies due to pharmacy/plan contracts, or as the patient moves through the different stages of their insurance plan.   PA #/Case ID/Reference #: [id number]

## 2024-10-14 ENCOUNTER — Other Ambulatory Visit: Payer: Self-pay | Admitting: Family Medicine

## 2024-10-14 DIAGNOSIS — E039 Hypothyroidism, unspecified: Secondary | ICD-10-CM

## 2024-10-14 DIAGNOSIS — I10 Essential (primary) hypertension: Secondary | ICD-10-CM

## 2024-10-28 ENCOUNTER — Ambulatory Visit: Admitting: Family Medicine

## 2024-11-22 MED ORDER — GABAPENTIN 100 MG PO CAPS: 100.0000 mg | ORAL_CAPSULE | Freq: Three times a day (TID) | ORAL | Status: DC

## 2024-11-26 MED ORDER — GABAPENTIN 100 MG PO CAPS: 100.0000 mg | ORAL_CAPSULE | Freq: Three times a day (TID) | ORAL | 1 refills | Status: AC

## 2024-11-26 NOTE — Addendum Note (Signed)
 Addended by: SIMMONS-ROBINSON, Yanky Vanderburg L on: 11/26/2024 07:42 AM   Modules accepted: Orders

## 2024-12-11 ENCOUNTER — Other Ambulatory Visit: Payer: Self-pay | Admitting: Family Medicine

## 2024-12-11 DIAGNOSIS — M461 Sacroiliitis, not elsewhere classified: Secondary | ICD-10-CM

## 2024-12-28 ENCOUNTER — Ambulatory Visit: Admitting: Family Medicine

## 2024-12-28 ENCOUNTER — Encounter: Payer: Self-pay | Admitting: Family Medicine

## 2024-12-28 VITALS — BP 138/84 | HR 61 | Ht 67.0 in | Wt 214.6 lb

## 2024-12-28 DIAGNOSIS — K219 Gastro-esophageal reflux disease without esophagitis: Secondary | ICD-10-CM | POA: Diagnosis not present

## 2024-12-28 DIAGNOSIS — M1990 Unspecified osteoarthritis, unspecified site: Secondary | ICD-10-CM

## 2024-12-28 DIAGNOSIS — K259 Gastric ulcer, unspecified as acute or chronic, without hemorrhage or perforation: Secondary | ICD-10-CM

## 2024-12-28 DIAGNOSIS — E039 Hypothyroidism, unspecified: Secondary | ICD-10-CM

## 2024-12-28 DIAGNOSIS — E538 Deficiency of other specified B group vitamins: Secondary | ICD-10-CM

## 2024-12-28 DIAGNOSIS — Z780 Asymptomatic menopausal state: Secondary | ICD-10-CM

## 2024-12-28 DIAGNOSIS — Z9884 Bariatric surgery status: Secondary | ICD-10-CM

## 2024-12-28 DIAGNOSIS — Z23 Encounter for immunization: Secondary | ICD-10-CM | POA: Diagnosis not present

## 2024-12-28 DIAGNOSIS — Z1231 Encounter for screening mammogram for malignant neoplasm of breast: Secondary | ICD-10-CM

## 2024-12-28 DIAGNOSIS — I10 Essential (primary) hypertension: Secondary | ICD-10-CM | POA: Diagnosis not present

## 2024-12-28 DIAGNOSIS — E66811 Obesity, class 1: Secondary | ICD-10-CM | POA: Diagnosis not present

## 2024-12-28 DIAGNOSIS — Z6833 Body mass index (BMI) 33.0-33.9, adult: Secondary | ICD-10-CM

## 2024-12-28 DIAGNOSIS — E7800 Pure hypercholesterolemia, unspecified: Secondary | ICD-10-CM | POA: Diagnosis not present

## 2024-12-28 DIAGNOSIS — E559 Vitamin D deficiency, unspecified: Secondary | ICD-10-CM | POA: Diagnosis not present

## 2024-12-28 DIAGNOSIS — R748 Abnormal levels of other serum enzymes: Secondary | ICD-10-CM | POA: Diagnosis not present

## 2024-12-28 MED ORDER — WEGOVY 0.25 MG/0.5ML ~~LOC~~ SOAJ: 0.2500 mg | SUBCUTANEOUS | Status: AC

## 2024-12-28 NOTE — Assessment & Plan Note (Signed)
 Acquired hypothyroidism Chronic Managed with Synthroid  100 mcg daily. - Continue Synthroid  100 mcg daily. - Ordered thyroid panel.

## 2024-12-28 NOTE — Patient Instructions (Signed)
 To keep you healthy, please keep in mind the following health maintenance items that you are due for:   Health Maintenance Due  Topic Date Due   Zoster Vaccines- Shingrix (1 of 2) Never done   Influenza Vaccine  07/16/2024   COVID-19 Vaccine (3 - 2025-26 season) 08/16/2024   Mammogram  01/15/2025     Best Wishes,   Dr. Lang

## 2024-12-28 NOTE — Assessment & Plan Note (Signed)
 Chronic class I obesity BMI of 33. Interested in starting Wegovy  for weight management and potential benefits on elevated alkaline phosphatase levels. Discussed cost and insurance coverage issues. Potential side effects include constipation and nausea, which she is concerned about. Discussed possibility of using Wegovy  samples to assess tolerance. - Start Wegovy  0.25 mg weekly subcutaneously, patient given samples for 1 month  - f/u with weight loss specialist as scheduled

## 2024-12-28 NOTE — Assessment & Plan Note (Signed)
 Marginal ulcer post-gastric bypass Chronic  Marginal ulcer in the gastric pouch post-gastric bypass. Symptoms include nausea and early satiety. Currently managed with Carafate  and Protonix . Discussed potential revision surgery if symptoms persist. - Continue Carafate  1 gram tablets as needed. - Continue Protonix  40 mg daily. - Will discuss potential revision surgery with surgeon.

## 2024-12-28 NOTE — Assessment & Plan Note (Signed)
 Chronic  Previous liver scan was normal. Discussed potential benefits of Wegovy  in reducing alkaline phosphatase levels. - Start Wegovy  0.25mg  weekly as discussed under chronic class I obesity.

## 2024-12-28 NOTE — Assessment & Plan Note (Signed)
 Stable, chronic. ?

## 2024-12-28 NOTE — Assessment & Plan Note (Signed)
 Chronic  Blood pressure elevated at 141/77 mmHg. Home readings generally around 140/72 mmHg. Current management includes losartan  50 mg daily. Discussed leeway in blood pressure targets after age 70. - Continue losartan  50 mg daily. - Monitor blood pressure at home.

## 2024-12-28 NOTE — Assessment & Plan Note (Signed)
 Chronic  Managed with calcium supplementation. - Continue calcium 1500 mg daily with 600 mg elemental calcium three times a day

## 2024-12-28 NOTE — Assessment & Plan Note (Signed)
" °  Gastroesophageal reflux disease Chronic gastroesophageal reflux disease managed with Protonix  40 mg daily. - Continue Protonix  40 mg daily. -Follow up with gastroenterology as previously scheduled   "

## 2024-12-28 NOTE — Progress Notes (Signed)
 "  Established Patient Office Visit  Patient ID: Kathy Mcintosh, female    DOB: 07-Feb-1956  Age: 69 y.o. MRN: [id number] PCP: Sharma Coyer, MD  Chief Complaint  Patient presents with   Medical Management of Chronic Issues    Patient is present for 67mo f/u with PCP Would like to discuss starting wegovy , other provider prescribed it and patient did not start due to cost    Subjective:     HPI  Discussed the use of AI scribe software for clinical note transcription with the patient, who gave verbal consent to proceed.  History of Present Illness Kathy Mcintosh is a 69 year old female with chronic class one obesity and hypertension who presents for a six month follow-up.  She is interested in starting Wegovy  for her chronic class one obesity, with a BMI of 33. She has a history of gastric bypass surgery, which affects her eating habits. She is unable to eat large portions, often only managing small amounts like 'half of a chicken tender.' She experiences early satiety and has been grazing, which is not ideal for her weight management. She has stopped taking Creon and occasionally uses Carafate  for gastric issues.  She has chronic hypertension, with home blood pressure readings around 140/72. She is currently on losartan  50 mg for her hypertension.  She has a history of vitamin D and B12 deficiencies, acid reflux, and a marginal ulcer. She continues to take Protonix  40 mg daily and uses Cytotec 200 mcg for her ulcer. She avoids foods that exacerbate her symptoms, such as tomatoes and tomato sauce, and experiences nausea and vomiting after consuming certain foods like red eye gravy.  She is on Synthroid  100 mcg daily for hypothyroidism and takes gabapentin  100 mg three times a day for back pain. She has reduced her cyclobenzaprine  dose from 10 mg to 5 mg as needed for back pain. She tries to avoid prednisone  due to her ulcer.  Her family history includes her sister having  medullary thyroid cancer. She also mentions her sister's seizure episodes, which have been managed with Keppra, and her husband's recent ablation procedure.  She takes a variety of other medications including Zofran  8 mg as needed, Estrace  vaginal cream, Flexeril  5 mg, cimetidine, calcium 1500 mg daily, and iron supplements. She manages her symptoms and medication use carefully, given her complex medical history.   Patient Active Problem List   Diagnosis Date Noted   Gastric ulcer without hemorrhage or perforation 12/28/2024   Elevated alkaline phosphatase level 12/28/2024   Encounter for annual wellness visit (AWV) in Medicare patient 04/27/2024   Marginal ulcer 04/21/2023   S/P gastric bypass 04/21/2023   Postmenopausal estrogen deficiency 11/27/2022   Spinal stenosis of lumbar region with neurogenic claudication 11/14/2017   Epigastric pain 01/06/2017   Arthritis 10/25/2015   Age-related memory disorder 10/25/2015   Bladder cystocele 10/25/2015   Hypercholesteremia 10/25/2015   Cannot sleep 10/25/2015   Headache, migraine 10/25/2015   Detrusor muscle hypertonia 10/25/2015   B12 deficiency 10/25/2015   Acid reflux 09/21/2013   BP (high blood pressure) 09/21/2013   Adiposity 09/21/2013   Uterovaginal prolapse 07/23/2013   Atypical ductal hyperplasia, breast 06/22/2013   Vitamin D deficiency, unspecified 11/28/2012   Hypothyroidism, unspecified 11/28/2012   Past Medical History:  Diagnosis Date   Allergy    Not sure   Anemia    Not sure   Arthritis    Atypical ductal hyperplasia of right breast 2014   Breast  cancer (HCC) 2013   Esophageal spasm    GERD (gastroesophageal reflux disease)    Headache    migraines   Hypertension    Hypothyroidism    PONV (postoperative nausea and vomiting)    Sleep apnea    Ulcer 2015   Unsure      ROS    Objective:     BP 138/84 (Cuff Size: Normal)   Pulse 61   Ht 5' 7 (1.702 m)   Wt 214 lb 9.6 oz (97.3 kg)   LMP  11/28/1990   SpO2 99%   BMI 33.61 kg/m  BP Readings from Last 3 Encounters:  12/28/24 138/84  04/27/24 137/75  01/12/24 132/70   Wt Readings from Last 3 Encounters:  12/28/24 214 lb 9.6 oz (97.3 kg)  04/27/24 221 lb (100.2 kg)  01/12/24 218 lb 8 oz (99.1 kg)     Physical Exam Vitals reviewed.  Constitutional:      General: She is not in acute distress.    Appearance: Normal appearance. She is not ill-appearing.  Cardiovascular:     Rate and Rhythm: Normal rate and regular rhythm.  Pulmonary:     Effort: Pulmonary effort is normal. No respiratory distress.     Breath sounds: No wheezing, rhonchi or rales.  Neurological:     Mental Status: She is alert and oriented to person, place, and time.  Psychiatric:        Mood and Affect: Mood normal.        Behavior: Behavior normal.      No results found for any visits on 12/28/24.  Last metabolic panel Lab Results  Component Value Date   GLUCOSE 90 01/22/2024   NA 141 01/22/2024   K 4.4 01/22/2024   CL 103 01/22/2024   CO2 26 01/22/2024   BUN 21 01/22/2024   CREATININE 0.58 01/22/2024   EGFR 99 01/22/2024   CALCIUM 9.6 01/22/2024   PROT 6.3 01/22/2024   ALBUMIN 4.0 01/22/2024   LABGLOB 2.3 01/22/2024   AGRATIO 1.7 12/17/2022   BILITOT 0.5 01/22/2024   ALKPHOS 170 (H) 01/22/2024   AST 16 01/22/2024   ALT 12 01/22/2024   Last lipids Lab Results  Component Value Date   CHOL 162 01/22/2024   HDL 64 01/22/2024   LDLCALC 83 01/22/2024   TRIG 80 01/22/2024   CHOLHDL 2.5 01/22/2024   Last hemoglobin A1c No results found for: HGBA1C Last thyroid functions Lab Results  Component Value Date   TSH 3.120 01/22/2024   FREET4 1.17 01/22/2024      The 10-year ASCVD risk score (Arnett DK, et al., 2019) is: 11.8%  Outpatient Encounter Medications as of 12/28/2024  Medication Sig Note   calcium carbonate (OSCAL) 1500 (600 Ca) MG TABS tablet Take 600 mg of elemental calcium by mouth 3 (three) times daily with  meals.    Cimetidine (TAGAMET PO) Take by mouth.    cyclobenzaprine  (FLEXERIL ) 5 MG tablet TAKE 1 TABLET(5 MG) BY MOUTH AT BEDTIME    estradiol  (ESTRACE  VAGINAL) 0.1 MG/GM vaginal cream Place 1 Applicatorful vaginally at bedtime.    ferrous sulfate 324 MG TBEC Take 324 mg by mouth.    fluticasone  (FLONASE ) 50 MCG/ACT nasal spray PLACE 2 SPRAYS INTO BOTH NOSTRILS DAILY.    gabapentin  (NEURONTIN ) 100 MG capsule Take 1 capsule (100 mg total) by mouth 3 (three) times daily.    levothyroxine  (SYNTHROID ) 100 MCG tablet TAKE 1 TABLET(100 MCG) BY MOUTH DAILY    loratadine  (CLARITIN ) 10  MG tablet TAKE 1 TABLET (10 MG TOTAL) BY MOUTH DAILY.    losartan  (COZAAR ) 50 MG tablet TAKE 1 TABLET(50 MG) BY MOUTH DAILY    metoCLOPramide  (REGLAN ) 10 MG tablet Take 10 mg by mouth every 6 (six) hours as needed for nausea.    misoprostol (CYTOTEC) 200 MCG tablet Take 200 mcg by mouth.    NON FORMULARY 1 tablet by Mouth Rinse route daily. Bariatric multivitamin - Take one tablet by mouth daily    ondansetron  (ZOFRAN ) 8 MG tablet Take 1 tablet (8 mg total) by mouth every 8 (eight) hours as needed for nausea or vomiting.    pantoprazole  (PROTONIX ) 40 MG tablet Take 1 tablet (40 mg total) by mouth daily. Pt states taking BID    semaglutide -weight management (WEGOVY ) 0.25 MG/0.5ML SOAJ SQ injection Inject 0.25 mg into the skin once a week.    sucralfate  (CARAFATE ) 1 g tablet Take 1 tablet (1 g total) by mouth 4 (four) times daily.    sucralfate  (CARAFATE ) 1 GM/10ML suspension Take 10 mLs (1 g total) by mouth 4 (four) times daily -  with meals and at bedtime.    [DISCONTINUED] lipase/protease/amylase (CREON) 36000 UNITS CPEP capsule Take 2 capsules by mouth daily. Take 2 capsules with every meal 12/28/2024: by GI provider   No facility-administered encounter medications on file as of 12/28/2024.       Assessment & Plan:   Problem List Items Addressed This Visit     Acid reflux    Gastroesophageal reflux  disease Chronic gastroesophageal reflux disease managed with Protonix  40 mg daily. - Continue Protonix  40 mg daily. -Follow up with gastroenterology as previously scheduled        Adiposity   Chronic class I obesity BMI of 33. Interested in starting Wegovy  for weight management and potential benefits on elevated alkaline phosphatase levels. Discussed cost and insurance coverage issues. Potential side effects include constipation and nausea, which she is concerned about. Discussed possibility of using Wegovy  samples to assess tolerance. - Start Wegovy  0.25 mg weekly subcutaneously, patient given samples for 1 month  - f/u with weight loss specialist as scheduled       Relevant Medications   semaglutide -weight management (WEGOVY ) 0.25 MG/0.5ML SOAJ SQ injection   Arthritis   Chronic  Continue gabapentin  100mg  TID       B12 deficiency   Stable,chronic         BP (high blood pressure)   Chronic  Blood pressure elevated at 141/77 mmHg. Home readings generally around 140/72 mmHg. Current management includes losartan  50 mg daily. Discussed leeway in blood pressure targets after age 25. - Continue losartan  50 mg daily. - Monitor blood pressure at home.      Relevant Orders   CMP14+EGFR   Elevated alkaline phosphatase level   Chronic  Previous liver scan was normal. Discussed potential benefits of Wegovy  in reducing alkaline phosphatase levels. - Start Wegovy  0.25mg  weekly as discussed under chronic class I obesity.      Gastric ulcer without hemorrhage or perforation   Marginal ulcer post-gastric bypass Chronic  Marginal ulcer in the gastric pouch post-gastric bypass. Symptoms include nausea and early satiety. Currently managed with Carafate  and Protonix . Discussed potential revision surgery if symptoms persist. - Continue Carafate  1 gram tablets as needed. - Continue Protonix  40 mg daily. - Will discuss potential revision surgery with surgeon.      Hypercholesteremia    Chronic Due for updated lipid panel. Last cholesterol panel was in 2021. - Order  lipid panel - continue atorvastatin 40mg  daily       Relevant Orders   Lipid panel   Hypothyroidism, unspecified - Primary   Acquired hypothyroidism Chronic Managed with Synthroid  100 mcg daily. - Continue Synthroid  100 mcg daily. - Ordered thyroid panel.      Relevant Orders   TSH+T4F+T3Free   Postmenopausal estrogen deficiency   Relevant Orders   DG Bone Density   S/P gastric bypass   Relevant Medications   semaglutide -weight management (WEGOVY ) 0.25 MG/0.5ML SOAJ SQ injection   Vitamin D deficiency, unspecified   Chronic  Managed with calcium supplementation. - Continue calcium 1500 mg daily with 600 mg elemental calcium three times a day      Other Visit Diagnoses       Encounter for screening mammogram for malignant neoplasm of breast       Relevant Orders   MM 3D SCREENING MAMMOGRAM BILATERAL BREAST     Immunization due       Relevant Orders   Flu vaccine HIGH DOSE PF(Fluzone Trivalent) (Completed)       Assessment and Plan Assessment & Plan    General health maintenance Due for bone density scan and mammogram. Discussed importance of regular screenings. - Ordered bone density scan. - Ordered mammogram. - Ordered CMP, lipid panel, and thyroid panel.    Return in about 3 months (around 03/28/2025) for Chronic F/U, HTN.    Rockie Agent, MD [facility]  "

## 2024-12-28 NOTE — Assessment & Plan Note (Signed)
 Chronic  Continue gabapentin  100mg  TID

## 2024-12-28 NOTE — Assessment & Plan Note (Signed)
 Chronic Due for updated lipid panel. Last cholesterol panel was in 2021. - Order lipid panel - continue atorvastatin 40mg  daily

## 2024-12-29 ENCOUNTER — Ambulatory Visit: Payer: Self-pay | Admitting: Family Medicine

## 2024-12-29 LAB — LIPID PANEL
Chol/HDL Ratio: 2.5 ratio (ref 0.0–4.4)
Cholesterol, Total: 149 mg/dL (ref 100–199)
HDL: 59 mg/dL
LDL Chol Calc (NIH): 74 mg/dL (ref 0–99)
Triglycerides: 85 mg/dL (ref 0–149)
VLDL Cholesterol Cal: 16 mg/dL (ref 5–40)

## 2024-12-29 LAB — CMP14+EGFR
ALT: 12 IU/L (ref 0–32)
AST: 15 IU/L (ref 0–40)
Albumin: 4.1 g/dL (ref 3.9–4.9)
Alkaline Phosphatase: 143 IU/L — ABNORMAL HIGH (ref 49–135)
BUN/Creatinine Ratio: 25 (ref 12–28)
BUN: 12 mg/dL (ref 8–27)
Bilirubin Total: 0.6 mg/dL (ref 0.0–1.2)
CO2: 24 mmol/L (ref 20–29)
Calcium: 9.2 mg/dL (ref 8.7–10.3)
Chloride: 106 mmol/L (ref 96–106)
Creatinine, Ser: 0.48 mg/dL — ABNORMAL LOW (ref 0.57–1.00)
Globulin, Total: 2 g/dL (ref 1.5–4.5)
Glucose: 95 mg/dL (ref 70–99)
Potassium: 4.1 mmol/L (ref 3.5–5.2)
Sodium: 141 mmol/L (ref 134–144)
Total Protein: 6.1 g/dL (ref 6.0–8.5)
eGFR: 102 mL/min/1.73

## 2024-12-29 LAB — TSH+T4F+T3FREE
Free T4: 1.39 ng/dL (ref 0.82–1.77)
T3, Free: 3.2 pg/mL (ref 2.0–4.4)
TSH: 1.42 u[IU]/mL (ref 0.450–4.500)

## 2025-01-27 ENCOUNTER — Other Ambulatory Visit

## 2025-01-27 ENCOUNTER — Encounter

## 2025-04-06 ENCOUNTER — Ambulatory Visit: Admitting: Family Medicine
